# Patient Record
Sex: Male | Born: 1965
Health system: Southern US, Community
[De-identification: ages and names within clinical notes are randomized; demographics above are authoritative.]

## PROBLEM LIST (undated history)

## (undated) DIAGNOSIS — D696 Thrombocytopenia, unspecified: Secondary | ICD-10-CM

## (undated) DIAGNOSIS — M5416 Radiculopathy, lumbar region: Secondary | ICD-10-CM

## (undated) DIAGNOSIS — U071 COVID-19: Secondary | ICD-10-CM

## (undated) DIAGNOSIS — I77810 Thoracic aortic ectasia: Secondary | ICD-10-CM

## (undated) DIAGNOSIS — N138 Other obstructive and reflux uropathy: Secondary | ICD-10-CM

## (undated) DIAGNOSIS — M419 Scoliosis, unspecified: Secondary | ICD-10-CM

## (undated) DIAGNOSIS — C439 Malignant melanoma of skin, unspecified: Secondary | ICD-10-CM

## (undated) DIAGNOSIS — N401 Enlarged prostate with lower urinary tract symptoms: Secondary | ICD-10-CM

## (undated) DIAGNOSIS — E559 Vitamin D deficiency, unspecified: Secondary | ICD-10-CM

## (undated) DIAGNOSIS — R591 Generalized enlarged lymph nodes: Secondary | ICD-10-CM

## (undated) DIAGNOSIS — I493 Ventricular premature depolarization: Secondary | ICD-10-CM

## (undated) DIAGNOSIS — R0600 Dyspnea, unspecified: Secondary | ICD-10-CM

## (undated) DIAGNOSIS — E538 Deficiency of other specified B group vitamins: Secondary | ICD-10-CM

## (undated) DIAGNOSIS — R001 Bradycardia, unspecified: Secondary | ICD-10-CM

## (undated) DIAGNOSIS — R002 Palpitations: Secondary | ICD-10-CM

## (undated) HISTORY — PX: HERNIA REPAIR: SHX51

## (undated) HISTORY — PX: BACK SURGERY: SHX140

## (undated) HISTORY — PX: TONSILLECTOMY: SUR1361

## (undated) HISTORY — PX: MELANOMA EXCISION: SHX5266

## (undated) HISTORY — PX: LAMINECTOMY: SHX219

## (undated) HISTORY — DX: Malignant melanoma of skin, unspecified: C43.9

## (undated) HISTORY — PX: CATARACT EXTRACTION, BILATERAL: SHX1313

---

## 1993-06-16 DIAGNOSIS — C439 Malignant melanoma of skin, unspecified: Secondary | ICD-10-CM

## 1993-06-16 HISTORY — DX: Malignant melanoma of skin, unspecified: C43.9

## 2002-02-15 DIAGNOSIS — K219 Gastro-esophageal reflux disease without esophagitis: Secondary | ICD-10-CM | POA: Insufficient documentation

## 2003-01-26 ENCOUNTER — Ambulatory Visit (HOSPITAL_COMMUNITY): Admission: RE | Admit: 2003-01-26 | Discharge: 2003-01-27 | Payer: Self-pay | Admitting: Neurosurgery

## 2004-03-22 ENCOUNTER — Ambulatory Visit (HOSPITAL_COMMUNITY): Admission: RE | Admit: 2004-03-22 | Discharge: 2004-03-22 | Payer: Self-pay | Admitting: Neurosurgery

## 2004-03-28 ENCOUNTER — Ambulatory Visit (HOSPITAL_COMMUNITY): Admission: RE | Admit: 2004-03-28 | Discharge: 2004-03-29 | Payer: Self-pay | Admitting: Neurosurgery

## 2004-12-06 ENCOUNTER — Ambulatory Visit: Payer: Self-pay | Admitting: Unknown Physician Specialty

## 2006-07-09 DIAGNOSIS — D239 Other benign neoplasm of skin, unspecified: Secondary | ICD-10-CM

## 2006-07-09 HISTORY — DX: Other benign neoplasm of skin, unspecified: D23.9

## 2008-01-13 ENCOUNTER — Ambulatory Visit: Payer: Self-pay | Admitting: Unknown Physician Specialty

## 2013-04-27 LAB — HM COLONOSCOPY

## 2013-10-17 DIAGNOSIS — Z125 Encounter for screening for malignant neoplasm of prostate: Secondary | ICD-10-CM | POA: Insufficient documentation

## 2013-10-17 DIAGNOSIS — Z1211 Encounter for screening for malignant neoplasm of colon: Secondary | ICD-10-CM | POA: Insufficient documentation

## 2013-10-17 DIAGNOSIS — Z0000 Encounter for general adult medical examination without abnormal findings: Secondary | ICD-10-CM | POA: Insufficient documentation

## 2014-01-11 ENCOUNTER — Ambulatory Visit (INDEPENDENT_AMBULATORY_CARE_PROVIDER_SITE_OTHER): Payer: BC Managed Care – PPO

## 2014-01-11 ENCOUNTER — Encounter: Payer: Self-pay | Admitting: Podiatry

## 2014-01-11 ENCOUNTER — Ambulatory Visit (INDEPENDENT_AMBULATORY_CARE_PROVIDER_SITE_OTHER): Payer: BC Managed Care – PPO | Admitting: Podiatry

## 2014-01-11 VITALS — BP 135/65 | HR 60 | Resp 16 | Ht 76.0 in | Wt 194.0 lb

## 2014-01-11 DIAGNOSIS — M722 Plantar fascial fibromatosis: Secondary | ICD-10-CM

## 2014-01-11 MED ORDER — METHYLPREDNISOLONE (PAK) 4 MG PO TABS: ORAL_TABLET | ORAL | Status: DC

## 2014-01-11 MED ORDER — MELOXICAM 15 MG PO TABS
15.0000 mg | ORAL_TABLET | Freq: Every day | ORAL | Status: DC
Start: 2014-01-11 — End: 2014-05-01

## 2014-01-11 NOTE — Patient Instructions (Signed)
Plantar Fasciitis (Heel Spur Syndrome) with Rehab The plantar fascia is a fibrous, ligament-like, soft-tissue structure that spans the bottom of the foot. Plantar fasciitis is a condition that causes pain in the foot due to inflammation of the tissue. SYMPTOMS   Pain and tenderness on the underneath side of the foot.  Pain that worsens with standing or walking. CAUSES  Plantar fasciitis is caused by irritation and injury to the plantar fascia on the underneath side of the foot. Common mechanisms of injury include:  Direct trauma to bottom of the foot.  Damage to a small nerve that runs under the foot where the main fascia attaches to the heel bone.  Stress placed on the plantar fascia due to bone spurs. RISK INCREASES WITH:   Activities that place stress on the plantar fascia (running, jumping, pivoting, or cutting).  Poor strength and flexibility.  Improperly fitted shoes.  Tight calf muscles.  Flat feet.  Failure to warm-up properly before activity.  Obesity. PREVENTION  Warm up and stretch properly before activity.  Allow for adequate recovery between workouts.  Maintain physical fitness:  Strength, flexibility, and endurance.  Cardiovascular fitness.  Maintain a health body weight.  Avoid stress on the plantar fascia.  Wear properly fitted shoes, including arch supports for individuals who have flat feet. PROGNOSIS  If treated properly, then the symptoms of plantar fasciitis usually resolve without surgery. However, occasionally surgery is necessary. RELATED COMPLICATIONS   Recurrent symptoms that may result in a chronic condition.  Problems of the lower back that are caused by compensating for the injury, such as limping.  Pain or weakness of the foot during push-off following surgery.  Chronic inflammation, scarring, and partial or complete fascia tear, occurring more often from repeated injections. TREATMENT  Treatment initially involves the use of  ice and medication to help reduce pain and inflammation. The use of strengthening and stretching exercises may help reduce pain with activity, especially stretches of the Achilles tendon. These exercises may be performed at home or with a therapist. Your caregiver may recommend that you use heel cups of arch supports to help reduce stress on the plantar fascia. Occasionally, corticosteroid injections are given to reduce inflammation. If symptoms persist for greater than 6 months despite non-surgical (conservative), then surgery may be recommended.  MEDICATION   If pain medication is necessary, then nonsteroidal anti-inflammatory medications, such as aspirin and ibuprofen, or other minor pain relievers, such as acetaminophen, are often recommended.  Do not take pain medication within 7 days before surgery.  Prescription pain relievers may be given if deemed necessary by your caregiver. Use only as directed and only as much as you need.  Corticosteroid injections may be given by your caregiver. These injections should be reserved for the most serious cases, because they may only be given a certain number of times. HEAT AND COLD  Cold treatment (icing) relieves pain and reduces inflammation. Cold treatment should be applied for 10 to 15 minutes every 2 to 3 hours for inflammation and pain and immediately after any activity that aggravates your symptoms. Use ice packs or massage the area with a piece of ice (ice massage).  Heat treatment may be used prior to performing the stretching and strengthening activities prescribed by your caregiver, physical therapist, or athletic trainer. Use a heat pack or soak the injury in warm water. SEEK IMMEDIATE MEDICAL CARE IF:  Treatment seems to offer no benefit, or the condition worsens.  Any medications produce adverse side effects. EXERCISES RANGE   OF MOTION (ROM) AND STRETCHING EXERCISES - Plantar Fasciitis (Heel Spur Syndrome) These exercises may help you  when beginning to rehabilitate your injury. Your symptoms may resolve with or without further involvement from your physician, physical therapist or athletic trainer. While completing these exercises, remember:   Restoring tissue flexibility helps normal motion to return to the joints. This allows healthier, less painful movement and activity.  An effective stretch should be held for at least 30 seconds.  A stretch should never be painful. You should only feel a gentle lengthening or release in the stretched tissue. RANGE OF MOTION - Toe Extension, Flexion  Sit with your right / left leg crossed over your opposite knee.  Grasp your toes and gently pull them back toward the top of your foot. You should feel a stretch on the bottom of your toes and/or foot.  Hold this stretch for __________ seconds.  Now, gently pull your toes toward the bottom of your foot. You should feel a stretch on the top of your toes and or foot.  Hold this stretch for __________ seconds. Repeat __________ times. Complete this stretch __________ times per day.  RANGE OF MOTION - Ankle Dorsiflexion, Active Assisted  Remove shoes and sit on a chair that is preferably not on a carpeted surface.  Place right / left foot under knee. Extend your opposite leg for support.  Keeping your heel down, slide your right / left foot back toward the chair until you feel a stretch at your ankle or calf. If you do not feel a stretch, slide your bottom forward to the edge of the chair, while still keeping your heel down.  Hold this stretch for __________ seconds. Repeat __________ times. Complete this stretch __________ times per day.  STRETCH - Gastroc, Standing  Place hands on wall.  Extend right / left leg, keeping the front knee somewhat bent.  Slightly point your toes inward on your back foot.  Keeping your right / left heel on the floor and your knee straight, shift your weight toward the wall, not allowing your back to  arch.  You should feel a gentle stretch in the right / left calf. Hold this position for __________ seconds. Repeat __________ times. Complete this stretch __________ times per day. STRETCH - Soleus, Standing  Place hands on wall.  Extend right / left leg, keeping the other knee somewhat bent.  Slightly point your toes inward on your back foot.  Keep your right / left heel on the floor, bend your back knee, and slightly shift your weight over the back leg so that you feel a gentle stretch deep in your back calf.  Hold this position for __________ seconds. Repeat __________ times. Complete this stretch __________ times per day. STRETCH - Gastrocsoleus, Standing  Note: This exercise can place a lot of stress on your foot and ankle. Please complete this exercise only if specifically instructed by your caregiver.   Place the ball of your right / left foot on a step, keeping your other foot firmly on the same step.  Hold on to the wall or a rail for balance.  Slowly lift your other foot, allowing your body weight to press your heel down over the edge of the step.  You should feel a stretch in your right / left calf.  Hold this position for __________ seconds.  Repeat this exercise with a slight bend in your right / left knee. Repeat __________ times. Complete this stretch __________ times per day.    STRENGTHENING EXERCISES - Plantar Fasciitis (Heel Spur Syndrome)  These exercises may help you when beginning to rehabilitate your injury. They may resolve your symptoms with or without further involvement from your physician, physical therapist or athletic trainer. While completing these exercises, remember:   Muscles can gain both the endurance and the strength needed for everyday activities through controlled exercises.  Complete these exercises as instructed by your physician, physical therapist or athletic trainer. Progress the resistance and repetitions only as guided. STRENGTH -  Towel Curls  Sit in a chair positioned on a non-carpeted surface.  Place your foot on a towel, keeping your heel on the floor.  Pull the towel toward your heel by only curling your toes. Keep your heel on the floor.  If instructed by your physician, physical therapist or athletic trainer, add ____________________ at the end of the towel. Repeat __________ times. Complete this exercise __________ times per day. STRENGTH - Ankle Inversion  Secure one end of a rubber exercise band/tubing to a fixed object (table, pole). Loop the other end around your foot just before your toes.  Place your fists between your knees. This will focus your strengthening at your ankle.  Slowly, pull your big toe up and in, making sure the band/tubing is positioned to resist the entire motion.  Hold this position for __________ seconds.  Have your muscles resist the band/tubing as it slowly pulls your foot back to the starting position. Repeat __________ times. Complete this exercises __________ times per day.  Document Released: 06/02/2005 Document Revised: 08/25/2011 Document Reviewed: 09/14/2008 [employer] Patient Information 2015 [employer]. This information is not intended to replace advice given to you by your health care provider. Make sure you discuss any questions you have with your health care provider. Plantar Fasciitis Plantar fasciitis is a common condition that causes foot pain. It is soreness (inflammation) of the band of tough fibrous tissue on the bottom of the foot that runs from the heel bone (calcaneus) to the ball of the foot. The cause of this soreness may be from excessive standing, poor fitting shoes, running on hard surfaces, being overweight, having an abnormal walk, or overuse (this is common in runners) of the painful foot or feet. It is also common in aerobic exercise dancers and ballet dancers. SYMPTOMS  Most people with plantar fasciitis complain of:  Severe pain in the morning  on the bottom of their foot especially when taking the first steps out of bed. This pain recedes after a few minutes of walking.  Severe pain is experienced also during walking following a long period of inactivity.  Pain is worse when walking barefoot or up stairs DIAGNOSIS   Your caregiver will diagnose this condition by examining and feeling your foot.  Special tests such as X-rays of your foot, are usually not needed. PREVENTION   Consult a sports medicine professional before beginning a new exercise program.  Walking programs offer a good workout. With walking there is a lower chance of overuse injuries common to runners. There is less impact and less jarring of the joints.  Begin all new exercise programs slowly. If problems or pain develop, decrease the amount of time or distance until you are at a comfortable level.  Wear good shoes and replace them regularly.  Stretch your foot and the heel cords at the back of the ankle (Achilles tendon) both before and after exercise.  Run or exercise on even surfaces that are not hard. For example, asphalt is better than   pavement.  Do not run barefoot on hard surfaces.  If using a treadmill, vary the incline.  Do not continue to workout if you have foot or joint problems. Seek professional help if they do not improve. HOME CARE INSTRUCTIONS   Avoid activities that cause you pain until you recover.  Use ice or cold packs on the problem or painful areas after working out.  Only take over-the-counter or prescription medicines for pain, discomfort, or fever as directed by your caregiver.  Soft shoe inserts or athletic shoes with air or gel sole cushions may be helpful.  If problems continue or become more severe, consult a sports medicine caregiver or your own health care provider. Cortisone is a potent anti-inflammatory medication that may be injected into the painful area. You can discuss this treatment with your caregiver. MAKE SURE  YOU:   Understand these instructions.  Will watch your condition.  Will get help right away if you are not doing well or get worse. Document Released: 02/25/2001 Document Revised: 08/25/2011 Document Reviewed: 04/26/2008 [employer] Patient Information 2015 [employer]. This information is not intended to replace advice given to you by your health care provider. Make sure you discuss any questions you have with your health care provider.  

## 2014-01-11 NOTE — Progress Notes (Signed)
   Subjective:    Patient ID: Paul Pruitt, male    DOB: [birth year], 48 y.o.   MRN: [id number]  HPI Comments: i have right heel pain. i have had it for 1 year off and on. Its getting worse. i hurts when exercising. i take ibuprofen for the pain. i went to urgent care on 6.7.15 and they took x-rays, said i have plantar fasciitis. They told me to get new shoes, take it easy for a while and no running on the treadmill. i massage my foot with a tennis ball.   Foot Pain      Review of Systems  All other systems reviewed and are negative.      Objective:   Physical Exam: I have reviewed his past medical history medications allergies surgeries social history and review of systems. Pulses are strongly palpable bilateral. Neurologic sensorium is intact per since once the monofilament. Deep tendon reflexes are intact bilateral and muscle strength is 5 over 5 dorsiflexors plantar flexors inverters everters all intrinsic musculature is intact. Orthopedic evaluation demonstrates all joints distal to the ankle a full range of motion without crepitation. He has pain on palpation medial continued tubercle of the right heel. Radiographic evaluation demonstrates soft tissue increase in density at the plantar fascial calcaneal insertion site indicative of plantar fasciitis. Cutaneous evaluation demonstrates supple while hydrated cutis no erythema edema cellulitis drainage or odor.        Assessment & Plan:  Assessment: Plantar fasciitis right foot.  Plan: Discussed etiology pathology conservative versus surgical therapies. At this point started him a Medrol Dosepak to be followed by meloxicam. Injected his right heel with Kenalog and local anesthetic. Put him in a plantar fascial strapping and a night splint. Discussed appropriate shoe gear stretching exercises ice therapy and shoe gear modifications followup with him in one month.

## 2014-02-06 ENCOUNTER — Ambulatory Visit (INDEPENDENT_AMBULATORY_CARE_PROVIDER_SITE_OTHER): Payer: BC Managed Care – PPO | Admitting: Podiatry

## 2014-02-06 VITALS — BP 125/72 | HR 73 | Resp 16

## 2014-02-06 DIAGNOSIS — M722 Plantar fascial fibromatosis: Secondary | ICD-10-CM

## 2014-02-06 NOTE — Progress Notes (Signed)
He presents today for followup of his plantar fasciitis of his right heel. He states it is anywhere from 60-90% better depending a one-day you ask me. He is very eager to get back to his running and training for marathons is coming up in the spring. He states that he is continuing all of his conservative therapy a regular basis. He states that he is not cheating.  Objective: Pain on palpation medial continued tubercle of the right heel.  Assessment: Plantar fasciitis right.  Plan: Reinjected today with Kenalog and local anesthetic. Dispensed another fascial brace. He was scanned for a pair orthotics. And he will continue all other conservative therapies.

## 2014-02-23 ENCOUNTER — Encounter: Payer: Self-pay | Admitting: *Deleted

## 2014-02-23 NOTE — Progress Notes (Signed)
Called and left message and sent pt post card letting him know orthotics are here.

## 2014-02-27 ENCOUNTER — Encounter: Payer: Self-pay | Admitting: Podiatry

## 2014-02-27 ENCOUNTER — Ambulatory Visit (INDEPENDENT_AMBULATORY_CARE_PROVIDER_SITE_OTHER): Payer: BC Managed Care – PPO | Admitting: *Deleted

## 2014-02-27 VITALS — BP 125/72 | HR 73 | Resp 16

## 2014-02-27 DIAGNOSIS — M722 Plantar fascial fibromatosis: Secondary | ICD-10-CM

## 2014-02-27 NOTE — Progress Notes (Signed)
Pt presents to pick up orthotics. Went over wearing instructions.

## 2014-02-27 NOTE — Patient Instructions (Signed)

## 2014-04-03 ENCOUNTER — Ambulatory Visit: Payer: BC Managed Care – PPO | Admitting: Podiatry

## 2014-05-01 ENCOUNTER — Ambulatory Visit (INDEPENDENT_AMBULATORY_CARE_PROVIDER_SITE_OTHER): Payer: BC Managed Care – PPO | Admitting: Podiatry

## 2014-05-01 ENCOUNTER — Ambulatory Visit (INDEPENDENT_AMBULATORY_CARE_PROVIDER_SITE_OTHER): Payer: BC Managed Care – PPO

## 2014-05-01 VITALS — BP 125/72 | HR 66 | Resp 16

## 2014-05-01 DIAGNOSIS — M779 Enthesopathy, unspecified: Secondary | ICD-10-CM

## 2014-05-01 MED ORDER — MELOXICAM 15 MG PO TABS: 15.0000 mg | ORAL_TABLET | Freq: Every day | ORAL | Status: DC

## 2014-05-01 NOTE — Progress Notes (Signed)
Paul Pruitt presents today with chief complaint of pain to the dorsal aspect of his right foot. He states it is been bothering him for the past few weeks seems to be worse with shoes after his been running.  Objective: Vital signs are stable he is alert and oriented 3 pulses are strongly palpable bilateral lower extremity. Mild area of erythema without an increase in temperature overlying the extensor hallucis longus tendon over a prominent first metatarsal base. Radiographs confirmed this and he does have tenderness on dorsiflexion against resistance.  Assessment: EHL tendinitis dorsal aspect right foot.  Plan: Injected with dexamethasone. Discussed appropriate shoe gear stretching exercises and shoe gear modifications discussed ice therapy.

## 2014-11-21 DIAGNOSIS — E039 Hypothyroidism, unspecified: Secondary | ICD-10-CM

## 2014-11-21 DIAGNOSIS — Z860100 Personal history of colon polyps, unspecified: Secondary | ICD-10-CM | POA: Insufficient documentation

## 2014-11-21 DIAGNOSIS — R079 Chest pain, unspecified: Secondary | ICD-10-CM

## 2014-11-21 DIAGNOSIS — E559 Vitamin D deficiency, unspecified: Secondary | ICD-10-CM

## 2014-11-21 DIAGNOSIS — R351 Nocturia: Secondary | ICD-10-CM

## 2014-11-21 DIAGNOSIS — F419 Anxiety disorder, unspecified: Secondary | ICD-10-CM | POA: Insufficient documentation

## 2014-11-22 ENCOUNTER — Encounter: Payer: Self-pay | Admitting: Physician Assistant

## 2014-11-22 ENCOUNTER — Other Ambulatory Visit: Payer: Self-pay

## 2014-11-22 ENCOUNTER — Ambulatory Visit (INDEPENDENT_AMBULATORY_CARE_PROVIDER_SITE_OTHER): Payer: BLUE CROSS/BLUE SHIELD | Admitting: Physician Assistant

## 2014-11-22 VITALS — BP 122/78 | HR 54 | Temp 98.0°F | Resp 14 | Wt 195.8 lb

## 2014-11-22 DIAGNOSIS — R350 Frequency of micturition: Secondary | ICD-10-CM | POA: Diagnosis not present

## 2014-11-22 DIAGNOSIS — N410 Acute prostatitis: Secondary | ICD-10-CM | POA: Diagnosis not present

## 2014-11-22 LAB — POCT URINALYSIS DIPSTICK
Bilirubin, UA: NEGATIVE
Blood, UA: NEGATIVE
Glucose, UA: NEGATIVE
Ketones, UA: NEGATIVE
Leukocytes, UA: NEGATIVE
Nitrite, UA: NEGATIVE
Protein, UA: NEGATIVE
Spec Grav, UA: 1.015
Urobilinogen, UA: 0.2
pH, UA: 7

## 2014-11-22 MED ORDER — DOXYCYCLINE HYCLATE 100 MG PO TABS: 100.0000 mg | ORAL_TABLET | Freq: Two times a day (BID) | ORAL | Status: DC

## 2014-11-22 NOTE — Patient Instructions (Signed)

## 2014-11-22 NOTE — Progress Notes (Signed)
   Subjective:    Patient ID: Paul Pruitt, male    DOB: 09-03-1965, 49 y.o.   MRN: [id number]  Urinary Frequency  This is a recurrent problem. The current episode started in the past 7 days. The problem occurs intermittently. The problem has been gradually improving. The quality of the pain is described as burning. The pain is at a severity of 0/10. The patient is experiencing no pain. There has been no fever. He is sexually active. There is no history of pyelonephritis. Associated symptoms include frequency and hesitancy. Pertinent negatives include no chills, discharge, flank pain, hematuria, nausea, possible pregnancy, sweats, urgency or vomiting. He has tried nothing for the symptoms. There is no history of catheterization, kidney stones, recurrent UTIs, a single kidney, urinary stasis or a urological procedure. h/o prostatitis in 07/2014      Review of Systems  Constitutional: Negative for fever and chills.  Gastrointestinal: Negative for nausea and vomiting.  Genitourinary: Positive for hesitancy, frequency and difficulty urinating (hesitant stream and low flow). Negative for dysuria, urgency, hematuria, flank pain, discharge, penile swelling, scrotal swelling, genital sores, penile pain and testicular pain.       Objective:   Physical Exam  Constitutional: He appears well-developed and well-nourished. No distress.  Cardiovascular: Normal rate, regular rhythm and normal heart sounds.  Exam reveals no gallop and no friction rub.   No murmur heard. Pulmonary/Chest: Effort normal and breath sounds normal. No respiratory distress. He has no wheezes. He has no rales. He exhibits no tenderness.  Genitourinary:  Pt refused DRE stating he had physical in May and everything was ok and had his PSA checked as well.  Skin: Skin is warm and dry.          Assessment & Plan:  1. Frequency of urination  - POCT urinalysis dipstick  2. Acute prostatitis without hematuria Recurrent.   Previously noted in 07/2014 and treated with doxycycline with improvement.  He does report nocturia on a regular basis with having to get up once per night, but recently he has been getting up 3-4 times per night.  He states symptoms are exact same as 07/2014.  Discussed possible BPH and treatment for that.  We also discussed urology referral for peak flow testing and PVR testing.  He would like to hold off on those treatments and recommendations at this time unless symptoms recur or do not improve with treatment this time.  - doxycycline (VIBRA-TABS) 100 MG tablet; Take 1 tablet (100 mg total) by mouth 2 (two) times daily.  Dispense: 20 tablet; Refill: 0

## 2014-12-01 ENCOUNTER — Ambulatory Visit (INDEPENDENT_AMBULATORY_CARE_PROVIDER_SITE_OTHER): Payer: BLUE CROSS/BLUE SHIELD | Admitting: Physician Assistant

## 2014-12-01 ENCOUNTER — Encounter: Payer: Self-pay | Admitting: Physician Assistant

## 2014-12-01 VITALS — BP 104/62 | HR 58 | Temp 97.9°F | Resp 14 | Wt 196.4 lb

## 2014-12-01 DIAGNOSIS — R351 Nocturia: Secondary | ICD-10-CM | POA: Diagnosis not present

## 2014-12-01 DIAGNOSIS — N50819 Testicular pain, unspecified: Secondary | ICD-10-CM

## 2014-12-01 DIAGNOSIS — R3919 Other difficulties with micturition: Secondary | ICD-10-CM | POA: Diagnosis not present

## 2014-12-01 DIAGNOSIS — N508 Other specified disorders of male genital organs: Secondary | ICD-10-CM

## 2014-12-01 DIAGNOSIS — R3911 Hesitancy of micturition: Secondary | ICD-10-CM

## 2014-12-01 DIAGNOSIS — R39198 Other difficulties with micturition: Secondary | ICD-10-CM

## 2014-12-01 NOTE — Patient Instructions (Signed)
Benign Prostatic Hyperplasia An enlarged prostate (benign prostatic hyperplasia) is common in older men. You may experience the following:  Weak urine stream.  Dribbling.  Feeling like the bladder has not emptied completely.  Difficulty starting urination.  Getting up frequently at night to urinate.  Urinating more frequently during the day. HOME CARE INSTRUCTIONS  Monitor your prostatic hyperplasia for any changes. The following actions may help to alleviate any discomfort you are experiencing:  Give yourself time when you urinate.  Stay away from alcohol.  Avoid beverages containing caffeine, such as coffee, tea, and colas, because they can make the problem worse.  Avoid decongestants, antihistamines, and some prescription medicines that can make the problem worse.  Follow up with your health care provider for further treatment as recommended. SEEK MEDICAL CARE IF:  You are experiencing progressive difficulty voiding.  Your urine stream is progressively getting narrower.  You are awaking from sleep with the urge to void more frequently.  You are constantly feeling the need to void.  You experience loss of urine, especially in small amounts. SEEK IMMEDIATE MEDICAL CARE IF:   You develop increased pain with urination or are unable to urinate.  You develop severe abdominal pain, vomiting, a high fever, or fainting.  You develop back pain or blood in your urine. MAKE SURE YOU:   Understand these instructions.  Will watch your condition.  Will get help right away if you are not doing well or get worse. Document Released: 06/02/2005 Document Revised: 02/02/2013 Document Reviewed: 11/02/2012 [employer] Patient Information 2015 [employer]. This information is not intended to replace advice given to you by your health care provider. Make sure you discuss any questions you have with your health care provider.  

## 2014-12-01 NOTE — Progress Notes (Signed)
   Subjective:    Patient ID: Paul Pruitt, male    DOB: Jul 31, 1965, 49 y.o.   MRN: [id number]  HPI Comments: On Monday he attended an exercise class and states that they had to do a lot of jumping jacks.  His testicles became sore after that.  Wednesday he states was the worst day, but today it is not as bad and he was even able to go on his morning run today.  Testicle Pain The patient's primary symptoms include testicular pain. The patient's pertinent negatives include no genital injury, genital itching, genital lesions, penile discharge, penile pain, priapism or scrotal swelling. This is a new problem. The current episode started in the past 7 days. The problem occurs constantly. The problem has been gradually improving. The pain is mild. Associated symptoms include frequency and hesitancy. Pertinent negatives include no abdominal pain, anorexia, chest pain, chills, constipation, coughing, diarrhea, discolored urine, dysuria, fever, flank pain, headaches, hematuria, joint pain, joint swelling, nausea, painful intercourse, rash, shortness of breath, sore throat, urgency, urinary retention or vomiting. The testicular pain affects both testicles. The color of the testicles is normal. The symptoms are aggravated by activity. He has tried rest for the symptoms. The treatment provided mild relief. He is sexually active. He never uses condoms. No, his partner does not have an STD. His past medical history is significant for prostatitis. There is no history of BPH, chlamydia, cryptorchidism, erectile aid use, erectile dysfunction, a femoral hernia, gonorrhea, herpes simplex, HIV, an inguinal hernia, kidney stones, sickle cell disease, syphilis or varicocele.      Review of Systems  Constitutional: Negative for fever and chills.  HENT: Negative for sore throat.   Respiratory: Negative for cough and shortness of breath.   Cardiovascular: Negative for chest pain.  Gastrointestinal: Negative for nausea,  vomiting, abdominal pain, diarrhea, constipation and anorexia.  Genitourinary: Positive for hesitancy, frequency and testicular pain. Negative for dysuria, urgency, flank pain, discharge, scrotal swelling and penile pain.  Musculoskeletal: Negative for joint pain.  Skin: Negative for rash.  Neurological: Negative for headaches.       Objective:   Physical Exam  Constitutional: He appears well-developed and well-nourished. No distress.  Skin: He is not diaphoretic.  Vitals reviewed.         Assessment & Plan:  1. Testicular pain Most likely secondary from the jumping without support.  Advised to rest, continue compression shorts, moist heat, and ibuprofen prn.  If no improvement he is to call the office.  2. Nocturia Had thought it was subacute prostatitis as he has had this before and was successfully treated with doxycycline.  But symptoms are persistent.  BPH is highly suspected but patient states he had physical one month ago and was told everything was fine.  Would like referral for further evaluation. - Ambulatory referral to Urology  3. Urinary hesitancy See plan for nocturia. - Ambulatory referral to Urology  4. Decreased urine stream See plan for nocturia. - Ambulatory referral to Urology

## 2014-12-21 ENCOUNTER — Encounter: Payer: Self-pay | Admitting: Urology

## 2014-12-21 ENCOUNTER — Ambulatory Visit (INDEPENDENT_AMBULATORY_CARE_PROVIDER_SITE_OTHER): Payer: BLUE CROSS/BLUE SHIELD | Admitting: Urology

## 2014-12-21 VITALS — BP 108/65 | HR 55 | Ht 76.0 in | Wt 195.2 lb

## 2014-12-21 DIAGNOSIS — N50811 Right testicular pain: Secondary | ICD-10-CM

## 2014-12-21 DIAGNOSIS — N508 Other specified disorders of male genital organs: Secondary | ICD-10-CM

## 2014-12-21 DIAGNOSIS — N401 Enlarged prostate with lower urinary tract symptoms: Secondary | ICD-10-CM | POA: Diagnosis not present

## 2014-12-21 DIAGNOSIS — N138 Other obstructive and reflux uropathy: Secondary | ICD-10-CM

## 2014-12-21 LAB — URINALYSIS, COMPLETE
Bilirubin, UA: NEGATIVE
Glucose, UA: NEGATIVE
Ketones, UA: NEGATIVE
Leukocytes, UA: NEGATIVE
Nitrite, UA: NEGATIVE
Protein, UA: NEGATIVE
RBC, UA: NEGATIVE
Specific Gravity, UA: 1.025 (ref 1.005–1.030)
Urobilinogen, Ur: 0.2 mg/dL (ref 0.2–1.0)
pH, UA: 6 (ref 5.0–7.5)

## 2014-12-21 LAB — MICROSCOPIC EXAMINATION: Bacteria, UA: NONE SEEN

## 2014-12-21 LAB — BLADDER SCAN AMB NON-IMAGING: Scan Result: 0

## 2014-12-21 NOTE — Progress Notes (Signed)
12/21/2014 11:13 AM   Paul Pruitt [birth year] [id number]  Referring provider: Jerrol Pruitt., MD [address] [place], [place] [address]  Chief Complaint  Patient presents with  . Nocturia    referred from Dr. Dimple Pruitt [facility]  . Urinary hesitancy  . Testicle Pain    HPI: Mr. Paul Pruitt is a 49 year old white male who is heard by his PCP, Dr. Rosanna Pruitt, for urinary hesitancy and right testicular pain.  Patient states since the beginning of the year he's had 2 episodes of right testicular pain. He does not like to describe it as a pain, but more like an increased sensitivity or discomfort.  The pain occurs mostly after intense exercise. He does usually wear supportive  underwear and that does help with the discomfort. He has not noticed any scrotal swelling or masses. He does not have a history of scrotal trauma, scrotal surgery or infection in the epididymis or testicles.  He is not having pain with ejaculation.  He is in a monogamous relationship.  He has also had a long time symptoms of frequent urination, nocturia, hesitancy and a weak urinary stream. His 3 fluids are water, milk and coffee. He mostly drinks coffee, black. He has not had any gross hematuria, suprapubic pain, dysuria or penile discharge. He does admit to drinking a fair amount of fluids in the evening. He did stop drinking fluids after 6:00 for a time, but he saw no improvement in his nocturia. Sometimes,  he wakes up 3 times a night.  He is not always awoken by the urge to urinate, sometimes his dogs wake him up or other events.  His PVR is 0 mL.  His UA is unremarkable on today's exam.   He has had 2 episodes of prostatitis which  responded to doxycycline.  He does admit to having a very stressful life, but he exercises frequently and this helps relieve most of his stress.   PMH: No past medical history on file.  Surgical History: Past Surgical History  Procedure Laterality Date    . Hernia repair    . Laminectomy      x 2  . Tonsillectomy    . Melanoma excision      chest area    Home Medications:    Medication List       This list is accurate as of: 12/21/14 11:13 AM.  Always use your most recent med list.               doxycycline 100 MG tablet  Commonly known as:  VIBRA-TABS  Take 1 tablet (100 mg total) by mouth 2 (two) times daily.     ibuprofen 800 MG tablet  Commonly known as:  ADVIL,MOTRIN     levothyroxine 25 MCG tablet  Commonly known as:  SYNTHROID, LEVOTHROID     MULTIVITAMIN & MINERAL Liqd  Take by mouth.     OMEGA-3 FATTY ACIDS PO  Take 2 capsules by mouth daily.        Allergies: No Known Allergies  Family History: Family History  Problem Relation Age of Onset  . Arthritis Mother   . Colon cancer Mother   . Hypertension Father   . Asthma Father   . Hypertension Brother   . Obesity Brother   . Asthma Son   . Obesity Son   . Kidney disease Neg Hx   . Prostate cancer Neg Hx     Social History:  reports  that he has never smoked. He does not have any smokeless tobacco history on file. He reports that he drinks alcohol. He reports that he does not use illicit drugs.  ROS: Urological Symptom Review  Patient is experiencing the following symptoms: Frequent urination Get up at night to urinate Trouble starting stream Weak stream   Review of Systems  Gastrointestinal (upper)  : Negative for upper GI symptoms  Gastrointestinal (lower) : Negative for lower GI symptoms  Constitutional : Negative for symptoms  Skin: Negative for skin symptoms  Eyes: Negative for eye symptoms  Ear/Nose/Throat : Negative for Ear/Nose/Throat symptoms  Hematologic/Lymphatic: Negative for Hematologic/Lymphatic symptoms  Cardiovascular : Negative for cardiovascular symptoms  Respiratory : Shortness of breath  Endocrine: Negative for endocrine symptoms  Musculoskeletal: Back  pain  Neurological: Headaches  Psychologic: Negative for psychiatric symptoms   Physical Exam: BP 108/65 mmHg  Pulse 55  Ht 6\' 4"  (1.93 m)  Wt 195 lb 3.2 oz (88.542 kg)  BMI 23.77 kg/m2  Constitutional:  Alert and oriented, No acute distress. HEENT: [place] AT, moist mucus membranes.  Trachea midline, no masses. Cardiovascular: No clubbing, cyanosis, or edema. Respiratory: Normal respiratory effort, no increased work of breathing. GI: Abdomen is soft, nontender, nondistended, no abdominal masses GU: Patient with circumcised phallus.   Urethral meatus is patent.  No penile discharge. No penile lesions or rashes. Scrotum without lesions, cysts, rashes and/or edema.  Testicles are located scrotally bilaterally. No masses are appreciated in the testicles. Left and right epididymis are normal.  Left varicocele noted. Rectal: Patient with  normal sphincter tone. Perineum without scarring or rashes. No rectal masses are appreciated. Prostate is approximately 50 grams, no nodules are appreciated. Seminal vesicles are normal. Skin: No rashes, bruises or suspicious lesions. Lymph: No cervical or inguinal adenopathy. Neurologic: Grossly intact, no focal deficits, moving all 4 extremities. Psychiatric: Normal mood and affect.  Laboratory Data: Results for orders placed or performed in visit on 12/21/14  Microscopic Examination  Result Value Ref Range   WBC, UA 0-5 0 -  5 /hpf   RBC, UA 0-2 0 -  2 /hpf   Epithelial Cells (non renal) 0-10 0 - 10 /hpf   Bacteria, UA None seen None seen/Few  Urinalysis, Complete  Result Value Ref Range   Specific Gravity, UA 1.025 1.005 - 1.030   pH, UA 6.0 5.0 - 7.5   Color, UA Yellow Yellow   Appearance Ur Clear Clear   Leukocytes, UA Negative Negative   Protein, UA Negative Negative/Trace   Glucose, UA Negative Negative   Ketones, UA Negative Negative   RBC, UA Negative Negative   Bilirubin, UA Negative Negative   Urobilinogen, Ur 0.2 0.2 - 1.0 mg/dL    Nitrite, UA Negative Negative   Microscopic Examination See below:   BLADDER SCAN AMB NON-IMAGING  Result Value Ref Range   Scan Result 0    No results found for: WBC, HGB, HCT, MCV, PLT  No results found for: CREATININE  No results found for: PSA  No results found for: TESTOSTERONE  No results found for: HGBA1C  Urinalysis    Component Value Date/Time   BILIRUBINUR neg 11/22/2014 1018   PROTEINUR neg 11/22/2014 1018   UROBILINOGEN 0.2 11/22/2014 1018   NITRITE neg 11/22/2014 1018   LEUKOCYTESUR Negative 11/22/2014 1018    Pertinent Imaging:   Assessment & Plan:    1. BPH with LUTS:   We discussed starting the medication Flomax for his urinary symptoms. We also discussed  the side effects of that medication. He would like to postpone starting this medication at this time. He does not believe he does not believe his urinary symptoms are bad enough to risk the possible ejaculatory disorders associated with the Flomax. He will continue to address his urinary symptoms with behavioral and dietary changes.    - Urinalysis, Complete - BLADDER SCAN AMB NON-IMAGING  2. Right testicular pain:   Patient was found to have a left varicocele on exam. His pain is located in the right testicle. We will schedule scrotal ultrasound for further evaluation.  There may be a small right varicocele that could not be appreciated on the exam.  He will RTC for results.     No Follow-up on file.  Zara Council, [facility] [address] [place], [place] [address] [phone]

## 2014-12-26 ENCOUNTER — Ambulatory Visit
Admission: RE | Admit: 2014-12-26 | Discharge: 2014-12-26 | Disposition: A | Discharge status: Home / Self Care | Payer: BLUE CROSS/BLUE SHIELD | Source: Ambulatory Visit | Attending: Urology | Admitting: Urology

## 2014-12-26 DIAGNOSIS — N503 Cyst of epididymis: Secondary | ICD-10-CM | POA: Diagnosis not present

## 2014-12-26 DIAGNOSIS — N50811 Right testicular pain: Secondary | ICD-10-CM

## 2014-12-26 DIAGNOSIS — N508 Other specified disorders of male genital organs: Secondary | ICD-10-CM | POA: Diagnosis present

## 2014-12-26 DIAGNOSIS — N433 Hydrocele, unspecified: Secondary | ICD-10-CM | POA: Diagnosis not present

## 2015-01-04 ENCOUNTER — Encounter: Payer: Self-pay | Admitting: Urology

## 2015-01-04 ENCOUNTER — Ambulatory Visit (INDEPENDENT_AMBULATORY_CARE_PROVIDER_SITE_OTHER): Payer: BLUE CROSS/BLUE SHIELD | Admitting: Urology

## 2015-01-04 VITALS — BP 128/72 | HR 50 | Ht 76.0 in | Wt 197.3 lb

## 2015-01-04 DIAGNOSIS — R350 Frequency of micturition: Secondary | ICD-10-CM | POA: Diagnosis not present

## 2015-01-04 DIAGNOSIS — N410 Acute prostatitis: Secondary | ICD-10-CM | POA: Diagnosis not present

## 2015-01-04 LAB — BLADDER SCAN AMB NON-IMAGING: Scan Result: 57

## 2015-01-04 MED ORDER — DOXYCYCLINE HYCLATE 100 MG PO TABS: 100.0000 mg | ORAL_TABLET | Freq: Two times a day (BID) | ORAL | Status: DC

## 2015-01-04 MED ORDER — IBUPROFEN 800 MG PO TABS: 800.0000 mg | ORAL_TABLET | Freq: Three times a day (TID) | ORAL | Status: DC | PRN

## 2015-01-04 NOTE — Progress Notes (Signed)
01/04/2015 1:02 PM   Paul Pruitt 04/19/1966 [id number]  Referring provider: Jerrol Banana., MD [address] [place], [place] [address]  Chief Complaint  Patient presents with  . Follow-up    [place] results, sxs worse testicle pain    HPI: Paul Pruitt is a 49 year old white male who presented 2 weeks ago at the request of his primary care physician, Dr. Rosanna Randy, for urinary hesitancy and right testicular pain. A scrotal ultrasound was ordered and he presents today to discuss those results.  He states that the testicular pain has become, increasingly worse since I examined him 2 weeks ago. The pain is now in both testicles. He has not noticed any scrotal swelling. He has not had any scrotal trauma. Not had any dysuria or penile discharge. He still having urinary frequency and hesitancy.  Prostate was found to be enlarged on exam 2 weeks ago, but it was not boggy or tender at the time of that exam.  He has not had any flank pain or hematuria. He was found to be emptying his bladder at his last exam, but we will check that again today.  His scrotal ultrasound noted no abnormal testicular masses are observed,  no findings to suggest testicular torsion.  Multiple epididymal cysts greater on the right than on the left.  There is no evidence of acute epididymitis.  Small bilateral hydroceles.  After his scrotal exam today, the right testicular pain abated and the left testicle was achy.    PMH: Past Medical History  Diagnosis Date  . Melanoma     Surgical History: Past Surgical History  Procedure Laterality Date  . Hernia repair    . Laminectomy      x 2  . Tonsillectomy    . Melanoma excision      chest area    Home Medications:    Medication List       This list is accurate as of: 01/04/15  1:02 PM.  Always use your most recent med list.               doxycycline 100 MG tablet  Commonly known as:  VIBRA-TABS  Take 1 tablet (100 mg total) by mouth 2  (two) times daily.     ibuprofen 800 MG tablet  Commonly known as:  ADVIL,MOTRIN  Take 1 tablet (800 mg total) by mouth every 8 (eight) hours as needed for moderate pain.     levothyroxine 25 MCG tablet  Commonly known as:  SYNTHROID, LEVOTHROID     MULTIVITAMIN & MINERAL Liqd  Take by mouth.     OMEGA-3 FATTY ACIDS PO  Take 2 capsules by mouth daily.        Allergies: No Known Allergies  Family History: Family History  Problem Relation Age of Onset  . Arthritis Mother   . Colon cancer Mother   . Hypertension Father   . Asthma Father   . Hypertension Brother   . Obesity Brother   . Asthma Son   . Obesity Son   . Kidney disease Neg Hx   . Prostate cancer Neg Hx     Social History:  reports that he has never smoked. He does not have any smokeless tobacco history on file. He reports that he drinks alcohol. He reports that he does not use illicit drugs.  ROS: UROLOGY Frequent Urination?: Yes Hard to postpone urination?: No Burning/pain with urination?: No Get up at night to urinate?: Yes  Leakage of urine?: No Urine stream starts and stops?: Yes Trouble starting stream?: No Do you have to strain to urinate?: No Blood in urine?: No Urinary tract infection?: No Sexually transmitted disease?: No Injury to kidneys or bladder?: No Painful intercourse?: No Weak stream?: No Erection problems?: No Penile pain?: No  Gastrointestinal Nausea?: No Vomiting?: No Indigestion/heartburn?: No Diarrhea?: No Constipation?: No  Constitutional Fever: No Night sweats?: No Weight loss?: No Fatigue?: No  Skin Skin rash/lesions?: No Itching?: No  Eyes Blurred vision?: No Double vision?: No  Ears/Nose/Throat Sore throat?: No Sinus problems?: No  Hematologic/Lymphatic Swollen glands?: No Easy bruising?: No  Cardiovascular Leg swelling?: No Chest pain?: No  Respiratory Cough?: No Shortness of breath?: No  Endocrine Excessive thirst?:  No  Musculoskeletal Back pain?: Yes Joint pain?: No  Neurological Headaches?: No Dizziness?: No  Psychologic Depression?: No Anxiety?: No  Physical Exam: BP 128/72 mmHg  Pulse 50  Ht 6\' 4"  (1.93 m)  Wt 197 lb 4.8 oz (89.495 kg)  BMI 24.03 kg/m2  GU: Patient with circumcised phallus. Urethral meatus is patent. No penile discharge. No penile lesions or rashes. Scrotum without lesions, cysts, rashes and/or edema. Testicles are located scrotally bilaterally. Patient reports pain during the exam.  No masses are appreciated in the testicles. Left epididymis is normal. Right epididymal cysts are palpated.    Laboratory Data: Results for orders placed or performed in visit on 01/04/15  BLADDER SCAN AMB NON-IMAGING  Result Value Ref Range   Scan Result 57    No results found for: WBC, HGB, HCT, MCV, PLT  No results found for: CREATININE  No results found for: PSA  No results found for: TESTOSTERONE  No results found for: HGBA1C  Urinalysis    Component Value Date/Time   GLUCOSEU Negative 12/21/2014 1056   BILIRUBINUR Negative 12/21/2014 1056   BILIRUBINUR neg 11/22/2014 1018   PROTEINUR neg 11/22/2014 1018   UROBILINOGEN 0.2 11/22/2014 1018   NITRITE Negative 12/21/2014 1056   NITRITE neg 11/22/2014 1018   LEUKOCYTESUR Negative 12/21/2014 1056   LEUKOCYTESUR Negative 11/22/2014 1018    Pertinent Imaging: CLINICAL DATA: Right testicular pain for the past 3 months  EXAM: SCROTAL ULTRASOUND  DOPPLER ULTRASOUND OF THE TESTICLES  TECHNIQUE: Complete ultrasound examination of the testicles, epididymis, and other scrotal structures was performed. Color and spectral Doppler ultrasound were also utilized to evaluate blood flow to the testicles.  COMPARISON: None.  FINDINGS: Right testicle  Measurements: 5.5 x 3 x 3.6 cm. There is a 3 x 2 x 3 mm hyperechoic focus in the right testicle likely reflecting fat or non shadowing calcification. It exhibits  no increased vascularity.  Left testicle  Measurements: 5 x 2.5 x 3.2 cm. There is a punctate hyperechoic focus compatible with calcification.  Right epididymis: The right epididymis contains multiple cystic lesions with the largest measuring 2.6 x 1.4 x 2 cm. The epididymal vascularity is not abnormally increased.  Left epididymis: The left epididymis contains cystic lesions with the largest measuring 5 mm in diameter. Vascularity of the left epididymis is normal.  Hydrocele: There are small bilateral hydroceles.  Varicocele: None visualized.  Pulsed Doppler interrogation of both testes demonstrates normal low resistance arterial and venous waveforms bilaterally.  IMPRESSION: 1. No abnormal testicular masses are observed. There are no findings to suggest testicular torsion. 2. Multiple epididymal cysts greater on the right than on the left. There is no evidence of acute epididymitis. 3. Small bilateral hydroceles.   Electronically Signed  By: Shanon Brow  Martinique M.D.  On: 12/26/2014 16:30  Assessment & Plan:    1. Bilateral testicular pain:   No etiology was found on ultrasound for the bilateral testicular pain.   2. Prostatitis:   The testicular pain may be a result from a prostatitis. Patient did report a decrease in his urinary symptoms when he was on doxycycline a few months ago. We'll place the patient on a trial of doxycycline and ibuprofen and have him return in 2 weeks' time. If his pain has not improved, we will refer him for physical therapy.    There are no diagnoses linked to this encounter.  Return in about 2 weeks (around 01/18/2015) for exam.  Zara Council, [facility]  [facility] [address] [place], [place] [address] [phone]

## 2015-01-09 ENCOUNTER — Other Ambulatory Visit: Payer: Self-pay | Admitting: Family Medicine

## 2015-01-09 DIAGNOSIS — E039 Hypothyroidism, unspecified: Secondary | ICD-10-CM

## 2015-01-19 ENCOUNTER — Ambulatory Visit: Payer: BLUE CROSS/BLUE SHIELD | Admitting: Urology

## 2015-01-23 ENCOUNTER — Ambulatory Visit (INDEPENDENT_AMBULATORY_CARE_PROVIDER_SITE_OTHER): Payer: BLUE CROSS/BLUE SHIELD | Admitting: Urology

## 2015-01-23 ENCOUNTER — Encounter: Payer: Self-pay | Admitting: Urology

## 2015-01-23 VITALS — BP 136/73 | HR 64 | Ht 76.0 in | Wt 196.7 lb

## 2015-01-23 DIAGNOSIS — N138 Other obstructive and reflux uropathy: Secondary | ICD-10-CM

## 2015-01-23 DIAGNOSIS — N401 Enlarged prostate with lower urinary tract symptoms: Secondary | ICD-10-CM

## 2015-01-23 DIAGNOSIS — Z1159 Encounter for screening for other viral diseases: Secondary | ICD-10-CM | POA: Insufficient documentation

## 2015-01-23 DIAGNOSIS — R3911 Hesitancy of micturition: Secondary | ICD-10-CM | POA: Diagnosis not present

## 2015-01-23 LAB — MICROSCOPIC EXAMINATION
Bacteria, UA: NONE SEEN
Epithelial Cells (non renal): NONE SEEN /hpf (ref 0–10)

## 2015-01-23 LAB — URINALYSIS, COMPLETE
Bilirubin, UA: NEGATIVE
Glucose, UA: NEGATIVE
Ketones, UA: NEGATIVE
Leukocytes, UA: NEGATIVE
Nitrite, UA: NEGATIVE
Protein, UA: NEGATIVE
RBC, UA: NEGATIVE
Specific Gravity, UA: 1.025 (ref 1.005–1.030)
Urobilinogen, Ur: 0.2 mg/dL (ref 0.2–1.0)
pH, UA: 6 (ref 5.0–7.5)

## 2015-01-23 NOTE — Progress Notes (Signed)
11:32 AM   Paul Pruitt [birth year] [id number]  Referring provider: Jerrol Banana., MD [address] [place], [place] [address]  Chief Complaint  Patient presents with  . Benign Prostatic Hypertrophy    HPI: 49 yo M with urinary hesitancy and primarily right testicular pain (occasionally bilateral).  He has seen the PA in our clinic, Zara Council, and has been started on doxycycline for presumed prostatitis although his rectal exam was benign.  He has noted since starting the medication, his pain has improved and efficiently and nearly resolved until today. He just completed his course yesterday.   He notes today that he is very active and runs most days. He first started noticing the pain back in April. Since then he started to tear scrotal support and compression shorts when running. He is also been treated by "healing touch" which has also seemed to help.   He has no history of testicular pain other than some issues following his vasectomy many years ago.  Scrotal ultrasound shows no obvious scrotal pathology other than small epididymal cysts and small bilateral hydroceles.   He has not noticed any scrotal swelling. He has not had any scrotal trauma. No dysuria or penile discharge. He still having urinary frequency and hesitancy.  PVRs miminal.  PSA 0.6 in 10/2014.    PMH: Past Medical History  Diagnosis Date  . Melanoma     Surgical History: Past Surgical History  Procedure Laterality Date  . Hernia repair    . Laminectomy      x 2  . Tonsillectomy    . Melanoma excision      chest area    Home Medications:    Medication List       This list is accurate as of: 01/23/15 11:59 PM.  Always use your most recent med list.               doxycycline 100 MG tablet  Commonly known as:  VIBRA-TABS  Take 1 tablet (100 mg total) by mouth 2 (two) times daily.     ibuprofen 800 MG tablet  Commonly known as:  ADVIL,MOTRIN  Take 1 tablet (800 mg  total) by mouth every 8 (eight) hours as needed for moderate pain.     levothyroxine 25 MCG tablet  Commonly known as:  SYNTHROID, LEVOTHROID  TAKE 1 TABLET BY MOUTH EVERY DAY     MULTIVITAMIN & MINERAL Liqd  Take by mouth.     OMEGA-3 FATTY ACIDS PO  Take 2 capsules by mouth daily.        Allergies: No Known Allergies  Family History: Family History  Problem Relation Age of Onset  . Arthritis Mother   . Colon cancer Mother   . Hypertension Father   . Asthma Father   . Hypertension Brother   . Obesity Brother   . Asthma Son   . Obesity Son   . Kidney disease Neg Hx   . Prostate cancer Neg Hx     Social History:  reports that he has never smoked. He does not have any smokeless tobacco history on file. He reports that he drinks alcohol. He reports that he does not use illicit drugs.  ROS: UROLOGY Frequent Urination?: Yes Hard to postpone urination?: No Burning/pain with urination?: No Get up at night to urinate?: Yes Leakage of urine?: No Urine stream starts and stops?: Yes Trouble starting stream?: No Do you have to strain to urinate?: No Blood in  urine?: No Urinary tract infection?: No Sexually transmitted disease?: No Injury to kidneys or bladder?: No Painful intercourse?: No Weak stream?: Yes Erection problems?: No Penile pain?: No  Gastrointestinal Nausea?: No Vomiting?: No Indigestion/heartburn?: No Diarrhea?: No Constipation?: No  Constitutional Fever: No Night sweats?: Yes Weight loss?: No Fatigue?: Yes  Skin Skin rash/lesions?: No Itching?: No  Eyes Blurred vision?: No Double vision?: No  Ears/Nose/Throat Sore throat?: No Sinus problems?: No  Hematologic/Lymphatic Swollen glands?: No Easy bruising?: No  Cardiovascular Leg swelling?: No Chest pain?: No  Respiratory Cough?: No Shortness of breath?: No  Endocrine Excessive thirst?: No  Musculoskeletal Back pain?: Yes Joint pain?: No  Neurological Headaches?:  No Dizziness?: No  Psychologic Depression?: No Anxiety?: No  Physical Exam: BP 136/73 mmHg  Pulse 64  Ht 6\' 4"  (1.93 m)  Wt 196 lb 11.2 oz (89.223 kg)  BMI 23.95 kg/m2  General no acute distress. Alert and oriented Presents today with his wife. Logically grossly intact. Normal gait. Moving all 4 extremities. No increased work of breathing or respiratory distress. Abdomen soft, benign. No lower extremity edema. Urinalysis Results for orders placed or performed in visit on 01/23/15  Microscopic Examination  Result Value Ref Range   WBC, UA 0-5 0 -  5 /hpf   RBC, UA 0-2 0 -  2 /hpf   Epithelial Cells (non renal) None seen 0 - 10 /hpf   Mucus, UA Present (A) Not Estab.   Bacteria, UA None seen None seen/Few  Urinalysis, Complete  Result Value Ref Range   Specific Gravity, UA 1.025 1.005 - 1.030   pH, UA 6.0 5.0 - 7.5   Color, UA Yellow Yellow   Appearance Ur Clear Clear   Leukocytes, UA Negative Negative   Protein, UA Negative Negative/Trace   Glucose, UA Negative Negative   Ketones, UA Negative Negative   RBC, UA Negative Negative   Bilirubin, UA Negative Negative   Urobilinogen, Ur 0.2 0.2 - 1.0 mg/dL   Nitrite, UA Negative Negative   Microscopic Examination See below:     Pertinent Imaging: CLINICAL DATA: Right testicular pain for the past 3 months  EXAM: SCROTAL ULTRASOUND  DOPPLER ULTRASOUND OF THE TESTICLES  TECHNIQUE: Complete ultrasound examination of the testicles, epididymis, and other scrotal structures was performed. Color and spectral Doppler ultrasound were also utilized to evaluate blood flow to the testicles.  COMPARISON: None.  FINDINGS: Right testicle  Measurements: 5.5 x 3 x 3.6 cm. There is a 3 x 2 x 3 mm hyperechoic focus in the right testicle likely reflecting fat or non shadowing calcification. It exhibits no increased vascularity.  Left testicle  Measurements: 5 x 2.5 x 3.2 cm. There is a punctate hyperechoic focus  compatible with calcification.  Right epididymis: The right epididymis contains multiple cystic lesions with the largest measuring 2.6 x 1.4 x 2 cm. The epididymal vascularity is not abnormally increased.  Left epididymis: The left epididymis contains cystic lesions with the largest measuring 5 mm in diameter. Vascularity of the left epididymis is normal.  Hydrocele: There are small bilateral hydroceles.  Varicocele: None visualized.  Pulsed Doppler interrogation of both testes demonstrates normal low resistance arterial and venous waveforms bilaterally.  IMPRESSION: 1. No abnormal testicular masses are observed. There are no findings to suggest testicular torsion. 2. Multiple epididymal cysts greater on the right than on the left. There is no evidence of acute epididymitis. 3. Small bilateral hydroceles.   Electronically Signed  By: David Martinique M.D.  On: 12/26/2014 16:30  Assessment & Plan:  49 year old male with several months of chronic testicular pain right greater than left.  He is seen improvement with doxycycline is not tried any course of NSAIDs. Scrotal ultrasound shows no obvious pathology other then epididymal cysts, larger greater on the right. I do not suspect that these are the etiology of his pain. He also has a history of urinary hesitancy without incomplete bladder emptying which is somewhat stitches were some underlying pelvic floor dysfunction.  1. chronic testicular pain We discussed today several supportive techniques including scrotal support, course of NSAIDs during flares of pain, and possibly even the role of physical therapy to help with his chronic pain. No underlying anatomic pathology clearly identified contributing to his pain.  He is quite anxious today and does have some urinary hesitancy issues in addition to has scrotal pain.  I suspect that he may have some underlying pelvic floor dysfunction and would greatly benefit from physical  therapy which we discussed at length. He would like to defer this at this time but will continue to do his "healing touch" and other relaxation techniques with his wife which seemed to help.  2. Urinary hesitancy Patient offered Flomax today, side effects discussed. He is not interested in taking any additional medications. - Urinalysis, Complete - Microscopic Examination   Return if symptoms worsen or fail to improve.  Hollice Espy, MD  [facility] [address] [place], [place] [address] [phone]

## 2015-10-30 ENCOUNTER — Encounter: Payer: Self-pay | Admitting: Family Medicine

## 2015-10-30 ENCOUNTER — Ambulatory Visit (INDEPENDENT_AMBULATORY_CARE_PROVIDER_SITE_OTHER): Payer: BLUE CROSS/BLUE SHIELD | Admitting: Family Medicine

## 2015-10-30 VITALS — BP 110/68 | HR 62 | Temp 97.6°F | Resp 14 | Ht 76.0 in | Wt 195.0 lb

## 2015-10-30 DIAGNOSIS — Z1211 Encounter for screening for malignant neoplasm of colon: Secondary | ICD-10-CM

## 2015-10-30 DIAGNOSIS — Z125 Encounter for screening for malignant neoplasm of prostate: Secondary | ICD-10-CM | POA: Diagnosis not present

## 2015-10-30 DIAGNOSIS — Z0000 Encounter for general adult medical examination without abnormal findings: Secondary | ICD-10-CM | POA: Diagnosis not present

## 2015-10-30 NOTE — Progress Notes (Signed)
Patient ID: Paul Pruitt, male   DOB: Oct 03, 1965, 50 y.o.   MRN: [id number]       Patient: Paul Pruitt, Male    DOB: [birth year], 50 y.o.   MRN: [id number] Visit Date: 10/30/2015  Today's Provider: Wilhemena Durie, MD   Chief Complaint  Patient presents with  . Annual Exam   Subjective:    Annual physical exam Paul Pruitt is a 50 y.o. male who presents today for health maintenance and complete physical. He feels fairly well. He reports exercising daily, running. He reports he is sleeping fairly well. -----------------------------------------------------------------   Colonoscopy- 04/27/13 10 polyps, Tubular adenoma and hyperplastic polyps. Repeat 3 years  Immunization History  Administered Date(s) Administered  . Tdap 07/21/2007     Review of Systems  Constitutional: Negative.   HENT: Negative.   Eyes: Negative.   Respiratory: Negative.   Cardiovascular: Negative.   Gastrointestinal: Negative.   Endocrine: Negative.   Genitourinary: Negative.   Musculoskeletal: Positive for back pain.  Skin: Negative.   Allergic/Immunologic: Negative.   Neurological: Negative.   Hematological: Negative.   Psychiatric/Behavioral: Positive for sleep disturbance. The patient is nervous/anxious.     Social History      He  reports that he has never smoked. He does not have any smokeless tobacco history on file. He reports that he drinks alcohol. He reports that he does not use illicit drugs.       Social History   Social History  . Marital Status: Married    Spouse Name: N/A  . Number of Children: N/A  . Years of Education: N/A   Social History Main Topics  . Smoking status: Never Smoker   . Smokeless tobacco: None  . Alcohol Use: 0.0 oz/week    0 Standard drinks or equivalent per week     Comment: seldomly  . Drug Use: No  . Sexual Activity: Not Asked   Other Topics Concern  . None   Social History Narrative   Patient states heavy exercise. 7 days per week x  30-90 minutes.    Past Medical History  Diagnosis Date  . Melanoma [facility])      Patient Active Problem List   Diagnosis Date Noted  . Screening examination for poliomyelitis 01/23/2015  . BPH with obstruction/lower urinary tract symptoms 12/21/2014  . Testicular pain, right 12/21/2014  . Anxiety 11/21/2014  . Chest pain 11/21/2014  . Hypothyroidism 11/21/2014  . Vitamin D deficiency 11/21/2014  . Hx of colonic polyp 11/21/2014  . Nocturia 11/21/2014  . Encounter for general adult medical examination without abnormal findings 10/17/2013  . Encounter for screening for malignant neoplasm of colon 10/17/2013  . Encounter for screening for malignant neoplasm of prostate 10/17/2013  . Acid reflux 02/15/2002    Past Surgical History  Procedure Laterality Date  . Hernia repair    . Laminectomy      x 2  . Tonsillectomy    . Melanoma excision      chest area    Family History        Family Status  Relation Status Death Age  . Mother Deceased   . Father Deceased   . Brother Alive   . Son Alive   . Son Alive         His family history includes Arthritis in his mother; Asthma in his father and son; Colon cancer in his mother; Hypertension in his brother and father; Obesity in his brother and son. There is  no history of Kidney disease or Prostate cancer.    No Known Allergies  Previous Medications   IBUPROFEN (ADVIL,MOTRIN) 800 MG TABLET    Take 1 tablet (800 mg total) by mouth every 8 (eight) hours as needed for moderate pain.   LEVOTHYROXINE (SYNTHROID, LEVOTHROID) 25 MCG TABLET    TAKE 1 TABLET BY MOUTH EVERY DAY   MULTIPLE VITAMINS-MINERALS (MULTIVITAMIN & MINERAL) LIQD    Take by mouth.   OMEGA-3 FATTY ACIDS PO    Take 2 capsules by mouth daily.    Patient Care Team: Jerrol Banana., MD as PCP - General (Family Medicine)     Objective:   Vitals: BP 110/68 mmHg  Pulse 62  Temp(Src) 97.6 F (36.4 C) (Oral)  Resp 14  Ht 6\' 4"  (1.93 m)  Wt 195 lb (88.451  kg)  BMI 23.75 kg/m2   Physical Exam  Constitutional: He is oriented to person, place, and time. He appears well-developed and well-nourished.  HENT:  Head: Normocephalic and atraumatic.  Right Ear: External ear normal.  Left Ear: External ear normal.  Nose: Nose normal.  Mouth/Throat: Oropharynx is clear and moist.  Eyes: Conjunctivae and EOM are normal. Pupils are equal, round, and reactive to light.  Neck: Normal range of motion. Neck supple.  Cardiovascular: Normal rate, regular rhythm, normal heart sounds and intact distal pulses.   Pulmonary/Chest: Effort normal and breath sounds normal.  Abdominal: Soft.  Musculoskeletal: Normal range of motion.  Neurological: He is alert and oriented to person, place, and time. He has normal reflexes.  Skin: Skin is warm and dry.  Psychiatric: He has a normal mood and affect. His behavior is normal. Judgment and thought content normal.     Depression Screen PHQ 2/9 Scores 10/30/2015  PHQ - 2 Score 0      Assessment & Plan:     Routine Health Maintenance and Physical Exam  Exercise Activities and Dietary recommendations Goals    None      Immunization History  Administered Date(s) Administered  . Tdap 07/21/2007    Health Maintenance  Topic Date Due  . HIV Screening  03/02/1982  . INFLUENZA VACCINE  01/15/2016  . TETANUS/TDAP  07/20/2017      Discussed health benefits of physical activity, and encouraged him to engage in regular exercise appropriate for his age and condition.                 Chronic low back pain/status post LS back surgeries               History of dysplastic nevus--followed by Dr. Nehemiah Massed               Hypothyroidism I have done the exam and reviewed the above chart and it is accurate to the best of my knowledge.  --------------------------------------------------------------------

## 2015-10-31 ENCOUNTER — Telehealth: Payer: Self-pay

## 2015-10-31 LAB — CBC WITH DIFFERENTIAL/PLATELET
Basophils Absolute: 0 10*3/uL (ref 0.0–0.2)
Basos: 1 %
EOS (ABSOLUTE): 0 10*3/uL (ref 0.0–0.4)
Eos: 1 %
Hematocrit: 41.7 % (ref 37.5–51.0)
Hemoglobin: 14.2 g/dL (ref 12.6–17.7)
Immature Grans (Abs): 0 10*3/uL (ref 0.0–0.1)
Immature Granulocytes: 0 %
Lymphocytes Absolute: 1.4 10*3/uL (ref 0.7–3.1)
Lymphs: 32 %
MCH: 30.5 pg (ref 26.6–33.0)
MCHC: 34.1 g/dL (ref 31.5–35.7)
MCV: 90 fL (ref 79–97)
Monocytes Absolute: 0.3 10*3/uL (ref 0.1–0.9)
Monocytes: 8 %
Neutrophils Absolute: 2.6 10*3/uL (ref 1.4–7.0)
Neutrophils: 58 %
Platelets: 170 10*3/uL (ref 150–379)
RBC: 4.66 x10E6/uL (ref 4.14–5.80)
RDW: 12.6 % (ref 12.3–15.4)
WBC: 4.3 10*3/uL (ref 3.4–10.8)

## 2015-10-31 LAB — LIPID PANEL WITH LDL/HDL RATIO
Cholesterol, Total: 145 mg/dL (ref 100–199)
HDL: 69 mg/dL (ref >39)
LDL Calculated: 69 mg/dL (ref 0–99)
LDl/HDL Ratio: 1 ratio units (ref 0.0–3.6)
Triglycerides: 33 mg/dL (ref 0–149)
VLDL Cholesterol Cal: 7 mg/dL (ref 5–40)

## 2015-10-31 LAB — COMPREHENSIVE METABOLIC PANEL
ALT: 18 IU/L (ref 0–44)
AST: 22 IU/L (ref 0–40)
Albumin/Globulin Ratio: 2.3 — ABNORMAL HIGH (ref 1.2–2.2)
Albumin: 4.6 g/dL (ref 3.5–5.5)
Alkaline Phosphatase: 60 IU/L (ref 39–117)
BUN/Creatinine Ratio: 22 — ABNORMAL HIGH (ref 9–20)
BUN: 22 mg/dL (ref 6–24)
Bilirubin Total: 1 mg/dL (ref 0.0–1.2)
CO2: 25 mmol/L (ref 18–29)
Calcium: 9.4 mg/dL (ref 8.7–10.2)
Chloride: 102 mmol/L (ref 96–106)
Creatinine, Ser: 0.98 mg/dL (ref 0.76–1.27)
GFR calc Af Amer: 105 mL/min/{1.73_m2} (ref >59)
GFR calc non Af Amer: 91 mL/min/{1.73_m2} (ref >59)
Globulin, Total: 2 g/dL (ref 1.5–4.5)
Glucose: 89 mg/dL (ref 65–99)
Potassium: 4.2 mmol/L (ref 3.5–5.2)
Sodium: 141 mmol/L (ref 134–144)
Total Protein: 6.6 g/dL (ref 6.0–8.5)

## 2015-10-31 LAB — TSH: TSH: 3.68 u[IU]/mL (ref 0.450–4.500)

## 2015-10-31 LAB — PSA: Prostate Specific Ag, Serum: 0.5 ng/mL (ref 0.0–4.0)

## 2015-10-31 NOTE — Telephone Encounter (Signed)
LMTCB

## 2015-10-31 NOTE — Telephone Encounter (Signed)
Patient advised as directed below. 

## 2015-10-31 NOTE — Telephone Encounter (Signed)
-----   Message from Carmon Ginsberg, [place] sent at 10/31/2015 10:01 AM EDT ----- Labs normal

## 2015-11-23 DIAGNOSIS — Z800 Family history of malignant neoplasm of digestive organs: Secondary | ICD-10-CM | POA: Diagnosis not present

## 2015-11-23 DIAGNOSIS — Z86010 Personal history of colonic polyps: Secondary | ICD-10-CM | POA: Diagnosis not present

## 2015-11-23 DIAGNOSIS — Z8371 Family history of colonic polyps: Secondary | ICD-10-CM | POA: Diagnosis not present

## 2015-12-10 DIAGNOSIS — H25043 Posterior subcapsular polar age-related cataract, bilateral: Secondary | ICD-10-CM | POA: Diagnosis not present

## 2015-12-11 DIAGNOSIS — M545 Low back pain, unspecified: Secondary | ICD-10-CM | POA: Insufficient documentation

## 2015-12-26 DIAGNOSIS — M545 Low back pain: Secondary | ICD-10-CM | POA: Diagnosis not present

## 2016-01-01 DIAGNOSIS — M545 Low back pain: Secondary | ICD-10-CM | POA: Diagnosis not present

## 2016-01-03 DIAGNOSIS — M545 Low back pain: Secondary | ICD-10-CM | POA: Diagnosis not present

## 2016-01-08 DIAGNOSIS — M545 Low back pain: Secondary | ICD-10-CM | POA: Diagnosis not present

## 2016-02-05 DIAGNOSIS — M5137 Other intervertebral disc degeneration, lumbosacral region: Secondary | ICD-10-CM | POA: Insufficient documentation

## 2016-02-05 DIAGNOSIS — M545 Low back pain: Secondary | ICD-10-CM | POA: Diagnosis not present

## 2016-02-05 DIAGNOSIS — M5136 Other intervertebral disc degeneration, lumbar region: Secondary | ICD-10-CM | POA: Diagnosis not present

## 2016-02-05 DIAGNOSIS — M51379 Other intervertebral disc degeneration, lumbosacral region without mention of lumbar back pain or lower extremity pain: Secondary | ICD-10-CM | POA: Insufficient documentation

## 2016-02-06 DIAGNOSIS — H2511 Age-related nuclear cataract, right eye: Secondary | ICD-10-CM | POA: Diagnosis not present

## 2016-02-06 DIAGNOSIS — H25041 Posterior subcapsular polar age-related cataract, right eye: Secondary | ICD-10-CM | POA: Diagnosis not present

## 2016-02-06 DIAGNOSIS — H2512 Age-related nuclear cataract, left eye: Secondary | ICD-10-CM | POA: Diagnosis not present

## 2016-02-06 DIAGNOSIS — H25042 Posterior subcapsular polar age-related cataract, left eye: Secondary | ICD-10-CM | POA: Diagnosis not present

## 2016-02-07 DIAGNOSIS — K640 First degree hemorrhoids: Secondary | ICD-10-CM | POA: Diagnosis not present

## 2016-02-07 DIAGNOSIS — D125 Benign neoplasm of sigmoid colon: Secondary | ICD-10-CM | POA: Diagnosis not present

## 2016-02-07 DIAGNOSIS — Z800 Family history of malignant neoplasm of digestive organs: Secondary | ICD-10-CM | POA: Diagnosis not present

## 2016-02-07 DIAGNOSIS — Z86010 Personal history of colonic polyps: Secondary | ICD-10-CM | POA: Diagnosis not present

## 2016-02-07 DIAGNOSIS — D124 Benign neoplasm of descending colon: Secondary | ICD-10-CM | POA: Diagnosis not present

## 2016-02-07 HISTORY — PX: COLONOSCOPY: SHX174

## 2016-02-13 DIAGNOSIS — M545 Low back pain: Secondary | ICD-10-CM | POA: Diagnosis not present

## 2016-02-13 DIAGNOSIS — M5136 Other intervertebral disc degeneration, lumbar region: Secondary | ICD-10-CM | POA: Diagnosis not present

## 2016-02-21 ENCOUNTER — Encounter: Payer: Self-pay | Admitting: Family Medicine

## 2016-02-26 DIAGNOSIS — M545 Low back pain: Secondary | ICD-10-CM | POA: Diagnosis not present

## 2016-02-26 DIAGNOSIS — M5136 Other intervertebral disc degeneration, lumbar region: Secondary | ICD-10-CM | POA: Diagnosis not present

## 2016-02-26 DIAGNOSIS — M5137 Other intervertebral disc degeneration, lumbosacral region: Secondary | ICD-10-CM | POA: Diagnosis not present

## 2016-03-10 ENCOUNTER — Encounter: Payer: Self-pay | Admitting: Family Medicine

## 2016-03-24 ENCOUNTER — Encounter: Payer: Self-pay | Admitting: Anesthesiology

## 2016-03-24 ENCOUNTER — Ambulatory Visit: Payer: BLUE CROSS/BLUE SHIELD | Attending: Anesthesiology | Admitting: Anesthesiology

## 2016-03-24 VITALS — BP 125/80 | HR 70 | Temp 97.7°F | Resp 16 | Ht 76.0 in | Wt 195.0 lb

## 2016-03-24 DIAGNOSIS — M419 Scoliosis, unspecified: Secondary | ICD-10-CM | POA: Insufficient documentation

## 2016-03-24 DIAGNOSIS — M47896 Other spondylosis, lumbar region: Secondary | ICD-10-CM | POA: Diagnosis not present

## 2016-03-24 DIAGNOSIS — M5136 Other intervertebral disc degeneration, lumbar region: Secondary | ICD-10-CM | POA: Diagnosis not present

## 2016-03-24 DIAGNOSIS — M545 Low back pain: Secondary | ICD-10-CM

## 2016-03-24 DIAGNOSIS — M4696 Unspecified inflammatory spondylopathy, lumbar region: Secondary | ICD-10-CM | POA: Insufficient documentation

## 2016-03-24 DIAGNOSIS — G8929 Other chronic pain: Secondary | ICD-10-CM | POA: Diagnosis not present

## 2016-03-24 DIAGNOSIS — M5386 Other specified dorsopathies, lumbar region: Secondary | ICD-10-CM

## 2016-03-24 DIAGNOSIS — M47816 Spondylosis without myelopathy or radiculopathy, lumbar region: Secondary | ICD-10-CM

## 2016-03-24 NOTE — Patient Instructions (Signed)
GENERAL RISKS AND COMPLICATIONS  What are the risk, side effects and possible complications? Generally speaking, most procedures are safe.  However, with any procedure there are risks, side effects, and the possibility of complications.  The risks and complications are dependent upon the sites that are lesioned, or the type of nerve block to be performed.  The closer the procedure is to the spine, the more serious the risks are.  Great care is taken when placing the radio frequency needles, block needles or lesioning probes, but sometimes complications can occur. 1. Infection: Any time there is an injection through the skin, there is a risk of infection.  This is why sterile conditions are used for these blocks.  There are four possible types of infection. 1. Localized skin infection. 2. Central Nervous System Infection-This can be in the form of Meningitis, which can be deadly. 3. Epidural Infections-This can be in the form of an epidural abscess, which can cause pressure inside of the spine, causing compression of the spinal cord with subsequent paralysis. This would require an emergency surgery to decompress, and there are no guarantees that the patient would recover from the paralysis. 4. Discitis-This is an infection of the intervertebral discs.  It occurs in about 1% of discography procedures.  It is difficult to treat and it may lead to surgery.        2. Pain: the needles have to go through skin and soft tissues, will cause soreness.       3. Damage to internal structures:  The nerves to be lesioned may be near blood vessels or    other nerves which can be potentially damaged.       4. Bleeding: Bleeding is more common if the patient is taking blood thinners such as  aspirin, Coumadin, Ticiid, Plavix, etc., or if he/she have some genetic predisposition  such as hemophilia. Bleeding into the spinal canal can cause compression of the spinal  cord with subsequent paralysis.  This would require an  emergency surgery to  decompress and there are no guarantees that the patient would recover from the  paralysis.       5. Pneumothorax:  Puncturing of a lung is a possibility, every time a needle is introduced in  the area of the chest or upper back.  Pneumothorax refers to free air around the  collapsed lung(s), inside of the thoracic cavity (chest cavity).  Another two possible  complications related to a similar event would include: Hemothorax and Chylothorax.   These are variations of the Pneumothorax, where instead of air around the collapsed  lung(s), you may have blood or chyle, respectively.       6. Spinal headaches: They may occur with any procedures in the area of the spine.       7. Persistent CSF (Cerebro-Spinal Fluid) leakage: This is a rare problem, but may occur  with prolonged intrathecal or epidural catheters either due to the formation of a fistulous  track or a dural tear.       8. Nerve damage: By working so close to the spinal cord, there is always a possibility of  nerve damage, which could be as serious as a permanent spinal cord injury with  paralysis.       9. Death:  Although rare, severe deadly allergic reactions known as "Anaphylactic  reaction" can occur to any of the medications used.      10. Worsening of the symptoms:  We can always make thing worse.    What are the chances of something like this happening? Chances of any of this occuring are extremely low.  By statistics, you have more of a chance of getting killed in a motor vehicle accident: while driving to the hospital than any of the above occurring .  Nevertheless, you should be aware that they are possibilities.  In general, it is similar to taking a shower.  Everybody knows that you can slip, hit your head and get killed.  Does that mean that you should not shower again?  Nevertheless always keep in mind that statistics do not mean anything if you happen to be on the wrong side of them.  Even if a procedure has a 1  (one) in a 1,000,000 (million) chance of going wrong, it you happen to be that one..Also, keep in mind that by statistics, you have more of a chance of having something go wrong when taking medications.  Who should not have this procedure? If you are on a blood thinning medication (e.g. Coumadin, Plavix, see list of "Blood Thinners"), or if you have an active infection going on, you should not have the procedure.  If you are taking any blood thinners, please inform your physician.  How should I prepare for this procedure?  Do not eat or drink anything at least six hours prior to the procedure.  Bring a driver with you .  It cannot be a taxi.  Come accompanied by an adult that can drive you back, and that is strong enough to help you if your legs get weak or numb from the local anesthetic.  Take all of your medicines the morning of the procedure with just enough water to swallow them.  If you have diabetes, make sure that you are scheduled to have your procedure done first thing in the morning, whenever possible.  If you have diabetes, take only half of your insulin dose and notify our nurse that you have done so as soon as you arrive at the clinic.  If you are diabetic, but only take blood sugar pills (oral hypoglycemic), then do not take them on the morning of your procedure.  You may take them after you have had the procedure.  Do not take aspirin or any aspirin-containing medications, at least eleven (11) days prior to the procedure.  They may prolong bleeding.  Wear loose fitting clothing that may be easy to take off and that you would not mind if it got stained with Betadine or blood.  Do not wear any jewelry or perfume  Remove any nail coloring.  It will interfere with some of our monitoring equipment.  NOTE: Remember that this is not meant to be interpreted as a complete list of all possible complications.  Unforeseen problems may occur.  BLOOD THINNERS The following drugs  contain aspirin or other products, which can cause increased bleeding during surgery and should not be taken for 2 weeks prior to and 1 week after surgery.  If you should need take something for relief of minor pain, you may take acetaminophen which is found in Tylenol,m Datril, Anacin-3 and Panadol. It is not blood thinner. The products listed below are.  Do not take any of the products listed below in addition to any listed on your instruction sheet.  A.P.C or A.P.C with Codeine Codeine Phosphate Capsules #3 Ibuprofen Ridaura  ABC compound Congesprin Imuran rimadil  Advil Cope Indocin Robaxisal  Alka-Seltzer Effervescent Pain Reliever and Antacid Coricidin or Coricidin-D  Indomethacin Rufen    Alka-Seltzer plus Cold Medicine Cosprin Ketoprofen S-A-C Tablets  Anacin Analgesic Tablets or Capsules Coumadin Korlgesic Salflex  Anacin Extra Strength Analgesic tablets or capsules CP-2 Tablets Lanoril Salicylate  Anaprox Cuprimine Capsules Levenox Salocol  Anexsia-D Dalteparin Magan Salsalate  Anodynos Darvon compound Magnesium Salicylate Sine-off  Ansaid Dasin Capsules Magsal Sodium Salicylate  Anturane Depen Capsules Marnal Soma  APF Arthritis pain formula Dewitt's Pills Measurin Stanback  Argesic Dia-Gesic Meclofenamic Sulfinpyrazone  Arthritis Bayer Timed Release Aspirin Diclofenac Meclomen Sulindac  Arthritis pain formula Anacin Dicumarol Medipren Supac  Analgesic (Safety coated) Arthralgen Diffunasal Mefanamic Suprofen  Arthritis Strength Bufferin Dihydrocodeine Mepro Compound Suprol  Arthropan liquid Dopirydamole Methcarbomol with Aspirin Synalgos  ASA tablets/Enseals Disalcid Micrainin Tagament  Ascriptin Doan's Midol Talwin  Ascriptin A/D Dolene Mobidin Tanderil  Ascriptin Extra Strength Dolobid Moblgesic Ticlid  Ascriptin with Codeine Doloprin or Doloprin with Codeine Momentum Tolectin  Asperbuf Duoprin Mono-gesic Trendar  Aspergum Duradyne Motrin or Motrin IB Triminicin  Aspirin  plain, buffered or enteric coated Durasal Myochrisine Trigesic  Aspirin Suppositories Easprin Nalfon Trillsate  Aspirin with Codeine Ecotrin Regular or Extra Strength Naprosyn Uracel  Atromid-S Efficin Naproxen Ursinus  Auranofin Capsules Elmiron Neocylate Vanquish  Axotal Emagrin Norgesic Verin  Azathioprine Empirin or Empirin with Codeine Normiflo Vitamin E  Azolid Emprazil Nuprin Voltaren  Bayer Aspirin plain, buffered or children's or timed BC Tablets or powders Encaprin Orgaran Warfarin Sodium  Buff-a-Comp Enoxaparin Orudis Zorpin  Buff-a-Comp with Codeine Equegesic Os-Cal-Gesic   Buffaprin Excedrin plain, buffered or Extra Strength Oxalid   Bufferin Arthritis Strength Feldene Oxphenbutazone   Bufferin plain or Extra Strength Feldene Capsules Oxycodone with Aspirin   Bufferin with Codeine Fenoprofen Fenoprofen Pabalate or Pabalate-SF   Buffets II Flogesic Panagesic   Buffinol plain or Extra Strength Florinal or Florinal with Codeine Panwarfarin   Buf-Tabs Flurbiprofen Penicillamine   Butalbital Compound Four-way cold tablets Penicillin   Butazolidin Fragmin Pepto-Bismol   Carbenicillin Geminisyn Percodan   Carna Arthritis Reliever Geopen Persantine   Carprofen Gold's salt Persistin   Chloramphenicol Goody's Phenylbutazone   Chloromycetin Haltrain Piroxlcam   Clmetidine heparin Plaquenil   Cllnoril Hyco-pap Ponstel   Clofibrate Hydroxy chloroquine Propoxyphen         Before stopping any of these medications, be sure to consult the physician who ordered them.  Some, such as Coumadin (Warfarin) are ordered to prevent or treat serious conditions such as "deep thrombosis", "pumonary embolisms", and other heart problems.  The amount of time that you may need off of the medication may also vary with the medication and the reason for which you were taking it.  If you are taking any of these medications, please make sure you notify your pain physician before you undergo any  procedures.         Facet Blocks Patient Information  Description: The facets are joints in the spine between the vertebrae.  Like any joints in the body, facets can become irritated and painful.  Arthritis can also effect the facets.  By injecting steroids and local anesthetic in and around these joints, we can temporarily block the nerve supply to them.  Steroids act directly on irritated nerves and tissues to reduce selling and inflammation which often leads to decreased pain.  Facet blocks may be done anywhere along the spine from the neck to the low back depending upon the location of your pain.   After numbing the skin with local anesthetic (like Novocaine), a small needle is passed onto the facet joints under x-ray guidance.    You may experience a sensation of pressure while this is being done.  The entire block usually lasts about 15-25 minutes.   Conditions which may be treated by facet blocks:   Low back/buttock pain  Neck/shoulder pain  Certain types of headaches  Preparation for the injection:  1. Do not eat any solid food or dairy products within 8 hours of your appointment. 2. You may drink clear liquid up to 3 hours before appointment.  Clear liquids include water, black coffee, juice or soda.  No milk or cream please. 3. You may take your regular medication, including pain medications, with a sip of water before your appointment.  Diabetics should hold regular insulin (if taken separately) and take 1/2 normal NPH dose the morning of the procedure.  Carry some sugar containing items with you to your appointment. 4. A driver must accompany you and be prepared to drive you home after your procedure. 5. Bring all your current medications with you. 6. An IV may be inserted and sedation may be given at the discretion of the physician. 7. A blood pressure cuff, EKG and other monitors will often be applied during the procedure.  Some patients may need to have extra oxygen  administered for a short period. 8. You will be asked to provide medical information, including your allergies and medications, prior to the procedure.  We must know immediately if you are taking blood thinners (like Coumadin/Warfarin) or if you are allergic to IV iodine contrast (dye).  We must know if you could possible be pregnant.  Possible side-effects:   Bleeding from needle site  Infection (rare, may require surgery)  Nerve injury (rare)  Numbness & tingling (temporary)  Difficulty urinating (rare, temporary)  Spinal headache (a headache worse with upright posture)  Light-headedness (temporary)  Pain at injection site (serveral days)  Decreased blood pressure (rare, temporary)  Weakness in arm/leg (temporary)  Pressure sensation in back/neck (temporary)   Call if you experience:   Fever/chills associated with headache or increased back/neck pain  Headache worsened by an upright position  New onset, weakness or numbness of an extremity below the injection site  Hives or difficulty breathing (go to the emergency room)  Inflammation or drainage at the injection site(s)  Severe back/neck pain greater than usual  New symptoms which are concerning to you  Please note:  Although the local anesthetic injected can often make your back or neck feel good for several hours after the injection, the pain will likely return. It takes 3-7 days for steroids to work.  You may not notice any pain relief for at least one week.  If effective, we will often do a series of 2-3 injections spaced 3-6 weeks apart to maximally decrease your pain.  After the initial series, you may be a candidate for a more permanent nerve block of the facets.  If you have any questions, please call [phone] [facility] 

## 2016-03-24 NOTE — Progress Notes (Signed)
Patient here today as new patient referred by Dr Arnoldo Morale for injections to back.  Patient has scoliosis and has had 2 back surgeries.    Safety precautions to be maintained throughout the outpatient stay will include: orient to surroundings, keep bed in low position, maintain call bell within reach at all times, provide assistance with transfer out of bed and ambulation.

## 2016-03-25 NOTE — Progress Notes (Signed)
Subjective:  Patient ID: Paul Pruitt, male    DOB: [birth year]  Age: 50 y.o. MRN: [id number]  CC: Back Pain (lower)      PROCEDURE:None  HPI RAMELLO COWICK presents for a new patient evaluation. He is a pleasant 50 year old white male with long-standing history of low back pain. He has had previous surgery 2 with lumbar discectomy. He was recently seen by Dr. Earle Gell and referred to [place] for pain management. At present he is considered a non-surgical candidate however ultimately he may require a lower lumbar fusion. He is currently describing a centralized low back pain with occasional radiation into the posterior buttock and hip region. This is gradually been getting worse to the point that his maximum VAS score is a 9 and rarely achieves a best score of 3. His pain is worse in the morning and aggravated by bending sitting twisting or sneezing. Stretching seems to help and he does experience spasming of the lower legs with some intermittent weakness. The pain is described as sharp and annoying and intermittent in nature. He has had a recent CT myelogram. He has been unable to get any significant relief with conservative therapy and is referred accordingly. He denies problems of bowel or bladder dysfunction.  History Paul Pruitt has a past medical history of Melanoma ([place]).   He has a past surgical history that includes Hernia repair; Laminectomy; Tonsillectomy; Melanoma excision; Colonoscopy (02/07/2016); and Back surgery (N/A, 2004, 2005).   His family history includes Arthritis in his mother; Asthma in his father and son; Colon cancer in his mother; Hypertension in his brother and father; Obesity in his brother and son.He reports that he has never smoked. He has never used smokeless tobacco. He reports that he drinks alcohol. He reports that he does not use drugs.  No results found for this or any previous visit.  No results found for: TOXASSSELUR  Outpatient Medications Prior to Visit   Medication Sig Dispense Refill  . ibuprofen (ADVIL,MOTRIN) 800 MG tablet Take 1 tablet (800 mg total) by mouth every 8 (eight) hours as needed for moderate pain. 60 tablet 0  . Multiple Vitamins-Minerals (MULTIVITAMIN & MINERAL) LIQD Take by mouth.    . OMEGA-3 FATTY ACIDS PO Take 2 capsules by mouth daily.    Marland Kitchen levothyroxine (SYNTHROID, LEVOTHROID) 25 MCG tablet TAKE 1 TABLET BY MOUTH EVERY DAY (Patient not taking: Reported on 03/24/2016) 30 tablet 12   No facility-administered medications prior to visit.    Lab Results  Component Value Date   WBC 4.3 10/30/2015   HCT 41.7 10/30/2015   PLT 170 10/30/2015   GLUCOSE 89 10/30/2015   CHOL 145 10/30/2015   TRIG 33 10/30/2015   HDL 69 10/30/2015   LDLCALC 69 10/30/2015   ALT 18 10/30/2015   AST 22 10/30/2015   NA 141 10/30/2015   K 4.2 10/30/2015   CL 102 10/30/2015   CREATININE 0.98 10/30/2015   BUN 22 10/30/2015   CO2 25 10/30/2015   TSH 3.680 10/30/2015    --------------------------------------------------------------------------------------------------------------------- US Scrotum  Result Date: 12/26/2014 CLINICAL DATA:  Right testicular pain for the past 3 months EXAM: SCROTAL ULTRASOUND DOPPLER ULTRASOUND OF THE TESTICLES TECHNIQUE: Complete ultrasound examination of the testicles, epididymis, and other scrotal structures was performed. Color and spectral Doppler ultrasound were also utilized to evaluate blood flow to the testicles. COMPARISON:  None. FINDINGS: Right testicle Measurements: 5.5 x 3 x 3.6 cm. There is a 3 x 2 x 3 mm hyperechoic focus  in the right testicle likely reflecting fat or non shadowing calcification. It exhibits no increased vascularity. Left testicle Measurements: 5 x 2.5 x 3.2 cm. There is a punctate hyperechoic focus compatible with calcification. Right epididymis: The right epididymis contains multiple cystic lesions with the largest measuring 2.6 x 1.4 x 2 cm. The epididymal vascularity is not abnormally  increased. Left epididymis: The left epididymis contains cystic lesions with the largest measuring 5 mm in diameter. Vascularity of the left epididymis is normal. Hydrocele:  There are small bilateral hydroceles. Varicocele:  None visualized. Pulsed Doppler interrogation of both testes demonstrates normal low resistance arterial and venous waveforms bilaterally. IMPRESSION: 1. No abnormal testicular masses are observed. There are no findings to suggest testicular torsion. 2. Multiple epididymal cysts greater on the right than on the left. There is no evidence of acute epididymitis. 3. Small bilateral hydroceles. Electronically Signed   By: David  Martinique M.D.   On: 12/26/2014 16:30   [place] Art/ven Flow Abd Pelv Doppler  Result Date: 12/26/2014 CLINICAL DATA:  Right testicular pain for the past 3 months EXAM: SCROTAL ULTRASOUND DOPPLER ULTRASOUND OF THE TESTICLES TECHNIQUE: Complete ultrasound examination of the testicles, epididymis, and other scrotal structures was performed. Color and spectral Doppler ultrasound were also utilized to evaluate blood flow to the testicles. COMPARISON:  None. FINDINGS: Right testicle Measurements: 5.5 x 3 x 3.6 cm. There is a 3 x 2 x 3 mm hyperechoic focus in the right testicle likely reflecting fat or non shadowing calcification. It exhibits no increased vascularity. Left testicle Measurements: 5 x 2.5 x 3.2 cm. There is a punctate hyperechoic focus compatible with calcification. Right epididymis: The right epididymis contains multiple cystic lesions with the largest measuring 2.6 x 1.4 x 2 cm. The epididymal vascularity is not abnormally increased. Left epididymis: The left epididymis contains cystic lesions with the largest measuring 5 mm in diameter. Vascularity of the left epididymis is normal. Hydrocele:  There are small bilateral hydroceles. Varicocele:  None visualized. Pulsed Doppler interrogation of both testes demonstrates normal low resistance arterial and venous  waveforms bilaterally. IMPRESSION: 1. No abnormal testicular masses are observed. There are no findings to suggest testicular torsion. 2. Multiple epididymal cysts greater on the right than on the left. There is no evidence of acute epididymitis. 3. Small bilateral hydroceles. Electronically Signed   By: David  Martinique M.D.   On: 12/26/2014 16:30       ---------------------------------------------------------------------------------------------------------------------- Past Medical History:  Diagnosis Date  . Melanoma [facility])     Past Surgical History:  Procedure Laterality Date  . BACK SURGERY N/A 2004, 2005   L3, 4 and L4,5  . COLONOSCOPY  02/07/2016   By dr Carlisle Beers polyps, internal hemorrhoids, waiting path report  . HERNIA REPAIR    . LAMINECTOMY     x 2  . MELANOMA EXCISION     chest area  . TONSILLECTOMY      Family History  Problem Relation Age of Onset  . Arthritis Mother   . Colon cancer Mother   . Hypertension Father   . Asthma Father   . Hypertension Brother   . Obesity Brother   . Asthma Son   . Obesity Son   . Kidney disease Neg Hx   . Prostate cancer Neg Hx     Social History  Substance Use Topics  . Smoking status: Never Smoker  . Smokeless tobacco: Never Used  . Alcohol use 0.0 oz/week     Comment: seldomly    ----------------------------------------------------------------------------------------------------------------------  Social History   Social History  . Marital status: Married    Spouse name: N/A  . Number of children: N/A  . Years of education: N/A   Social History Main Topics  . Smoking status: Never Smoker  . Smokeless tobacco: Never Used  . Alcohol use 0.0 oz/week     Comment: seldomly  . Drug use: No  . Sexual activity: Not Asked   Other Topics Concern  . None   Social History Narrative   Patient states heavy exercise. 7 days per week x 30-90 minutes.    Scheduled Meds: Continuous Infusions: PRN Meds:.   BP  125/80 (BP Location: Left Arm, Patient Position: Sitting, Cuff Size: Normal)   Pulse 70   Temp 97.7 F (36.5 C) (Oral)   Resp 16   Ht 6\' 4"  (1.93 m)   Wt 195 lb (88.5 kg)   SpO2 97%   BMI 23.74 kg/m    BP Readings from Last 3 Encounters:  03/24/16 125/80  10/30/15 110/68  01/23/15 136/73     Wt Readings from Last 3 Encounters:  03/24/16 195 lb (88.5 kg)  10/30/15 195 lb (88.5 kg)  01/23/15 196 lb 11.2 oz (89.2 kg)     ----------------------------------------------------------------------------------------------------------------------  ROS Review of Systems  Cardiac: Negative Pulmonary: Negative Neurologic: Scoliosis and as above Psychologic: Negative GI: Negative  Objective:  BP 125/80 (BP Location: Left Arm, Patient Position: Sitting, Cuff Size: Normal)   Pulse 70   Temp 97.7 F (36.5 C) (Oral)   Resp 16   Ht 6\' 4"  (1.93 m)   Wt 195 lb (88.5 kg)   SpO2 97%   BMI 23.74 kg/m   Physical Exam Patient is alert oriented cooperative compliant. He goes from seated to standing with considerable discomfort. Upper extremity strength is at baseline rated at 5 over 5 with good muscle tone and bulk to the upper extremities. Inspection of the low back a well-healed midline scar with significant paraspinous muscle tenderness bilaterally. With extension at the low back and left lateral rotation and loading he does reproduce his primary pain complaint. This is less pronounced with right extension. With the patient in the supine position he has a negative straight leg raise bilaterally. His muscle tone and bulk is good no sensory deficiencies are noted.     Assessment & Plan:   Demarquez was seen today for back pain.  Diagnoses and all orders for this visit:  Facet arthritis, degenerative, lumbar spine -     LUMBAR FACET(MEDIAL BRANCH NERVE BLOCK) MBNB; Future -     ToxASSURE Select 13 (MW), Urine  DDD (degenerative disc disease), lumbar  Low back derangement syndrome -      LUMBAR FACET(MEDIAL BRANCH NERVE BLOCK) MBNB; Future     ----------------------------------------------------------------------------------------------------------------------  Problem List Items Addressed This Visit    None    Visit Diagnoses    Facet arthritis, degenerative, lumbar spine    -  Primary   Relevant Orders   LUMBAR FACET(MEDIAL BRANCH NERVE BLOCK) MBNB   ToxASSURE Select 71 (MW), Urine   DDD (degenerative disc disease), lumbar       Low back derangement syndrome       Relevant Orders   LUMBAR FACET(MEDIAL BRANCH NERVE BLOCK) MBNB      ----------------------------------------------------------------------------------------------------------------------  1. Facet arthritis, degenerative, lumbar spine Based on clinical findings we will plan on a diagnostic left side lumbar facet block at his next visit. We've gone over the risks and benefits of this procedure in detail  and all questions answered today. In the meantime I want him to continue with back stretching strengthening exercises as reviewed today. - LUMBAR FACET(MEDIAL BRANCH NERVE BLOCK) MBNB; Future - ToxASSURE Select 13 (MW), Urine  2. DDD (degenerative disc disease), lumbar He may be a candidate for caudal epidural steroid injection to assist with some of the intermittent radicular symptoms he's experiencing.  3. Low back derangement syndrome As above. Furthermore we have addressed the issues regarding exercise and core strengthening today. - LUMBAR FACET(MEDIAL BRANCH NERVE BLOCK) MBNB; Future    ----------------------------------------------------------------------------------------------------------------------  I am having Mr. Kaplowitz maintain his OMEGA-3 FATTY ACIDS PO, MULTIVITAMIN & MINERAL, ibuprofen, and levothyroxine.   No orders of the defined types were placed in this encounter.      Follow-up: Return in about 1 week (around 03/31/2016) for evaluation, procedure.    Molli Barrows,  MD  This dictation was performed utilizing Dragon voice recognition software.  Please excuse any unintentional or mistaken typographical errors as a result of its unedited utilization.

## 2016-03-26 ENCOUNTER — Other Ambulatory Visit: Payer: Self-pay

## 2016-04-03 LAB — TOXASSURE SELECT 13 (MW), URINE

## 2016-04-17 ENCOUNTER — Other Ambulatory Visit: Payer: Self-pay | Admitting: Anesthesiology

## 2016-04-17 ENCOUNTER — Ambulatory Visit: Payer: BLUE CROSS/BLUE SHIELD | Attending: Anesthesiology | Admitting: Anesthesiology

## 2016-04-17 ENCOUNTER — Encounter: Payer: Self-pay | Admitting: Anesthesiology

## 2016-04-17 ENCOUNTER — Ambulatory Visit
Admission: RE | Admit: 2016-04-17 | Discharge: 2016-04-17 | Disposition: A | Discharge status: Home / Self Care | Payer: BLUE CROSS/BLUE SHIELD | Source: Ambulatory Visit | Attending: Anesthesiology | Admitting: Anesthesiology

## 2016-04-17 VITALS — BP 121/73 | HR 63 | Temp 98.0°F | Resp 15 | Ht 76.0 in | Wt 193.0 lb

## 2016-04-17 DIAGNOSIS — M5386 Other specified dorsopathies, lumbar region: Secondary | ICD-10-CM

## 2016-04-17 DIAGNOSIS — M5136 Other intervertebral disc degeneration, lumbar region: Secondary | ICD-10-CM | POA: Diagnosis not present

## 2016-04-17 DIAGNOSIS — Z79899 Other long term (current) drug therapy: Secondary | ICD-10-CM | POA: Diagnosis not present

## 2016-04-17 DIAGNOSIS — M47896 Other spondylosis, lumbar region: Secondary | ICD-10-CM | POA: Diagnosis not present

## 2016-04-17 DIAGNOSIS — R52 Pain, unspecified: Secondary | ICD-10-CM | POA: Diagnosis not present

## 2016-04-17 DIAGNOSIS — M545 Low back pain: Secondary | ICD-10-CM

## 2016-04-17 DIAGNOSIS — M4696 Unspecified inflammatory spondylopathy, lumbar region: Secondary | ICD-10-CM | POA: Insufficient documentation

## 2016-04-17 DIAGNOSIS — M47816 Spondylosis without myelopathy or radiculopathy, lumbar region: Secondary | ICD-10-CM

## 2016-04-17 MED ORDER — LACTATED RINGERS IV SOLN: 1000.0000 mL | INTRAVENOUS | Status: DC

## 2016-04-17 MED ORDER — ROPIVACAINE HCL 2 MG/ML IJ SOLN
INTRAMUSCULAR | Status: AC
  Administered 2016-04-17: 16:00:00
  Filled 2016-04-17: qty 10

## 2016-04-17 MED ORDER — TRIAMCINOLONE ACETONIDE 40 MG/ML IJ SUSP
INTRAMUSCULAR | Status: AC
  Administered 2016-04-17: 16:00:00
  Filled 2016-04-17: qty 1

## 2016-04-17 MED ORDER — SODIUM CHLORIDE 0.9% FLUSH: 10.0000 mL | Freq: Once | INTRAVENOUS | Status: DC

## 2016-04-17 MED ORDER — TRIAMCINOLONE ACETONIDE 40 MG/ML IJ SUSP: 40.0000 mg | Freq: Once | INTRAMUSCULAR | Status: DC

## 2016-04-17 MED ORDER — MIDAZOLAM HCL 5 MG/5ML IJ SOLN
INTRAMUSCULAR | Status: AC
  Administered 2016-04-17: 2 mg via INTRAVENOUS
  Filled 2016-04-17: qty 5

## 2016-04-17 MED ORDER — ROPIVACAINE HCL 2 MG/ML IJ SOLN: 10.0000 mL | Freq: Once | INTRAMUSCULAR | Status: DC

## 2016-04-17 MED ORDER — MIDAZOLAM HCL 5 MG/5ML IJ SOLN: 5.0000 mg | Freq: Once | INTRAMUSCULAR | Status: DC

## 2016-04-17 MED ORDER — LIDOCAINE HCL (PF) 1 % IJ SOLN: 5.0000 mL | Freq: Once | INTRAMUSCULAR | Status: DC

## 2016-04-17 NOTE — Progress Notes (Signed)
Safety precautions to be maintained throughout the outpatient stay will include: orient to surroundings, keep bed in low position, maintain call bell within reach at all times, provide assistance with transfer out of bed and ambulation.  

## 2016-04-17 NOTE — Patient Instructions (Signed)
Pain Management Discharge Instructions  General Discharge Instructions :  If you need to reach your doctor call: Monday-Friday 8:00 am - 4:00 pm at [phone] or toll free [phone].  After clinic hours [phone] to have operator reach doctor.  Bring all of your medication bottles to all your appointments in the pain clinic.  To cancel or reschedule your appointment with Pain Management please remember to call 24 hours in advance to avoid a fee.  Refer to the educational materials which you have been given on: General Risks, I had my Procedure. Discharge Instructions, Post Sedation.  Post Procedure Instructions:  The drugs you were given will stay in your system until tomorrow, so for the next 24 hours you should not drive, make any legal decisions or drink any alcoholic beverages.  You may eat anything you prefer, but it is better to start with liquids then soups and crackers, and gradually work up to solid foods.  Please notify your doctor immediately if you have any unusual bleeding, trouble breathing or pain that is not related to your normal pain.  Depending on the type of procedure that was done, some parts of your body may feel week and/or numb.  This usually clears up by tonight or the next day.  Walk with the use of an assistive device or accompanied by an adult for the 24 hours.  You may use ice on the affected area for the first 24 hours.  Put ice in a Ziploc bag and cover with a towel and place against area 15 minutes on 15 minutes off.  You may switch to heat after 24 hours.Facet Blocks Patient Information  Description: The facets are joints in the spine between the vertebrae.  Like any joints in the body, facets can become irritated and painful.  Arthritis can also effect the facets.  By injecting steroids and local anesthetic in and around these joints, we can temporarily block the nerve supply to them.  Steroids act directly on irritated nerves and tissues to  reduce selling and inflammation which often leads to decreased pain.  Facet blocks may be done anywhere along the spine from the neck to the low back depending upon the location of your pain.   After numbing the skin with local anesthetic (like Novocaine), a small needle is passed onto the facet joints under x-ray guidance.  You may experience a sensation of pressure while this is being done.  The entire block usually lasts about 15-25 minutes.   Conditions which may be treated by facet blocks:   Low back/buttock pain  Neck/shoulder pain  Certain types of headaches  Preparation for the injection:  1. Do not eat any solid food or dairy products within 8 hours of your appointment. 2. You may drink clear liquid up to 3 hours before appointment.  Clear liquids include water, black coffee, juice or soda.  No milk or cream please. 3. You may take your regular medication, including pain medications, with a sip of water before your appointment.  Diabetics should hold regular insulin (if taken separately) and take 1/2 normal NPH dose the morning of the procedure.  Carry some sugar containing items with you to your appointment. 4. A driver must accompany you and be prepared to drive you home after your procedure. 5. Bring all your current medications with you. 6. An IV may be inserted and sedation may be given at the discretion of the physician. 7. A blood pressure cuff, EKG and other monitors will often be   applied during the procedure.  Some patients may need to have extra oxygen administered for a short period. 8. You will be asked to provide medical information, including your allergies and medications, prior to the procedure.  We must know immediately if you are taking blood thinners (like Coumadin/Warfarin) or if you are allergic to IV iodine contrast (dye).  We must know if you could possible be pregnant.  Possible side-effects:   Bleeding from needle site  Infection (rare, may require  surgery)  Nerve injury (rare)  Numbness & tingling (temporary)  Difficulty urinating (rare, temporary)  Spinal headache (a headache worse with upright posture)  Light-headedness (temporary)  Pain at injection site (serveral days)  Decreased blood pressure (rare, temporary)  Weakness in arm/leg (temporary)  Pressure sensation in back/neck (temporary)   Call if you experience:   Fever/chills associated with headache or increased back/neck pain  Headache worsened by an upright position  New onset, weakness or numbness of an extremity below the injection site  Hives or difficulty breathing (go to the emergency room)  Inflammation or drainage at the injection site(s)  Severe back/neck pain greater than usual  New symptoms which are concerning to you  Please note:  Although the local anesthetic injected can often make your back or neck feel good for several hours after the injection, the pain will likely return. It takes 3-7 days for steroids to work.  You may not notice any pain relief for at least one week.  If effective, we will often do a series of 2-3 injections spaced 3-6 weeks apart to maximally decrease your pain.  After the initial series, you may be a candidate for a more permanent nerve block of the facets.  If you have any questions, please call [phone] [facility] 

## 2016-04-17 NOTE — Progress Notes (Signed)
Subjective:  Patient ID: Paul Pruitt, male    DOB: 1966/01/21  Age: 50 y.o. MRN: [id number]  CC: Back Pain (low and bilaterally)   Service Provided on Last Visit: Evaluation (new pateint)  PROCEDURE:Left L3-4 L4-5 L5-S1 and S1 medial branch block to the facets under fluoroscopic guidance with moderate sedation  HPI Paul Pruitt for reevaluation today. The quality characteristic condition patient was pain are otherwise unchanged and he desires to proceed with his diagnostic facet block as previously reviewed.  On history he  is a pleasant 50 year old white male with long-standing history of low back pain. He has had previous surgery 2 with lumbar discectomy. He was recently seen by Dr. Earle Gell and referred to [place] for pain management. At present he is considered a non-surgical candidate however ultimately he may require a lower lumbar fusion. He is currently describing a centralized low back pain with occasional radiation into the posterior buttock and hip region. This is gradually been getting worse to the point that his maximum VAS score is a 9 and rarely achieves a best score of 3. His pain is worse in the morning and aggravated by bending sitting twisting or sneezing. Stretching seems to help and he does experience spasming of the lower legs with some intermittent weakness. The pain is described as sharp and annoying and intermittent in nature. He has had a recent CT myelogram. He has been unable to get any significant relief with conservative therapy and is referred accordingly. He denies problems of bowel or bladder dysfunction.  History Paul Pruitt has a past medical history of Melanoma ([place]).   He has a past surgical history that includes Hernia repair; Laminectomy; Tonsillectomy; Melanoma excision; Colonoscopy (02/07/2016); and Back surgery (N/A, 2004, 2005).   His family history includes Arthritis in his mother; Asthma in his father and son; Colon cancer in his mother; Hypertension in  his brother and father; Obesity in his brother and son.He reports that he has never smoked. He has never used smokeless tobacco. He reports that he drinks alcohol. He reports that he does not use drugs.  No results found for this or any previous visit.  ToxAssure Select 13  Date Value Ref Range Status  03/24/2016 FINAL  Final    Comment:    ==================================================================== TOXASSURE SELECT 13 (MW) ==================================================================== Test                             Result       Flag       Units   NO DRUGS DETECTED. ==================================================================== Test                      Result    Flag   Units      Ref Range   Creatinine              206              mg/dL      >=20 ==================================================================== Declared Medications:  The flagging and interpretation on this report are based on the  following declared medications.  Unexpected results may arise from  inaccuracies in the declared medications.  **Note: The testing scope of this panel does not include following  reported medications:  Ibuprofen (Advil)  Levothyroxine (Synthroid)  Multivitamin (MVI)  Supplement (Omega-3) ==================================================================== For clinical consultation, please call [phone]. ====================================================================     Outpatient Medications Prior to Visit  Medication  Sig Dispense Refill  . ibuprofen (ADVIL,MOTRIN) 800 MG tablet Take 1 tablet (800 mg total) by mouth every 8 (eight) hours as needed for moderate pain. 60 tablet 0  . Multiple Vitamins-Minerals (MULTIVITAMIN & MINERAL) LIQD Take by mouth.    . OMEGA-3 FATTY ACIDS PO Take 2 capsules by mouth daily.    Marland Kitchen levothyroxine (SYNTHROID, LEVOTHROID) 25 MCG tablet TAKE 1 TABLET BY MOUTH EVERY DAY (Patient not taking: Reported on 04/17/2016) 30  tablet 12   No facility-administered medications prior to visit.    Lab Results  Component Value Date   WBC 4.3 10/30/2015   HCT 41.7 10/30/2015   PLT 170 10/30/2015   GLUCOSE 89 10/30/2015   CHOL 145 10/30/2015   TRIG 33 10/30/2015   HDL 69 10/30/2015   LDLCALC 69 10/30/2015   ALT 18 10/30/2015   AST 22 10/30/2015   NA 141 10/30/2015   K 4.2 10/30/2015   CL 102 10/30/2015   CREATININE 0.98 10/30/2015   BUN 22 10/30/2015   CO2 25 10/30/2015   TSH 3.680 10/30/2015    --------------------------------------------------------------------------------------------------------------------- US Scrotum  Result Date: 12/26/2014 CLINICAL DATA:  Right testicular pain for the past 3 months EXAM: SCROTAL ULTRASOUND DOPPLER ULTRASOUND OF THE TESTICLES TECHNIQUE: Complete ultrasound examination of the testicles, epididymis, and other scrotal structures was performed. Color and spectral Doppler ultrasound were also utilized to evaluate blood flow to the testicles. COMPARISON:  None. FINDINGS: Right testicle Measurements: 5.5 x 3 x 3.6 cm. There is a 3 x 2 x 3 mm hyperechoic focus in the right testicle likely reflecting fat or non shadowing calcification. It exhibits no increased vascularity. Left testicle Measurements: 5 x 2.5 x 3.2 cm. There is a punctate hyperechoic focus compatible with calcification. Right epididymis: The right epididymis contains multiple cystic lesions with the largest measuring 2.6 x 1.4 x 2 cm. The epididymal vascularity is not abnormally increased. Left epididymis: The left epididymis contains cystic lesions with the largest measuring 5 mm in diameter. Vascularity of the left epididymis is normal. Hydrocele:  There are small bilateral hydroceles. Varicocele:  None visualized. Pulsed Doppler interrogation of both testes demonstrates normal low resistance arterial and venous waveforms bilaterally. IMPRESSION: 1. No abnormal testicular masses are observed. There are no findings to  suggest testicular torsion. 2. Multiple epididymal cysts greater on the right than on the left. There is no evidence of acute epididymitis. 3. Small bilateral hydroceles. Electronically Signed   By: David  Martinique M.D.   On: 12/26/2014 16:30   [place] Art/ven Flow Abd Pelv Doppler  Result Date: 12/26/2014 CLINICAL DATA:  Right testicular pain for the past 3 months EXAM: SCROTAL ULTRASOUND DOPPLER ULTRASOUND OF THE TESTICLES TECHNIQUE: Complete ultrasound examination of the testicles, epididymis, and other scrotal structures was performed. Color and spectral Doppler ultrasound were also utilized to evaluate blood flow to the testicles. COMPARISON:  None. FINDINGS: Right testicle Measurements: 5.5 x 3 x 3.6 cm. There is a 3 x 2 x 3 mm hyperechoic focus in the right testicle likely reflecting fat or non shadowing calcification. It exhibits no increased vascularity. Left testicle Measurements: 5 x 2.5 x 3.2 cm. There is a punctate hyperechoic focus compatible with calcification. Right epididymis: The right epididymis contains multiple cystic lesions with the largest measuring 2.6 x 1.4 x 2 cm. The epididymal vascularity is not abnormally increased. Left epididymis: The left epididymis contains cystic lesions with the largest measuring 5 mm in diameter. Vascularity of the left epididymis is normal. Hydrocele:  There are  small bilateral hydroceles. Varicocele:  None visualized. Pulsed Doppler interrogation of both testes demonstrates normal low resistance arterial and venous waveforms bilaterally. IMPRESSION: 1. No abnormal testicular masses are observed. There are no findings to suggest testicular torsion. 2. Multiple epididymal cysts greater on the right than on the left. There is no evidence of acute epididymitis. 3. Small bilateral hydroceles. Electronically Signed   By: David  Martinique M.D.   On: 12/26/2014 16:30        ---------------------------------------------------------------------------------------------------------------------- Past Medical History:  Diagnosis Date  . Melanoma [facility])     Past Surgical History:  Procedure Laterality Date  . BACK SURGERY N/A 2004, 2005   L3, 4 and L4,5  . COLONOSCOPY  02/07/2016   By dr Carlisle Beers polyps, internal hemorrhoids, waiting path report  . HERNIA REPAIR    . LAMINECTOMY     x 2  . MELANOMA EXCISION     chest area  . TONSILLECTOMY      Family History  Problem Relation Age of Onset  . Arthritis Mother   . Colon cancer Mother   . Hypertension Father   . Asthma Father   . Hypertension Brother   . Obesity Brother   . Asthma Son   . Obesity Son   . Kidney disease Neg Hx   . Prostate cancer Neg Hx     Social History  Substance Use Topics  . Smoking status: Never Smoker  . Smokeless tobacco: Never Used  . Alcohol use 0.0 oz/week     Comment: seldomly    ---------------------------------------------------------------------------------------------------------------------- Social History   Social History  . Marital status: Married    Spouse name: N/A  . Number of children: N/A  . Years of education: N/A   Social History Main Topics  . Smoking status: Never Smoker  . Smokeless tobacco: Never Used  . Alcohol use 0.0 oz/week     Comment: seldomly  . Drug use: No  . Sexual activity: Not Asked   Other Topics Concern  . None   Social History Narrative   Patient states heavy exercise. 7 days per week x 30-90 minutes.    Scheduled Meds: Continuous Infusions: PRN Meds:.   BP 123/74   Pulse (!) 58   Temp 98 F (36.7 C) (Oral)   Resp 16   Ht 6\' 4"  (1.93 m)   Wt 193 lb (87.5 kg)   SpO2 98%   BMI 23.49 kg/m    BP Readings from Last 3 Encounters:  04/17/16 123/74  03/24/16 125/80  10/30/15 110/68     Wt Readings from Last 3 Encounters:  04/17/16 193 lb (87.5 kg)  03/24/16 195 lb (88.5 kg)  10/30/15 195 lb  (88.5 kg)     ----------------------------------------------------------------------------------------------------------------------  ROS Review of Systems  Cardiac: Negative Pulmonary: Negative Neurologic: Scoliosis and as above Psychologic: Negative GI: Negative  Objective:  BP 123/74   Pulse (!) 58   Temp 98 F (36.7 C) (Oral)   Resp 16   Ht 6\' 4"  (1.93 m)   Wt 193 lb (87.5 kg)   SpO2 98%   BMI 23.49 kg/m   Physical Exam Patient is alert oriented cooperative compliant. He goes from seated to standing with considerable discomfort. Upper extremity strength is at baseline rated at 5 over 5 with good muscle tone and bulk to the upper extremities. Inspection of the low back a well-healed midline scar with significant paraspinous muscle tenderness bilaterally. With extension at the low back and left lateral rotation and loading he does  reproduce his primary pain complaint. This is less pronounced with right extension.    Assessment & Plan:   Paul Pruitt was seen today for back pain.  Diagnoses and all orders for this visit:  Facet arthritis, degenerative, lumbar spine -     triamcinolone acetonide (KENALOG-40) injection 40 mg; 1 mL (40 mg total) by Other route once. -     sodium chloride flush (NS) 0.9 % injection 10 mL; 10 mLs by Other route once. -     ropivacaine (PF) 2 mg/ml (0.2%) (NAROPIN) epidural 10 mL; 10 mLs by Epidural route once. -     midazolam (VERSED) 5 MG/5ML injection 5 mg; Inject 5 mLs (5 mg total) into the vein once. -     lidocaine (PF) (XYLOCAINE) 1 % injection 5 mL; Inject 5 mLs into the skin once. -     lactated ringers infusion 1,000 mL; Inject 1,000 mLs into the vein continuous. -     LUMBAR FACET(MEDIAL BRANCH NERVE BLOCK) MBNB; Future  Low back derangement syndrome  DDD (degenerative disc disease), lumbar  Other orders -     ropivacaine (PF) 2 mg/ml (0.2%) (NAROPIN) 2 MG/ML epidural;  -     triamcinolone acetonide (KENALOG-40) 40 MG/ML injection;   -     midazolam (VERSED) 5 MG/5ML injection;      ----------------------------------------------------------------------------------------------------------------------  Problem List Items Addressed This Visit    None    Visit Diagnoses    Facet arthritis, degenerative, lumbar spine    -  Primary   Relevant Medications   triamcinolone acetonide (KENALOG-40) injection 40 mg   sodium chloride flush (NS) 0.9 % injection 10 mL   ropivacaine (PF) 2 mg/ml (0.2%) (NAROPIN) epidural 10 mL   midazolam (VERSED) 5 MG/5ML injection 5 mg   lidocaine (PF) (XYLOCAINE) 1 % injection 5 mL   lactated ringers infusion 1,000 mL   triamcinolone acetonide (KENALOG-40) 40 MG/ML injection (Completed)   Other Relevant Orders   LUMBAR FACET(MEDIAL BRANCH NERVE BLOCK) MBNB   Low back derangement syndrome       Relevant Medications   triamcinolone acetonide (KENALOG-40) injection 40 mg   triamcinolone acetonide (KENALOG-40) 40 MG/ML injection (Completed)   DDD (degenerative disc disease), lumbar       Relevant Medications   triamcinolone acetonide (KENALOG-40) injection 40 mg   triamcinolone acetonide (KENALOG-40) 40 MG/ML injection (Completed)      ----------------------------------------------------------------------------------------------------------------------  1. Facet arthritis, degenerative, lumbar spine Based on clinical findings we will plan on a diagnostic left side lumbar facet block Today. The risks and benefits of an reviewed and full detail all questions answered. He is to return to clinic in 1 month for reevaluation and possible repeat injection at that time. 2. DDD (degenerative disc disease), lumbar He may be a candidate for caudal epidural steroid injection to assist with some of the intermittent radicular symptoms he's experiencing.  3. Low back derangement syndrome As above. Furthermore we have addressed the issues regarding exercise and core strengthening today. - LUMBAR  FACET(MEDIAL BRANCH NERVE BLOCK) MBNB; Future    ----------------------------------------------------------------------------------------------------------------------  I am having Mr. Paul Pruitt maintain his OMEGA-3 FATTY ACIDS PO, MULTIVITAMIN & MINERAL, ibuprofen, and levothyroxine. We administered ropivacaine (PF) 2 mg/ml (0.2%), triamcinolone acetonide, and midazolam. We will continue to administer triamcinolone acetonide, sodium chloride flush, ropivacaine (PF) 2 mg/ml (0.2%), midazolam, lidocaine (PF), and lactated ringers.   Meds ordered this encounter  Medications  . triamcinolone acetonide (KENALOG-40) injection 40 mg  . sodium chloride flush (NS) 0.9 % injection  10 mL  . ropivacaine (PF) 2 mg/ml (0.2%) (NAROPIN) epidural 10 mL  . midazolam (VERSED) 5 MG/5ML injection 5 mg  . lidocaine (PF) (XYLOCAINE) 1 % injection 5 mL  . lactated ringers infusion 1,000 mL  . ropivacaine (PF) 2 mg/ml (0.2%) (NAROPIN) 2 MG/ML epidural    Donneta Romberg, Dena: cabinet override  . triamcinolone acetonide (KENALOG-40) 40 MG/ML injection    Donneta Romberg, Dena: cabinet override  . midazolam (VERSED) 5 MG/5ML injection    Donneta Romberg, Dena: cabinet override    Procedure: Left side lumbar facet blocks 1 at L34 L4-5 L5-S1 and S1 under fluoroscopic guidance with moderate sedation  Patient was taken to the fluoroscopy suite and placed in prone position. A total dose of 2 mg of Versed with 0 cc of fentanyl were titrated for moderate sedation. Vital signs were stable throughout the procedure. The  overlying area of skin on the  left side was prepped with Betadine 3 and strict aseptic technique was utilized throughout the procedure. Flouroscopy was used to I identify the areas overlying the aforementioned facets at L3-4  L4-5 and  L5-S1. 1% lidocaine 1 cc was infiltrated subcutaneously and into the fascia with a 25-gauge needle at each of these sites. I then advanced a 22-gauge 3-1/2 inch Quinckie needle with the needle tip  to lie at the "[facility]" portion of the [employer] dog". There was negative aspiration for heme or CSF and  no paresthesia. I then injected 2 cc of ropivacaine 0.2% mixed with10 mg of triamcinolone at each of the aforementioned sites. These needles were withdrawn and the L5-S1 needle was redirected towards the S1 posterior foramen. This was done without  paresthesia, there was negative aspiration and 2 cc of this same mixture was injected at this site. The patient was convalesced discharged home stable condition for follow-up as mentioned.   Follow-up: Return for evaluation, procedure.    Molli Barrows, MD  This dictation was performed utilizing Dragon voice recognition software.  Please excuse any unintentional or mistaken typographical errors as a result of its unedited utilization.

## 2016-05-06 DIAGNOSIS — M5136 Other intervertebral disc degeneration, lumbar region: Secondary | ICD-10-CM | POA: Diagnosis not present

## 2016-05-06 DIAGNOSIS — M545 Low back pain: Secondary | ICD-10-CM | POA: Diagnosis not present

## 2016-05-20 ENCOUNTER — Ambulatory Visit: Payer: BLUE CROSS/BLUE SHIELD | Admitting: Anesthesiology

## 2016-08-12 DIAGNOSIS — H2512 Age-related nuclear cataract, left eye: Secondary | ICD-10-CM | POA: Diagnosis not present

## 2016-08-19 DIAGNOSIS — H2511 Age-related nuclear cataract, right eye: Secondary | ICD-10-CM | POA: Diagnosis not present

## 2016-10-30 ENCOUNTER — Ambulatory Visit (INDEPENDENT_AMBULATORY_CARE_PROVIDER_SITE_OTHER): Payer: BLUE CROSS/BLUE SHIELD | Admitting: Family Medicine

## 2016-10-30 ENCOUNTER — Encounter: Payer: Self-pay | Admitting: Family Medicine

## 2016-10-30 VITALS — BP 108/62 | HR 64 | Temp 97.9°F | Resp 12 | Ht 76.75 in | Wt 196.0 lb

## 2016-10-30 DIAGNOSIS — Z0000 Encounter for general adult medical examination without abnormal findings: Secondary | ICD-10-CM

## 2016-10-30 DIAGNOSIS — Z125 Encounter for screening for malignant neoplasm of prostate: Secondary | ICD-10-CM

## 2016-10-30 DIAGNOSIS — Z1211 Encounter for screening for malignant neoplasm of colon: Secondary | ICD-10-CM | POA: Diagnosis not present

## 2016-10-30 LAB — HEMOCCULT GUIAC POC 1CARD (OFFICE): Fecal Occult Blood, POC: NEGATIVE

## 2016-10-30 LAB — POCT URINALYSIS DIPSTICK
Bilirubin, UA: NEGATIVE
Blood, UA: NEGATIVE
Glucose, UA: NEGATIVE
KETONES UA: NEGATIVE
LEUKOCYTES UA: NEGATIVE
Nitrite, UA: NEGATIVE
PH UA: 7 (ref 5.0–8.0)
SPEC GRAV UA: 1.015 (ref 1.010–1.025)
UROBILINOGEN UA: 0.2 U/dL

## 2016-10-30 NOTE — Progress Notes (Addendum)
Patient: Paul Pruitt, Male    DOB: Oct 30, 1965, 51 y.o.   MRN: [id number] Visit Date: 10/30/2016  Today's Provider: Wilhemena Durie, MD   Chief Complaint  Patient presents with  . Annual Exam   Subjective:  Paul Pruitt is a 51 y.o. male who presents today for health maintenance and complete physical. He feels well. He reports exercising run 15 miles a week, lifting free weights. He reports he is sleeping well.  Last colonoscopy 02/07/16 hyperplastic polyps, repeat in 5 years. Tdap 07/21/07  Review of Systems  Constitutional: Positive for fatigue (some).  HENT: Negative.   Eyes: Negative.   Respiratory: Negative.   Cardiovascular: Negative.   Gastrointestinal: Negative.   Endocrine: Negative.   Genitourinary: Positive for frequency.  Musculoskeletal: Positive for back pain (sometimes).       Joint stiffness at times.  Skin: Negative.   Allergic/Immunologic: Negative.   Neurological: Negative.   Hematological: Negative.   Psychiatric/Behavioral: Negative.     Social History   Social History  . Marital status: Married    Spouse name: N/A  . Number of children: N/A  . Years of education: N/A   Occupational History  . Not on file.   Social History Main Topics  . Smoking status: Never Smoker  . Smokeless tobacco: Never Used  . Alcohol use 0.0 oz/week     Comment: seldomly-3 drinks a year maybe  . Drug use: No  . Sexual activity: Yes    Birth control/ protection: None   Other Topics Concern  . Not on file   Social History Narrative   Patient states heavy exercise. 7 days per week x 30-90 minutes.    Patient Active Problem List   Diagnosis Date Noted  . Screening examination for poliomyelitis 01/23/2015  . BPH with obstruction/lower urinary tract symptoms 12/21/2014  . Testicular pain, right 12/21/2014  . Anxiety 11/21/2014  . Chest pain 11/21/2014  . Hypothyroidism 11/21/2014  . Vitamin D deficiency 11/21/2014  . Hx of colonic polyp 11/21/2014  .  Nocturia 11/21/2014  . Encounter for general adult medical examination without abnormal findings 10/17/2013  . Encounter for screening for malignant neoplasm of colon 10/17/2013  . Encounter for screening for malignant neoplasm of prostate 10/17/2013  . Acid reflux 02/15/2002    Past Surgical History:  Procedure Laterality Date  . BACK SURGERY N/A 2004, 2005   L3, 4 and L4,5  . CATARACT EXTRACTION, BILATERAL     08/12/16 and 08/19/16.  Marland Kitchen COLONOSCOPY  02/07/2016   By dr Carlisle Beers polyps, internal hemorrhoids, waiting path report  . HERNIA REPAIR    . LAMINECTOMY     x 2  . MELANOMA EXCISION     chest area  . TONSILLECTOMY      His family history includes Arthritis in his mother; Asthma in his father and son; Colon cancer in his mother; Hypertension in his brother and father; Obesity in his brother and son.     Outpatient Encounter Prescriptions as of 10/30/2016  Medication Sig Note  . Multiple Vitamins-Minerals (MULTIVITAMIN & MINERAL) LIQD Take by mouth. 11/22/2014: Received from: [place]: Take by mouth.  . OMEGA-3 FATTY ACIDS PO Take 2 capsules by mouth daily. 300 mg 11/22/2014: Received from: [place]: Take by mouth.  Marland Kitchen ibuprofen (ADVIL,MOTRIN) 800 MG tablet Take 1 tablet (800 mg total) by mouth every 8 (eight) hours as needed for moderate pain. (Patient not taking: Reported on 10/30/2016)   . [  DISCONTINUED] levothyroxine (SYNTHROID, LEVOTHROID) 25 MCG tablet TAKE 1 TABLET BY MOUTH EVERY DAY (Patient not taking: Reported on 04/17/2016)    Facility-Administered Encounter Medications as of 10/30/2016  Medication  . lactated ringers infusion 1,000 mL  . lidocaine (PF) (XYLOCAINE) 1 % injection 5 mL  . midazolam (VERSED) 5 MG/5ML injection 5 mg  . ropivacaine (PF) 2 mg/ml (0.2%) (NAROPIN) epidural 10 mL  . sodium chloride flush (NS) 0.9 % injection 10 mL  . triamcinolone acetonide (KENALOG-40) injection 40 mg    Patient  Care Team: Jerrol Banana., MD as PCP - General (Family Medicine)      Objective:   Vitals:  Vitals:   10/30/16 0915  BP: 108/62  Pulse: 64  Resp: 12  Temp: 97.9 F (36.6 C)  Weight: 196 lb (88.9 kg)  Height: 6' 4.75" (1.949 m)    Physical Exam  Constitutional: He is oriented to person, place, and time. He appears well-developed and well-nourished.  HENT:  Head: Normocephalic and atraumatic.  Right Ear: External ear normal.  Left Ear: External ear normal.  Mouth/Throat: Oropharynx is clear and moist.  Eyes: Conjunctivae are normal. Pupils are equal, round, and reactive to light.  Neck: Normal range of motion. Neck supple.  Cardiovascular: Normal rate, regular rhythm, normal heart sounds and intact distal pulses.  Exam reveals no gallop.   No murmur heard. Pulmonary/Chest: Effort normal and breath sounds normal. No respiratory distress. He has no wheezes.  Abdominal: Soft. He exhibits no distension. There is no tenderness.  Genitourinary: Rectum normal, prostate normal and penis normal. Rectal exam shows guaiac negative stool. No penile tenderness.  Musculoskeletal: He exhibits no edema or tenderness.  Neurological: He is alert and oriented to person, place, and time. No cranial nerve deficit. Coordination normal.  Skin: No rash noted. No erythema.  Psychiatric: He has a normal mood and affect. His behavior is normal. Judgment and thought content normal.   Depression Screen PHQ 2/9 Scores 10/30/2016 04/17/2016 03/24/2016 10/30/2015  PHQ - 2 Score 0 0 0 0  PHQ- 9 Score 1 - - -   Assessment & Plan:   1. Annual physical exam Good health. - CBC with Differential/Platelet - Comprehensive metabolic panel - Lipid Panel With LDL/HDL Ratio - TSH - POCT Urinalysis Dipstick  2. Colon cancer screening - POCT Occult Blood Stool  3. Prostate cancer screening - PSA 4.s/p Bilateral cataract Surgery 2018  HPI, Exam and A&P transcribed by Theressa Millard, RMA under  direction and in the presence of Miguel Aschoff, MD. I have done the exam and reviewed the chart and it is accurate to the best of my knowledge. Development worker, community has been used and  any errors in dictation or transcription are unintentional. Miguel Aschoff M.D. [facility]

## 2016-10-31 LAB — CBC WITH DIFFERENTIAL/PLATELET
BASOS ABS: 0 10*3/uL (ref 0.0–0.2)
Basos: 0 %
EOS (ABSOLUTE): 0 10*3/uL (ref 0.0–0.4)
Eos: 1 %
Hematocrit: 45 % (ref 37.5–51.0)
Hemoglobin: 14.8 g/dL (ref 13.0–17.7)
Immature Grans (Abs): 0 10*3/uL (ref 0.0–0.1)
Immature Granulocytes: 0 %
LYMPHS ABS: 1.1 10*3/uL (ref 0.7–3.1)
Lymphs: 22 %
MCH: 29 pg (ref 26.6–33.0)
MCHC: 32.9 g/dL (ref 31.5–35.7)
MCV: 88 fL (ref 79–97)
MONOS ABS: 0.4 10*3/uL (ref 0.1–0.9)
Monocytes: 9 %
NEUTROS ABS: 3.4 10*3/uL (ref 1.4–7.0)
Neutrophils: 68 %
PLATELETS: 193 10*3/uL (ref 150–379)
RBC: 5.1 x10E6/uL (ref 4.14–5.80)
RDW: 12.7 % (ref 12.3–15.4)
WBC: 5 10*3/uL (ref 3.4–10.8)

## 2016-10-31 LAB — COMPREHENSIVE METABOLIC PANEL
A/G RATIO: 1.9 (ref 1.2–2.2)
ALBUMIN: 4.6 g/dL (ref 3.5–5.5)
ALK PHOS: 66 IU/L (ref 39–117)
ALT: 24 IU/L (ref 0–44)
AST: 25 IU/L (ref 0–40)
BILIRUBIN TOTAL: 1.1 mg/dL (ref 0.0–1.2)
BUN / CREAT RATIO: 20 (ref 9–20)
BUN: 23 mg/dL (ref 6–24)
CHLORIDE: 103 mmol/L (ref 96–106)
CO2: 27 mmol/L (ref 18–29)
Calcium: 9.7 mg/dL (ref 8.7–10.2)
Creatinine, Ser: 1.16 mg/dL (ref 0.76–1.27)
GFR calc Af Amer: 85 mL/min/{1.73_m2} (ref >59)
GFR calc non Af Amer: 74 mL/min/{1.73_m2} (ref >59)
GLUCOSE: 90 mg/dL (ref 65–99)
Globulin, Total: 2.4 g/dL (ref 1.5–4.5)
POTASSIUM: 4.9 mmol/L (ref 3.5–5.2)
Sodium: 143 mmol/L (ref 134–144)
Total Protein: 7 g/dL (ref 6.0–8.5)

## 2016-10-31 LAB — LIPID PANEL WITH LDL/HDL RATIO
CHOLESTEROL TOTAL: 152 mg/dL (ref 100–199)
HDL: 66 mg/dL (ref >39)
LDL Calculated: 75 mg/dL (ref 0–99)
LDl/HDL Ratio: 1.1 ratio (ref 0.0–3.6)
Triglycerides: 55 mg/dL (ref 0–149)
VLDL CHOLESTEROL CAL: 11 mg/dL (ref 5–40)

## 2016-10-31 LAB — TSH: TSH: 4.01 u[IU]/mL (ref 0.450–4.500)

## 2016-10-31 LAB — PSA: PROSTATE SPECIFIC AG, SERUM: 0.5 ng/mL (ref 0.0–4.0)

## 2016-11-05 ENCOUNTER — Telehealth: Payer: Self-pay

## 2016-11-05 NOTE — Telephone Encounter (Signed)
-----   Message from Jerrol Banana., MD sent at 11/04/2016 10:17 AM EDT ----- Labs all good.

## 2016-11-05 NOTE — Telephone Encounter (Signed)
LMTCB 11/05/2016  Thanks,   -Blessyn [place]  

## 2017-01-01 DIAGNOSIS — D2239 Melanocytic nevi of other parts of face: Secondary | ICD-10-CM | POA: Diagnosis not present

## 2017-01-01 DIAGNOSIS — D485 Neoplasm of uncertain behavior of skin: Secondary | ICD-10-CM | POA: Diagnosis not present

## 2017-01-01 DIAGNOSIS — L720 Epidermal cyst: Secondary | ICD-10-CM | POA: Diagnosis not present

## 2017-01-01 DIAGNOSIS — L821 Other seborrheic keratosis: Secondary | ICD-10-CM | POA: Diagnosis not present

## 2017-01-01 DIAGNOSIS — Z85820 Personal history of malignant melanoma of skin: Secondary | ICD-10-CM | POA: Diagnosis not present

## 2017-01-01 DIAGNOSIS — Z1283 Encounter for screening for malignant neoplasm of skin: Secondary | ICD-10-CM | POA: Diagnosis not present

## 2017-02-23 ENCOUNTER — Ambulatory Visit: Payer: Self-pay | Admitting: Medical

## 2017-02-23 ENCOUNTER — Encounter: Payer: Self-pay | Admitting: Medical

## 2017-02-23 VITALS — BP 128/78 | HR 64 | Temp 97.6°F | Wt 195.6 lb

## 2017-02-23 DIAGNOSIS — J029 Acute pharyngitis, unspecified: Secondary | ICD-10-CM

## 2017-02-23 DIAGNOSIS — H6982 Other specified disorders of Eustachian tube, left ear: Secondary | ICD-10-CM

## 2017-02-23 DIAGNOSIS — H6691 Otitis media, unspecified, right ear: Secondary | ICD-10-CM

## 2017-02-23 MED ORDER — AMOXICILLIN-POT CLAVULANATE 875-125 MG PO TABS: 1.0000 | ORAL_TABLET | Freq: Two times a day (BID) | ORAL | 0 refills | Status: DC

## 2017-02-23 NOTE — Progress Notes (Addendum)
   Subjective:    Patient ID: Paul Pruitt, male    DOB: 05-03-1966, 51 y.o.   MRN: [id number]  HPI 51 yo male comes into 2 week history of sore throat and left ear pain with swallowing. Also had right ear but that  Seemed to  Have resolved. Traveling [place] vacation in one week.. Currently taining for 1/2 marathon running 5- 8 miles a day. Every other day. Race is Saturday.   Review of Systems  Constitutional: Positive for chills and fatigue. Negative for fever.  HENT: Positive for ear pain and sore throat. Negative for congestion, ear discharge, hearing loss, postnasal drip, rhinorrhea, sinus pain, sinus pressure, tinnitus and voice change.   Eyes: Negative for discharge.  Respiratory: Positive for cough. Negative for shortness of breath and wheezing.   Cardiovascular: Positive for palpitations. Negative for chest pain and leg swelling.  Gastrointestinal: Negative for abdominal pain, diarrhea, nausea and vomiting.  Endocrine: Negative for polydipsia, polyphagia and polyuria.  Genitourinary: Negative for dysuria and hematuria.  Musculoskeletal: Positive for back pain and myalgias.  Skin: Negative for rash.  Allergic/Immunologic: Negative for environmental allergies and food allergies.  Neurological: Negative for dizziness, syncope, light-headedness and headaches.  Hematological: Negative for adenopathy.  Psychiatric/Behavioral: Negative for behavioral problems, sleep disturbance and suicidal ideas. The patient is not nervous/anxious.    Chills about a week ago.   some palpatations  History of back pain after 2 surgeries, L3-4 and L4-5 discectomy Objective:   Physical Exam  Constitutional: He is oriented to person, place, and time. He appears well-developed and well-nourished.  HENT:  Head: Normocephalic and atraumatic.  Right Ear: Hearing, external ear and ear canal normal. Tympanic membrane is erythematous. A middle ear effusion is present.  Left Ear: Hearing, external ear and ear  canal normal. A middle ear effusion is present.  Nose: Nose normal.  Mouth/Throat: Uvula is midline. Posterior oropharyngeal erythema present.  Eyes: Pupils are equal, round, and reactive to light. Conjunctivae and EOM are normal.  Neck: Normal range of motion. Neck supple.  Cardiovascular: Normal rate and regular rhythm.  Exam reveals friction rub. Exam reveals no gallop.   No murmur heard. Pulmonary/Chest: Effort normal and breath sounds normal.  Musculoskeletal: Normal range of motion.  Neurological: He is alert and oriented to person, place, and time.  Skin: Skin is warm and dry.  Psychiatric: He has a normal mood and affect. His behavior is normal. Judgment and thought content normal.  Nursing note and vitals reviewed.         Assessment & Plan:  Pharyngitis, otitis media right Recommended OTC Zyrtec or Claritin at night , take as directed. Dilute salt water gargles 4 x/day. Soft foods, OTC Ibuprofen or Tylenol take as directed.  Meds ordered this encounter  Medications  . amoxicillin-clavulanate (AUGMENTIN) 875-125 MG tablet    Sig: Take 1 tablet by mouth 2 (two) times daily.    Dispense:  20 tablet    Refill:  0  return to the clinic in  3-5 days if not improving.

## 2017-02-23 NOTE — Patient Instructions (Signed)
Pharyngitis  Pharyngitis is a sore throat (pharynx). There is redness, pain, and swelling of your throat.  Follow these instructions at home:  Drink enough fluids to keep your pee (urine) clear or pale yellow.  Only take medicine as told by your doctor.  You may get sick again if you do not take medicine as told. Finish your medicines, even if you start to feel better.  Do not take aspirin.  Rest.  Rinse your mouth (gargle) with salt water (½ tsp of salt per 1 qt of water) every 1-2 hours. This will help the pain.  If you are not at risk for choking, you can suck on hard candy or sore throat lozenges.  Contact a doctor if:  You have large, tender lumps on your neck.  You have a rash.  You cough up green, yellow-brown, or bloody spit.  Get help right away if:  You have a stiff neck.  You drool or cannot swallow liquids.  You throw up (vomit) or are not able to keep medicine or liquids down.  You have very bad pain that does not go away with medicine.  You have problems breathing (not from a stuffy nose).  This information is not intended to replace advice given to you by your health care provider. Make sure you discuss any questions you have with your health care provider.  Document Released: 11/19/2007 Document Revised: 11/08/2015 Document Reviewed: 02/07/2013  Elsevier Interactive Patient Education © 2017 [employer] Inc[employer]  Otitis Media, Adult  Otitis media is redness, soreness, and puffiness (swelling) in the space just behind your eardrum (middle ear). It may be caused by allergies or infection. It often happens along with a cold.  Follow these instructions at home:  · Take your medicine as told. Finish it even if you start to feel better.  · Only take over-the-counter or prescription medicines for pain, discomfort, or fever as told by your doctor.  · Follow up with your doctor as told.  Contact a doctor if:  · You have otitis media only in one ear, or bleeding from your nose, or both.  · You notice a lump on your  neck.  · You are not getting better in 3-5 days.  · You feel worse instead of better.  Get help right away if:  · You have pain that is not helped with medicine.  · You have puffiness, redness, or pain around your ear.  · You get a stiff neck.  · You cannot move part of your face (paralysis).  · You notice that the bone behind your ear hurts when you touch it.  This information is not intended to replace advice given to you by your health care provider. Make sure you discuss any questions you have with your health care provider.  Document Released: 11/19/2007 Document Revised: 11/08/2015 Document Reviewed: 12/28/2012  Elsevier Interactive Patient Education © 2017 Else[employer] Inc.

## 2017-03-25 DIAGNOSIS — K645 Perianal venous thrombosis: Secondary | ICD-10-CM | POA: Diagnosis not present

## 2017-05-26 ENCOUNTER — Ambulatory Visit: Payer: Self-pay | Admitting: Medical

## 2017-05-26 ENCOUNTER — Encounter: Payer: Self-pay | Admitting: Medical

## 2017-05-26 VITALS — BP 128/70 | HR 77 | Temp 98.5°F | Resp 18 | Wt 199.8 lb

## 2017-05-26 DIAGNOSIS — B349 Viral infection, unspecified: Secondary | ICD-10-CM

## 2017-05-26 NOTE — Progress Notes (Signed)
   Subjective:    Patient ID: Paul Pruitt, male    DOB: 1966/05/14, 51 y.o.   MRN: [id number]  HPI  51 yo male in non acute distress, starting with Friday with sore throat now resolved. Did not feel well. Did go to basketball game, ran on Saturday, felt worse in the afternoon on Saturday ( body aches).. Not sure if he had a  fever, coughing nonproductive. Nasal congestion, clear. Body aches and sore throat resolved.  Feeling better today. Would like to be well for a  5 K race on Saturday.   Review of Systems  Constitutional: Negative for chills and fever.  HENT: Positive for congestion, rhinorrhea and sneezing. Negative for ear pain, sinus pressure, sinus pain, sore throat and tinnitus.   Eyes: Positive for itching. Negative for discharge.  Respiratory: Positive for cough and shortness of breath (winded with walking his [place]).   Cardiovascular: Negative for chest pain.  Gastrointestinal: Negative for abdominal pain.  Endocrine: Negative for polydipsia, polyphagia and polyuria.  Genitourinary: Negative for dysuria.  Musculoskeletal: Negative for myalgias.  Skin: Negative for rash.  Allergic/Immunologic: Negative for environmental allergies and food allergies.  Neurological: Negative for dizziness, speech difficulty and light-headedness.  Hematological: Negative for adenopathy.  Psychiatric/Behavioral: Negative for behavioral problems, self-injury and suicidal ideas. The patient is not nervous/anxious.    anxieity nothing new.    Objective:   Physical Exam  Constitutional: He is oriented to person, place, and time. He appears well-developed and well-nourished.  HENT:  Head: Normocephalic and atraumatic.  Right Ear: Hearing and external ear normal.  Left Ear: Hearing, tympanic membrane, external ear and ear canal normal.  Nose: Nose normal.  Mouth/Throat: Uvula is midline, oropharynx is clear and moist and mucous membranes are normal.  Eyes: Conjunctivae and EOM are normal.  Pupils are equal, round, and reactive to light.  Neck: Trachea normal and normal range of motion. Neck supple.  Cardiovascular: Normal rate, regular rhythm and normal heart sounds.  Pulmonary/Chest: Effort normal and breath sounds normal.  Neurological: He is alert and oriented to person, place, and time.  Skin: Skin is warm and dry.  Psychiatric: He has a normal mood and affect. His behavior is normal. Judgment and thought content normal.  Nursing note and vitals reviewed.   Right ear cerumen obscuring TM      Assessment & Plan:  Viral illness, Rest, increase fluids, OTC  Motrin or Tylenol take as directed. OTC Flonase and Claritin or Zyrtec take as directed.  Call if nasal discharge or  cough becomes productive yellow or green for antibiotics. Return to the clinic as needed.  Patient verbalizes understanding and has no questions at discharge.

## 2017-05-26 NOTE — Patient Instructions (Signed)
Rest  , fluids,  OTC motrin or tylenol take as directed.  Viral Illness, Adult Viruses are tiny germs that can get into a person's body and cause illness. There are many different types of viruses, and they cause many types of illness. Viral illnesses can range from mild to severe. They can affect various parts of the body. Common illnesses that are caused by a virus include colds and the flu. Viral illnesses also include serious conditions such as HIV/AIDS (human immunodeficiency virus/acquired immunodeficiency syndrome). A few viruses have been linked to certain cancers. What are the causes? Many types of viruses can cause illness. Viruses invade cells in your body, multiply, and cause the infected cells to malfunction or die. When the cell dies, it releases more of the virus. When this happens, you develop symptoms of the illness, and the virus continues to spread to other cells. If the virus takes over the function of the cell, it can cause the cell to divide and grow out of control, as is the case when a virus causes cancer. Different viruses get into the body in different ways. You can get a virus by:  Swallowing food or water that is contaminated with the virus.  Breathing in droplets that have been coughed or sneezed into the air by an infected person.  Touching a surface that has been contaminated with the virus and then touching your eyes, nose, or mouth.  Being bitten by an insect or animal that carries the virus.  Having sexual contact with a person who is infected with the virus.  Being exposed to blood or fluids that contain the virus, either through an open cut or during a transfusion.  If a virus enters your body, your body's defense system (immune system) will try to fight the virus. You may be at higher risk for a viral illness if your immune system is weak. What are the signs or symptoms? Symptoms vary depending on the type of virus and the location of the cells that it  invades. Common symptoms of the main types of viral illnesses include: Cold and flu viruses  Fever.  Headache.  Sore throat.  Muscle aches.  Nasal congestion.  Cough. Digestive system (gastrointestinal) viruses  Fever.  Abdominal pain.  Nausea.  Diarrhea. Liver viruses (hepatitis)  Loss of appetite.  Tiredness.  Yellowing of the skin (jaundice). Brain and spinal cord viruses  Fever.  Headache.  Stiff neck.  Nausea and vomiting.  Confusion or sleepiness. Skin viruses  Warts.  Itching.  Rash. Sexually transmitted viruses  Discharge.  Swelling.  Redness.  Rash. How is this treated? Viruses can be difficult to treat because they live within cells. Antibiotic medicines do not treat viruses because these drugs do not get inside cells. Treatment for a viral illness may include:  Resting and drinking plenty of fluids.  Medicines to relieve symptoms. These can include over-the-counter medicine for pain and fever, medicines for cough or congestion, and medicines to relieve diarrhea.  Antiviral medicines. These drugs are available only for certain types of viruses. They may help reduce flu symptoms if taken early. There are also many antiviral medicines for hepatitis and HIV/AIDS.  Some viral illnesses can be prevented with vaccinations. A common example is the flu shot. Follow these instructions at home: Medicines   Take over-the-counter and prescription medicines only as told by your health care provider.  If you were prescribed an antiviral medicine, take it as told by your health care provider. Do not stop  taking the medicine even if you start to feel better.  Be aware of when antibiotics are needed and when they are not needed. Antibiotics do not treat viruses. If your health care provider thinks that you may have a bacterial infection as well as a viral infection, you may get an antibiotic. ? Do not ask for an antibiotic prescription if you have  been diagnosed with a viral illness. That will not make your illness go away faster. ? Frequently taking antibiotics when they are not needed can lead to antibiotic resistance. When this develops, the medicine no longer works against the bacteria that it normally fights. General instructions  Drink enough fluids to keep your urine clear or pale yellow.  Rest as much as possible.  Return to your normal activities as told by your health care provider. Ask your health care provider what activities are safe for you.  Keep all follow-up visits as told by your health care provider. This is important. How is this prevented? Take these actions to reduce your risk of viral infection:  Eat a healthy diet and get enough rest.  Wash your hands often with soap and water. This is especially important when you are in public places. If soap and water are not available, use hand sanitizer.  Avoid close contact with friends and family who have a viral illness.  If you travel to areas where viral gastrointestinal infection is common, avoid drinking water or eating raw food.  Keep your immunizations up to date. Get a flu shot every year as told by your health care provider.  Do not share toothbrushes, nail clippers, razors, or needles with other people.  Always practice safe sex.  Contact a health care provider if:  You have symptoms of a viral illness that do not go away.  Your symptoms come back after going away.  Your symptoms get worse. Get help right away if:  You have trouble breathing.  You have a severe headache or a stiff neck.  You have severe vomiting or abdominal pain. This information is not intended to replace advice given to you by your health care provider. Make sure you discuss any questions you have with your health care provider. Document Released: 10/12/2015 Document Revised: 11/14/2015 Document Reviewed: 10/12/2015 Elsevier Interactive Patient Education  [employer].

## 2017-05-26 NOTE — Progress Notes (Signed)
Pt presents to clinic for sinus congestion/ drainage since Saturday (4 days). Some coughing with drainage. Drainage clear in color. Endorsed sore throat and headache at onset on symptoms but has since resolved.   Subjective:    Patient ID: Paul Pruitt, male    DOB: 07/19/1965, 51 y.o.   MRN: [id number]  HPI    Review of Systems     Objective:   Physical Exam        Assessment & Plan:

## 2017-07-03 ENCOUNTER — Encounter: Payer: Self-pay | Admitting: Internal Medicine

## 2017-07-03 ENCOUNTER — Ambulatory Visit: Payer: BLUE CROSS/BLUE SHIELD | Admitting: Internal Medicine

## 2017-07-03 DIAGNOSIS — Z800 Family history of malignant neoplasm of digestive organs: Secondary | ICD-10-CM | POA: Diagnosis not present

## 2017-07-03 DIAGNOSIS — R5383 Other fatigue: Secondary | ICD-10-CM

## 2017-07-03 DIAGNOSIS — Z9849 Cataract extraction status, unspecified eye: Secondary | ICD-10-CM | POA: Diagnosis not present

## 2017-07-03 DIAGNOSIS — R519 Headache, unspecified: Secondary | ICD-10-CM

## 2017-07-03 DIAGNOSIS — E559 Vitamin D deficiency, unspecified: Secondary | ICD-10-CM

## 2017-07-03 DIAGNOSIS — R51 Headache: Secondary | ICD-10-CM | POA: Diagnosis not present

## 2017-07-03 DIAGNOSIS — Z86010 Personal history of colonic polyps: Secondary | ICD-10-CM

## 2017-07-03 NOTE — Progress Notes (Signed)
Patient ID: Paul Pruitt, male   DOB: 13-Apr-1966, 52 y.o.   MRN: [id number]   Subjective:    Patient ID: Paul Pruitt, male    DOB: 12/08/1965, 51 y.o.   MRN: [id number]  HPI  Patient here to establish care.  Former pt of Dr Rosanna Randy.  Has been healthy.  Has a family history of colon cancer and a personal h/o polyps.  Up to date with colonoscopy.  Stays active. Exercises regularly.  No chest pain.  No sob.  Occasional cough, but nothing persistent.  No acid reflux.  No abdominal pain or cramping.  Bowels stable.  Does report having issues with headaches recently.  Started with the snow storm and have been a persistent intermittent issue since.  Localized  - right side - over the right eye.  Occasionally there when he wakes up.  Has noticed when he runs and works out on the elliptical machine, some increased headaches.  Has been taking ibuprofen q 8 hours when occurs.  Has a stressful job.  When off for Christmas break, did not have headaches.  When returned to work, noticed headaches returned.  States over the last few days, the headaches have not been an issue.  No headache now.  No vision change.  Has had cataract surgery recently - bilateral.  No sinus congestion or drainage.  No chest congestion.     Past Medical History:  Diagnosis Date  . [facility])    Past Surgical History:  Procedure Laterality Date  . BACK SURGERY N/A 2004, 2005   L3, 4 and L4,5  . CATARACT EXTRACTION, BILATERAL     08/12/16 and 08/19/16.  [facility] COLONOSCOPY  02/07/2016   By dr Carlisle Beers polyps, internal hemorrhoids, waiting path report  . HERNIA REPAIR    . LAMINECTOMY     x 2  . MELANOMA EXCISION     chest area  . TONSILLECTOMY     Family History  Problem Relation Age of Onset  . Arthritis Mother   . Colon cancer Mother   . Hypertension Father   . Asthma Father   . Hypertension Brother   . Obesity Brother   . Asthma Son   . Obesity Son   . Kidney disease Neg Hx   . Prostate cancer Neg Hx    Social  History   Socioeconomic History  . Marital status: Married    Spouse name: None  . Number of children: None  . Years of education: None  . Highest education level: None  Social Needs  . Financial resource strain: None  . Food insecurity - worry: None  . Food insecurity - inability: None  . Transportation needs - medical: None  . Transportation needs - non-medical: None  Occupational History  . None  Tobacco Use  . Smoking status: Never Smoker  . Smokeless tobacco: Never Used  Substance and Sexual Activity  . Alcohol use: Yes    Alcohol/week: 0.0 oz    Comment: seldomly-3 drinks a year maybe  . Drug use: No  . Sexual activity: Yes    Birth control/protection: None  Other Topics Concern  . None  Social History Narrative   Patient states heavy exercise. 7 days per week x 30-90 minutes.    Outpatient Encounter Medications as of 07/03/2017  Medication Sig  . ibuprofen (ADVIL,MOTRIN) 800 MG tablet Take 1 tablet (800 mg total) by mouth every 8 (eight) hours as needed for moderate pain.   No facility-administered encounter medications on  file as of 07/03/2017.     Review of Systems  Constitutional: Negative for appetite change and unexpected weight change.  HENT: Negative for congestion and sinus pressure.   Respiratory: Negative for chest tightness and shortness of breath.        No significant cough.  May occasionally notice a cough.   Cardiovascular: Negative for chest pain, palpitations and leg swelling.  Gastrointestinal: Negative for abdominal pain, diarrhea, nausea and vomiting.  Genitourinary: Negative for difficulty urinating and dysuria.  Musculoskeletal: Negative for back pain and joint swelling.  Skin: Negative for color change and rash.  Neurological: Positive for headaches. Negative for dizziness and light-headedness.  Psychiatric/Behavioral: Negative for agitation and dysphoric mood.       Objective:     Blood pressure rechecked by me:  134/80  Physical  Exam  Constitutional: He appears well-developed and well-nourished. No distress.  HENT:  Nose: Nose normal.  Mouth/Throat: Oropharynx is clear and moist.  Eyes: Conjunctivae are normal. Right eye exhibits no discharge. Left eye exhibits no discharge.  Neck: Neck supple. No thyromegaly present.  Cardiovascular: Normal rate and regular rhythm.  Pulmonary/Chest: Effort normal and breath sounds normal. No respiratory distress.  Abdominal: Soft. Bowel sounds are normal. There is no tenderness.  Musculoskeletal: He exhibits no edema or tenderness.  Lymphadenopathy:    He has no cervical adenopathy.  Neurological:  No pain to palpation over the temporal region.    Skin: No rash noted. No erythema.  Psychiatric: He has a normal mood and affect. His behavior is normal.    BP 134/80   Pulse (!) 53   Temp 97.8 F (36.6 C) (Oral)   Resp 18   Wt 197 lb 12.8 oz (89.7 kg)   SpO2 99%   BMI 23.61 kg/m  Wt Readings from Last 3 Encounters:  07/03/17 197 lb 12.8 oz (89.7 kg)  05/26/17 199 lb 12.8 oz (90.6 kg)  02/23/17 195 lb 9.6 oz (88.7 kg)     Lab Results  Component Value Date   WBC 7.1 07/03/2017   HGB 14.2 07/03/2017   HCT 41.2 07/03/2017   PLT 233 07/03/2017   GLUCOSE 89 07/03/2017   CHOL 152 10/30/2016   TRIG 55 10/30/2016   HDL 66 10/30/2016   LDLCALC 75 10/30/2016   ALT 13 07/03/2017   AST 17 07/03/2017   NA 140 07/03/2017   K 4.0 07/03/2017   CL 103 07/03/2017   CREATININE 0.85 07/03/2017   BUN 30 (H) 07/03/2017   CO2 26 07/03/2017   TSH 2.39 07/03/2017       Assessment & Plan:   Problem List Items Addressed This Visit    Family history of colon cancer    Mother died of colon cancer.  He is up to date with colonoscopy. Due 01/2021.        Headache    Has noticed recently.  Discussed at length with him today. No sinus symptoms.  Appears to be sleeping ok.  No vision changes.  Has had recent eye surgery - bilateral cataract.  Pain localized over the right eye when  occurs.  No neck symptoms.  No known triggers.  Has a stressful job.  Better when out for Christmas break.  Will check esr.  Have ophthalmology evaluate.  No headache over the last few days.  No headache currently.  He will keep headache diary for possible triggers.  Discussed MRI.  Will hold at this point.  Check routine labs.  Follow closely.  Pt comfortable with this plan.       Relevant Orders   Sedimentation rate (Completed)   TSH (Completed)   Ambulatory referral to Ophthalmology   History of cataract surgery    Recent bilateral cataract surgery.  States overdue f/u.  With right side headache - above eye, will refer back to ophthalmology for further evaluation.  (saw Dr Lucita Ferrara).        Relevant Orders   Ambulatory referral to Ophthalmology   Hx of colonic polyp    Colonoscopy 02/07/16 - two diminutive polyps in the sigmoid colon and internal hemorrhoids.  (pathology hyperplastic polyps).  Recommended f/u colonoscopy in 01/2021        Vitamin D deficiency    Has a documented history of vitamin D deficiency.  Will follow.         Other Visit Diagnoses    Other fatigue       Relevant Orders   CBC with Differential/Platelet (Completed)   Comprehensive metabolic panel (Completed)   TSH (Completed)       Einar Pheasant, MD

## 2017-07-04 LAB — COMPREHENSIVE METABOLIC PANEL
AG Ratio: 1.6 (calc) (ref 1.0–2.5)
ALKALINE PHOSPHATASE (APISO): 89 U/L (ref 40–115)
ALT: 13 U/L (ref 9–46)
AST: 17 U/L (ref 10–35)
Albumin: 4.2 g/dL (ref 3.6–5.1)
BUN/Creatinine Ratio: 35 (calc) — ABNORMAL HIGH (ref 6–22)
BUN: 30 mg/dL — AB (ref 7–25)
CO2: 26 mmol/L (ref 20–32)
CREATININE: 0.85 mg/dL (ref 0.70–1.33)
Calcium: 9.3 mg/dL (ref 8.6–10.3)
Chloride: 103 mmol/L (ref 98–110)
Globulin: 2.6 g/dL (calc) (ref 1.9–3.7)
Glucose, Bld: 89 mg/dL (ref 65–99)
POTASSIUM: 4 mmol/L (ref 3.5–5.3)
Sodium: 140 mmol/L (ref 135–146)
Total Bilirubin: 0.8 mg/dL (ref 0.2–1.2)
Total Protein: 6.8 g/dL (ref 6.1–8.1)

## 2017-07-04 LAB — CBC WITH DIFFERENTIAL/PLATELET
BASOS PCT: 0.7 %
Basophils Absolute: 50 cells/uL (ref 0–200)
EOS PCT: 0.7 %
Eosinophils Absolute: 50 cells/uL (ref 15–500)
HCT: 41.2 % (ref 38.5–50.0)
Hemoglobin: 14.2 g/dL (ref 13.2–17.1)
Lymphs Abs: 1363 cells/uL (ref 850–3900)
MCH: 29.8 pg (ref 27.0–33.0)
MCHC: 34.5 g/dL (ref 32.0–36.0)
MCV: 86.4 fL (ref 80.0–100.0)
MONOS PCT: 6.7 %
MPV: 8.8 fL (ref 7.5–12.5)
Neutro Abs: 5162 cells/uL (ref 1500–7800)
Neutrophils Relative %: 72.7 %
PLATELETS: 233 10*3/uL (ref 140–400)
RBC: 4.77 10*6/uL (ref 4.20–5.80)
RDW: 11.3 % (ref 11.0–15.0)
TOTAL LYMPHOCYTE: 19.2 %
WBC mixed population: 476 cells/uL (ref 200–950)
WBC: 7.1 10*3/uL (ref 3.8–10.8)

## 2017-07-04 LAB — SEDIMENTATION RATE: SED RATE: 6 mm/h (ref 0–15)

## 2017-07-04 LAB — TSH: TSH: 2.39 m[IU]/L (ref 0.40–4.50)

## 2017-07-05 ENCOUNTER — Encounter: Payer: Self-pay | Admitting: Internal Medicine

## 2017-07-06 ENCOUNTER — Encounter: Payer: Self-pay | Admitting: Internal Medicine

## 2017-07-06 DIAGNOSIS — Z800 Family history of malignant neoplasm of digestive organs: Secondary | ICD-10-CM | POA: Insufficient documentation

## 2017-07-06 DIAGNOSIS — Z9849 Cataract extraction status, unspecified eye: Secondary | ICD-10-CM | POA: Insufficient documentation

## 2017-07-06 DIAGNOSIS — R519 Headache, unspecified: Secondary | ICD-10-CM | POA: Insufficient documentation

## 2017-07-06 DIAGNOSIS — R51 Headache: Secondary | ICD-10-CM

## 2017-07-06 NOTE — Assessment & Plan Note (Signed)
Has a documented history of vitamin D deficiency.  Will follow.

## 2017-07-06 NOTE — Assessment & Plan Note (Signed)
Colonoscopy 02/07/16 - two diminutive polyps in the sigmoid colon and internal hemorrhoids.  (pathology hyperplastic polyps).  Recommended f/u colonoscopy in 01/2021

## 2017-07-06 NOTE — Assessment & Plan Note (Signed)
Mother died of colon cancer.  He is up to date with colonoscopy. Due 01/2021.

## 2017-07-06 NOTE — Assessment & Plan Note (Signed)
Recent bilateral cataract surgery.  States overdue f/u.  With right side headache - above eye, will refer back to ophthalmology for further evaluation.  (saw Dr Lucita Ferrara).

## 2017-07-06 NOTE — Assessment & Plan Note (Signed)
Has noticed recently.  Discussed at length with him today. No sinus symptoms.  Appears to be sleeping ok.  No vision changes.  Has had recent eye surgery - bilateral cataract.  Pain localized over the right eye when occurs.  No neck symptoms.  No known triggers.  Has a stressful job.  Better when out for Christmas break.  Will check esr.  Have ophthalmology evaluate.  No headache over the last few days.  No headache currently.  He will keep headache diary for possible triggers.  Discussed MRI.  Will hold at this point.  Check routine labs.  Follow closely.  Pt comfortable with this plan.  

## 2017-08-12 DIAGNOSIS — H26492 Other secondary cataract, left eye: Secondary | ICD-10-CM | POA: Diagnosis not present

## 2017-08-21 ENCOUNTER — Ambulatory Visit: Payer: BLUE CROSS/BLUE SHIELD | Admitting: Internal Medicine

## 2017-09-18 ENCOUNTER — Ambulatory Visit: Payer: BLUE CROSS/BLUE SHIELD | Admitting: Internal Medicine

## 2017-10-22 ENCOUNTER — Other Ambulatory Visit: Payer: Self-pay | Admitting: Pediatrics

## 2017-10-22 DIAGNOSIS — N50819 Testicular pain, unspecified: Secondary | ICD-10-CM | POA: Diagnosis not present

## 2017-10-22 DIAGNOSIS — R399 Unspecified symptoms and signs involving the genitourinary system: Secondary | ICD-10-CM | POA: Diagnosis not present

## 2017-10-22 DIAGNOSIS — R1030 Lower abdominal pain, unspecified: Secondary | ICD-10-CM | POA: Diagnosis not present

## 2017-10-23 ENCOUNTER — Ambulatory Visit
Admission: RE | Admit: 2017-10-23 | Discharge: 2017-10-23 | Disposition: A | Discharge status: Home / Self Care | Payer: BLUE CROSS/BLUE SHIELD | Source: Ambulatory Visit | Attending: Pediatrics | Admitting: Pediatrics

## 2017-10-23 DIAGNOSIS — R399 Unspecified symptoms and signs involving the genitourinary system: Secondary | ICD-10-CM | POA: Diagnosis not present

## 2017-10-23 DIAGNOSIS — N433 Hydrocele, unspecified: Secondary | ICD-10-CM | POA: Insufficient documentation

## 2017-10-23 DIAGNOSIS — N50819 Testicular pain, unspecified: Secondary | ICD-10-CM | POA: Insufficient documentation

## 2017-10-23 DIAGNOSIS — N503 Cyst of epididymis: Secondary | ICD-10-CM | POA: Insufficient documentation

## 2017-10-31 ENCOUNTER — Encounter: Payer: Self-pay | Admitting: Internal Medicine

## 2017-11-04 ENCOUNTER — Telehealth: Payer: Self-pay

## 2017-11-04 ENCOUNTER — Encounter: Payer: BLUE CROSS/BLUE SHIELD | Admitting: Family Medicine

## 2017-11-04 NOTE — Telephone Encounter (Signed)
Called and left voicemail for patient to return the office call about his symptoms and sent patient a mychart message as well.

## 2017-11-06 ENCOUNTER — Ambulatory Visit (INDEPENDENT_AMBULATORY_CARE_PROVIDER_SITE_OTHER): Payer: BLUE CROSS/BLUE SHIELD | Admitting: Internal Medicine

## 2017-11-06 VITALS — BP 110/70 | HR 68 | Temp 97.7°F | Resp 18 | Ht 77.0 in | Wt 199.4 lb

## 2017-11-06 DIAGNOSIS — E559 Vitamin D deficiency, unspecified: Secondary | ICD-10-CM

## 2017-11-06 DIAGNOSIS — M79652 Pain in left thigh: Secondary | ICD-10-CM | POA: Diagnosis not present

## 2017-11-06 DIAGNOSIS — R519 Headache, unspecified: Secondary | ICD-10-CM

## 2017-11-06 DIAGNOSIS — Z0000 Encounter for general adult medical examination without abnormal findings: Secondary | ICD-10-CM | POA: Diagnosis not present

## 2017-11-06 DIAGNOSIS — Z800 Family history of malignant neoplasm of digestive organs: Secondary | ICD-10-CM | POA: Diagnosis not present

## 2017-11-06 DIAGNOSIS — R51 Headache: Secondary | ICD-10-CM | POA: Diagnosis not present

## 2017-11-06 NOTE — Progress Notes (Signed)
Patient ID: Paul Pruitt, male   DOB: 1966-03-09, 52 y.o.   MRN: [id number]   Subjective:    Patient ID: Paul Pruitt, male    DOB: [birth year], 52 y.o.   MRN: [id number]  HPI  Patient here for his physical exam.  He is a runner.  Ran a race end of March - no pain.  Continued to run and started noticing some pain in her left upper thigh.  He subsequently developed testicular pain and some reported increased urinary frequency.  Was evaluated in acute care.  Urine culture with enterococcus (10-25,000 colonies).  Was treated with levaquin.  Had ultrasound of testicles.  No acute abnormality.  Urinary symptoms and testicular pain resolved.  Still with pain in her upper thigh.  Wearing compression shorts helps.  Is aggravated by push mowing.  Has tried to cut back on some of his running, but is still running and on the ellipitical.  Also walking.  Concerned regarding persistent discomfort.  No known injury.     Past Medical History:  Diagnosis Date  . Melanoma [facility])    Past Surgical History:  Procedure Laterality Date  . BACK SURGERY N/A 2004, 2005   L3, 4 and L4,5  . CATARACT EXTRACTION, BILATERAL     08/12/16 and 08/19/16.  Marland Kitchen COLONOSCOPY  02/07/2016   By dr Carlisle Beers polyps, internal hemorrhoids, waiting path report  . HERNIA REPAIR    . LAMINECTOMY     x 2  . MELANOMA EXCISION     chest area  . TONSILLECTOMY     Family History  Problem Relation Age of Onset  . Arthritis Mother   . Colon cancer Mother   . Hypertension Father   . Asthma Father   . Hypertension Brother   . Obesity Brother   . Asthma Son   . Obesity Son   . Kidney disease Neg Hx   . Prostate cancer Neg Hx    Social History   Socioeconomic History  . Marital status: Married    Spouse name: Not on file  . Number of children: Not on file  . Years of education: Not on file  . Highest education level: Not on file  Occupational History  . Not on file  Social Needs  . Financial resource strain: Not on file  .  Food insecurity:    Worry: Not on file    Inability: Not on file  . Transportation needs:    Medical: Not on file    Non-medical: Not on file  Tobacco Use  . Smoking status: Never Smoker  . Smokeless tobacco: Never Used  Substance and Sexual Activity  . Alcohol use: Yes    Alcohol/week: 0.0 oz    Comment: seldomly-3 drinks a year maybe  . Drug use: No  . Sexual activity: Yes    Birth control/protection: None  Lifestyle  . Physical activity:    Days per week: Not on file    Minutes per session: Not on file  . Stress: Not on file  Relationships  . Social connections:    Talks on phone: Not on file    Gets together: Not on file    Attends religious service: Not on file    Active member of club or organization: Not on file    Attends meetings of clubs or organizations: Not on file    Relationship status: Not on file  Other Topics Concern  . Not on file  Social History Narrative   Patient  states heavy exercise. 7 days per week x 30-90 minutes.    Outpatient Encounter Medications as of 11/06/2017  Medication Sig  . [DISCONTINUED] ibuprofen (ADVIL,MOTRIN) 800 MG tablet Take 1 tablet (800 mg total) by mouth every 8 (eight) hours as needed for moderate pain.   No facility-administered encounter medications on file as of 11/06/2017.     Review of Systems  Constitutional: Negative for appetite change and unexpected weight change.  HENT: Negative for congestion and sinus pressure.   Eyes: Negative for pain and visual disturbance.  Respiratory: Negative for cough, chest tightness and shortness of breath.   Cardiovascular: Negative for chest pain, palpitations and leg swelling.  Gastrointestinal: Negative for abdominal pain, diarrhea, nausea and vomiting.  Genitourinary: Negative for difficulty urinating and dysuria.       No change in urinary symptoms now.  No testicular pain now.    Musculoskeletal: Negative for back pain, joint swelling and myalgias.  Skin: Negative for color  change and rash.  Neurological: Negative for dizziness, light-headedness and headaches.  Hematological: Negative for adenopathy. Does not bruise/bleed easily.  Psychiatric/Behavioral: Negative for agitation and dysphoric mood.       Objective:    Physical Exam  Constitutional: He is oriented to person, place, and time. He appears well-developed and well-nourished. No distress.  HENT:  Head: Normocephalic and atraumatic.  Nose: Nose normal.  Mouth/Throat: Oropharynx is clear and moist. No oropharyngeal exudate.  Eyes: Conjunctivae are normal. Right eye exhibits no discharge. Left eye exhibits no discharge.  Neck: Neck supple. No thyromegaly present.  Cardiovascular: Normal rate and regular rhythm.  Pulmonary/Chest: Breath sounds normal. No respiratory distress. He has no wheezes.  Abdominal: Soft. Bowel sounds are normal. There is no tenderness.  Genitourinary:  Genitourinary Comments: Rectal exam:  Enlarged prostate.  No palpable nodules.    Musculoskeletal: He exhibits no edema or tenderness.  Increased discomfort - left thigh with full extension and with abduction/adduction of lower leg.    Lymphadenopathy:    He has no cervical adenopathy.  Neurological: He is alert and oriented to person, place, and time.  Skin: No rash noted. No erythema.  Psychiatric: He has a normal mood and affect. His behavior is normal.    BP 110/70 (BP Location: Left Arm, Patient Position: Sitting, Cuff Size: Normal)   Pulse 68   Temp 97.7 F (36.5 C) (Oral)   Resp 18   Ht 6\' 5"  (1.956 m)   Wt 199 lb 6.4 oz (90.4 kg)   SpO2 97%   BMI 23.65 kg/m  Wt Readings from Last 3 Encounters:  11/06/17 199 lb 6.4 oz (90.4 kg)  07/03/17 197 lb 12.8 oz (89.7 kg)  05/26/17 199 lb 12.8 oz (90.6 kg)     Lab Results  Component Value Date   WBC 7.1 07/03/2017   HGB 14.2 07/03/2017   HCT 41.2 07/03/2017   PLT 233 07/03/2017   GLUCOSE 89 07/03/2017   CHOL 152 10/30/2016   TRIG 55 10/30/2016   HDL 66  10/30/2016   LDLCALC 75 10/30/2016   ALT 13 07/03/2017   AST 17 07/03/2017   NA 140 07/03/2017   K 4.0 07/03/2017   CL 103 07/03/2017   CREATININE 0.85 07/03/2017   BUN 30 (H) 07/03/2017   CO2 26 07/03/2017   TSH 2.39 07/03/2017    US Scrotum W/doppler  Result Date: 10/23/2017 CLINICAL DATA:  Testicular pain for several weeks on the left EXAM: SCROTAL ULTRASOUND DOPPLER ULTRASOUND OF THE TESTICLES TECHNIQUE: Complete  ultrasound examination of the testicles, epididymis, and other scrotal structures was performed. Color and spectral Doppler ultrasound were also utilized to evaluate blood flow to the testicles. COMPARISON:  None. FINDINGS: Right testicle Measurements: 5.1 x 2.9 x 4.1 cm. No mass or microlithiasis visualized. Left testicle Measurements: 5.1 x 3.1 x 3.2 cm. Few tiny calcifications are noted superiorly. Right epididymis: Epididymal cysts are noted. The largest of these measures 2.5 cm. Left epididymis:  Single cyst is noted measuring 5 mm. Hydrocele:  Small bilateral hydroceles are seen. Varicocele:  None visualized. Pulsed Doppler interrogation of both testes demonstrates normal low resistance arterial and venous waveforms bilaterally. No definitive inguinal hernia is seen. IMPRESSION: Bilateral epididymal cysts right greater than left. Bilateral hydroceles. No evidence of testicular torsion or inguinal hernia. Electronically Signed   By: Inez Catalina M.D.   On: 10/23/2017 12:19       Assessment & Plan:   Problem List Items Addressed This Visit    Family history of colon cancer    Up to date with colonoscopy.  Due 01/2021.        Headache    Not a significant issue for him now.  Doing well.  Follow.        Thigh pain    Persistent left upper thigh/leg pain as outlined.  Aggravated by running.  The previous urinary symptoms and testicular pain resolved with treatment with levaquin.  Stays active.  Has decreased some of his running.  Given persistence, will have Dr Tamala Julian  evaluate.  Avoid strained positions.        Relevant Orders   Ambulatory referral to Sports Medicine   Vitamin D deficiency    Follow vitamin  D level.            Einar Pheasant, MD

## 2017-11-09 ENCOUNTER — Encounter: Payer: Self-pay | Admitting: Internal Medicine

## 2017-11-09 DIAGNOSIS — M79659 Pain in unspecified thigh: Secondary | ICD-10-CM | POA: Insufficient documentation

## 2017-11-09 NOTE — Assessment & Plan Note (Signed)
Up to date with colonoscopy.  Due 01/2021.

## 2017-11-09 NOTE — Assessment & Plan Note (Signed)
Follow vitamin D level.  

## 2017-11-09 NOTE — Assessment & Plan Note (Signed)
Persistent left upper thigh/leg pain as outlined.  Aggravated by running.  The previous urinary symptoms and testicular pain resolved with treatment with levaquin.  Stays active.  Has decreased some of his running.  Given persistence, will have Dr Tamala Julian evaluate.  Avoid strained positions.

## 2017-11-09 NOTE — Assessment & Plan Note (Signed)
Not a significant issue for him now.  Doing well.  Follow.

## 2017-12-02 ENCOUNTER — Encounter: Payer: Self-pay | Admitting: Internal Medicine

## 2017-12-02 NOTE — Progress Notes (Signed)
[name] [address], [place] [address] Phone: [phone] Subjective:    I'm seeing this patient by the request  of:  Einar Pheasant, MD   CC: Leg pain  VOP:[id number]  Paul Pruitt is a 52 y.o. male coming in with complaint of leg pain. Patient runs and had leg pain 2 month ago in left thigh. Was diagnosed with UTI after experiencing scrotum pain on the left side. Pain has decreased with accupuncture. Did run after accupuncture and has had left hip and knee pain that developed after run. Patient wonders if he has lyme disease.   Has history of meniscus tear in right knee. Has had 2 microdiscectomies L3-4 and L4-5. Has a race next Saturday and then again 4 days later.   Patient would like to run on a more regular basis but finds it almost difficult.  States the pain only seems to start on the left side but sometimes can radiate to the pelvic bone go bilaterally.  Sometimes associated with some mild back pain.  Sometimes feels like it is radiating down the anterior thigh.  Initially hurt after working out but now hurts even at the beginning.       Past Medical History:  Diagnosis Date  . Melanoma [facility])    Past Surgical History:  Procedure Laterality Date  . BACK SURGERY N/A 2004, 2005   L3, 4 and L4,5  . CATARACT EXTRACTION, BILATERAL     08/12/16 and 08/19/16.  Marland Kitchen COLONOSCOPY  02/07/2016   By dr Carlisle Beers polyps, internal hemorrhoids, waiting path report  . HERNIA REPAIR    . LAMINECTOMY     x 2  . MELANOMA EXCISION     chest area  . TONSILLECTOMY     Social History   Socioeconomic History  . Marital status: Married    Spouse name: Not on file  . Number of children: Not on file  . Years of education: Not on file  . Highest education level: Not on file  Occupational History  . Not on file  Social Needs  . Financial resource strain: Not on file  . Food insecurity:    Worry: Not on file    Inability: Not on file  .  Transportation needs:    Medical: Not on file    Non-medical: Not on file  Tobacco Use  . Smoking status: Never Smoker  . Smokeless tobacco: Never Used  Substance and Sexual Activity  . Alcohol use: Yes    Alcohol/week: 0.0 oz    Comment: seldomly-3 drinks a year maybe  . Drug use: No  . Sexual activity: Yes    Birth control/protection: None  Lifestyle  . Physical activity:    Days per week: Not on file    Minutes per session: Not on file  . Stress: Not on file  Relationships  . Social connections:    Talks on phone: Not on file    Gets together: Not on file    Attends religious service: Not on file    Active member of club or organization: Not on file    Attends meetings of clubs or organizations: Not on file    Relationship status: Not on file  Other Topics Concern  . Not on file  Social History Narrative   Patient states heavy exercise. 7 days per week x 30-90 minutes.   No Known Allergies Family History  Problem Relation Age of Onset  . Arthritis Mother   .  Colon cancer Mother   . Hypertension Father   . Asthma Father   . Hypertension Brother   . Obesity Brother   . Asthma Son   . Obesity Son   . Kidney disease Neg Hx   . Prostate cancer Neg Hx      Past medical history, social, surgical and family history all reviewed in electronic medical record.  No pertanent information unless stated regarding to the chief complaint.   Review of Systems:Review of systems updated and as accurate as of 12/03/17  No headache, visual changes, nausea, vomiting, diarrhea, constipation, dizziness, abdominal pain, skin rash, fevers, chills, night sweats, weight loss, swollen lymph nodes,  joint swelling,  chest pain, shortness of breath, mood changes.  Mild positive muscle aches, body aches  Objective  Blood pressure 122/84, pulse (!) 59, height 6\' 5"  (1.956 m), weight 206 lb (93.4 kg), SpO2 96 %. Systems examined below as of 12/03/17   General: No apparent distress alert and  oriented x3 mood and affect normal, dressed appropriately.  HEENT: Pupils equal, extraocular movements intact  Respiratory: Patient's speak in full sentences and does not appear short of breath  Cardiovascular: No lower extremity edema, non tender, no erythema  Skin: Patient does have a bite on the left thigh that seems not infected but does have a central clearing noted. Abdomen: Soft nontender  Neuro: Cranial nerves II through XII are intact, neurovascularly intact in all extremities with 2+ DTRs and 2+ pulses.  Lymph: No lymphadenopathy of posterior or anterior cervical chain or axillae bilaterally.  Gait normal with good balance and coordination.  MSK:  Non tender with full range of motion and good stability and symmetric strength and tone of shoulders, elbows, wrist, hip, knee and ankles bilaterally.  Back Exam:  Inspection: Loss of lordosis Motion: Flexion 40 deg, Extension 20 deg, Side Bending to 35 deg bilaterally,  Rotation to 45 deg bilaterally  SLR laying: Negative  XSLR laying: Negative  Palpable tenderness: Tender to palpation the paraspinal musculature on the left side. FABER: positive left . Sensory change: Gross sensation intact to all lumbar and sacral dermatomes.  Reflexes: 2+ at both patellar tendons, 2+ at achilles tendons, Babinski's downgoing.  Strength at foot  Plantar-flexion: 5/5 Dorsi-flexion: 5/5 Eversion: 5/5 Inversion: 5/5  Leg strength  Quad: 5/5 Hamstring: 5/5 Hip flexor: 5/5 Hip abductors: 5/5  Gait unremarkable.  Patient's hip exam shows full range of motion.  Some mild discomfort over the pubic bone itself  [id number]; 15 additional minutes spent for Therapeutic exercises as stated in above notes.  This included exercises focusing on stretching, strengthening, with significant focus on eccentric aspects.   Long term goals include an improvement in range of motion, strength, endurance as well as avoiding reinjury. Patient's frequency would include in 1-2 times  a day, 3-5 times a week for a duration of 6-12 weeks. Hip strengthening exercises which included:  Pelvic tilt/bracing to help with proper recruitment of the lower abs and pelvic floor muscles  Glute strengthening to properly contract glutes without over-engaging low back and hamstrings - prone hip extension and glute bridge exercises Proper stretching techniques to increase effectiveness for the hip flexors, groin, quads, piriformic and low back when appropriate   Proper technique shown and discussed handout in great detail with ATC.  All questions were discussed and answered.      Impression and Recommendations:     This case required medical decision making of moderate complexity.      Note: This  dictation was prepared with Dragon dictation along with smaller phrase technology. Any transcriptional errors that result from this process are unintentional.

## 2017-12-03 ENCOUNTER — Other Ambulatory Visit (INDEPENDENT_AMBULATORY_CARE_PROVIDER_SITE_OTHER): Payer: BLUE CROSS/BLUE SHIELD

## 2017-12-03 ENCOUNTER — Encounter: Payer: Self-pay | Admitting: Family Medicine

## 2017-12-03 ENCOUNTER — Ambulatory Visit: Payer: BLUE CROSS/BLUE SHIELD | Admitting: Family Medicine

## 2017-12-03 VITALS — BP 122/84 | HR 59 | Ht 77.0 in | Wt 206.0 lb

## 2017-12-03 DIAGNOSIS — M869 Osteomyelitis, unspecified: Secondary | ICD-10-CM | POA: Insufficient documentation

## 2017-12-03 DIAGNOSIS — M2550 Pain in unspecified joint: Secondary | ICD-10-CM

## 2017-12-03 LAB — TESTOSTERONE: Testosterone: 308.4 ng/dL (ref 300.00–890.00)

## 2017-12-03 LAB — CBC WITH DIFFERENTIAL/PLATELET
BASOS PCT: 0.8 % (ref 0.0–3.0)
Basophils Absolute: 0 10*3/uL (ref 0.0–0.1)
EOS ABS: 0.1 10*3/uL (ref 0.0–0.7)
Eosinophils Relative: 1.4 % (ref 0.0–5.0)
HEMATOCRIT: 42.1 % (ref 39.0–52.0)
HEMOGLOBIN: 14.5 g/dL (ref 13.0–17.0)
LYMPHS PCT: 28.3 % (ref 12.0–46.0)
Lymphs Abs: 1.3 10*3/uL (ref 0.7–4.0)
MCHC: 34.6 g/dL (ref 30.0–36.0)
MCV: 88.3 fl (ref 78.0–100.0)
Monocytes Absolute: 0.4 10*3/uL (ref 0.1–1.0)
Monocytes Relative: 8.5 % (ref 3.0–12.0)
Neutro Abs: 2.7 10*3/uL (ref 1.4–7.7)
Neutrophils Relative %: 61 % (ref 43.0–77.0)
Platelets: 202 10*3/uL (ref 150.0–400.0)
RBC: 4.76 Mil/uL (ref 4.22–5.81)
RDW: 12.6 % (ref 11.5–15.5)
WBC: 4.4 10*3/uL (ref 4.0–10.5)

## 2017-12-03 LAB — SEDIMENTATION RATE: Sed Rate: 1 mm/hr (ref 0–20)

## 2017-12-03 LAB — URIC ACID: URIC ACID, SERUM: 3.5 mg/dL — AB (ref 4.0–7.8)

## 2017-12-03 LAB — IBC PANEL
Iron: 155 ug/dL (ref 42–165)
Saturation Ratios: 44.1 % (ref 20.0–50.0)
TRANSFERRIN: 251 mg/dL (ref 212.0–360.0)

## 2017-12-03 LAB — VITAMIN D 25 HYDROXY (VIT D DEFICIENCY, FRACTURES): VITD: 25.78 ng/mL — ABNORMAL LOW (ref 30.00–100.00)

## 2017-12-03 LAB — C-REACTIVE PROTEIN: CRP: 0.1 mg/dL — ABNORMAL LOW (ref 0.5–20.0)

## 2017-12-03 LAB — TSH: TSH: 3.36 u[IU]/mL (ref 0.35–4.50)

## 2017-12-03 MED ORDER — PREDNISONE 50 MG PO TABS: 50.0000 mg | ORAL_TABLET | Freq: Every day | ORAL | 0 refills | Status: DC

## 2017-12-03 MED ORDER — DOXYCYCLINE HYCLATE 100 MG PO TABS: 100.0000 mg | ORAL_TABLET | Freq: Two times a day (BID) | ORAL | 0 refills | Status: AC

## 2017-12-03 NOTE — Telephone Encounter (Signed)
Bend for patient to do labs?

## 2017-12-03 NOTE — Assessment & Plan Note (Signed)
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  Patient will do a compression sleeve, with patient having a migratory polyarthralgia I do think that possible laboratory work-up could be beneficial as well.  We discussed with patient history of vitamin D deficiency we will recheck as well and that could possibly slow down any healing at this time.  Prednisone given.  Patient concerned for the possibility of Lyme disease but I think it is highly unlikely.  Patient does have a area where it does appear to be a bug bite with a central clearing that is somewhat concerning so we will start on doxycycline.  Patient is going to try conservative therapy at this moment and over-the-counter medications and follow-up again in 3 to 5 weeks

## 2017-12-03 NOTE — Patient Instructions (Signed)
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  Labs downstairs Prednisone daily for 5 days Doxycycline 2 times a day for 3 weeks.  Over the counter get  Vitamin D 2000 IU dialy  Turmeric 500mg  daily  DHEA 50 mg daily for 4 weeks.  Thigh compression with running Try elliptical, biking and swimming for cardio only  See me again in 3-5 weeks to make sure we are improving.

## 2017-12-04 NOTE — Telephone Encounter (Signed)
Mychart message sent.

## 2017-12-07 LAB — DHEA: DHEA: 77 ng/dL — ABNORMAL LOW (ref 147–1760)

## 2017-12-07 LAB — PTH, INTACT AND CALCIUM
Calcium: 9.5 mg/dL (ref 8.6–10.3)
PTH: 43 pg/mL (ref 14–64)

## 2017-12-07 LAB — ANGIOTENSIN CONVERTING ENZYME: Angiotensin-Converting Enzyme: 30 U/L (ref 9–67)

## 2017-12-07 LAB — CALCIUM, IONIZED: CALCIUM ION: 5.3 mg/dL (ref 4.8–5.6)

## 2017-12-07 LAB — ANTI-NUCLEAR AB-TITER (ANA TITER)

## 2017-12-07 LAB — CYCLIC CITRUL PEPTIDE ANTIBODY, IGG

## 2017-12-07 LAB — ANA: Anti Nuclear Antibody(ANA): POSITIVE — AB

## 2017-12-07 LAB — RHEUMATOID FACTOR

## 2017-12-09 ENCOUNTER — Encounter: Payer: Self-pay | Admitting: Internal Medicine

## 2017-12-09 ENCOUNTER — Encounter: Payer: Self-pay | Admitting: Family Medicine

## 2017-12-09 DIAGNOSIS — R768 Other specified abnormal immunological findings in serum: Secondary | ICD-10-CM

## 2017-12-21 ENCOUNTER — Other Ambulatory Visit: Payer: Self-pay | Admitting: Family Medicine

## 2018-01-05 NOTE — Progress Notes (Signed)
[name] [address], [place] [address] Phone: [phone] Subjective:     CC: Leg pain  ION:[id number]  Paul Pruitt is a 52 y.o. male coming in with complaint of leg pain. Pain is worse than last visit. Feels fine when he takes IBU but doesn't like to take it. Left leg and testicle pain continue. Patient ran 4 miles last night and complains of roaming pain which can go into the right knee. Pain can go away throughout the day. Has been using supplements. Only 24 hours of relief with doxy and prednisone.  Patient continues to have discomfort and pain.  Patient has had a history of 2 microdiscectomy S1 and L3-L4 and 1 at L4-L5 patient at that time did have a broad-based disc herniation at L2-L3 without significant stenosis     Patient also had laboratory work-up showing a positive ANA with a 1-160 titer.  Scheduled with rheumatology in September.  Low vitamin D level also noted low DHEA.  Past Medical History:  Diagnosis Date  . Melanoma [facility])    Past Surgical History:  Procedure Laterality Date  . BACK SURGERY N/A 2004, 2005   L3, 4 and L4,5  . CATARACT EXTRACTION, BILATERAL     08/12/16 and 08/19/16.  Marland Kitchen COLONOSCOPY  02/07/2016   By dr Carlisle Beers polyps, internal hemorrhoids, waiting path report  . HERNIA REPAIR    . LAMINECTOMY     x 2  . MELANOMA EXCISION     chest area  . TONSILLECTOMY     Social History   Socioeconomic History  . Marital status: Married    Spouse name: Not on file  . Number of children: Not on file  . Years of education: Not on file  . Highest education level: Not on file  Occupational History  . Not on file  Social Needs  . Financial resource strain: Not on file  . Food insecurity:    Worry: Not on file    Inability: Not on file  . Transportation needs:    Medical: Not on file    Non-medical: Not on file  Tobacco Use  . Smoking status: Never Smoker  . Smokeless tobacco: Never Used  Substance and  Sexual Activity  . Alcohol use: Yes    Alcohol/week: 0.0 oz    Comment: seldomly-3 drinks a year maybe  . Drug use: No  . Sexual activity: Yes    Birth control/protection: None  Lifestyle  . Physical activity:    Days per week: Not on file    Minutes per session: Not on file  . Stress: Not on file  Relationships  . Social connections:    Talks on phone: Not on file    Gets together: Not on file    Attends religious service: Not on file    Active member of club or organization: Not on file    Attends meetings of clubs or organizations: Not on file    Relationship status: Not on file  Other Topics Concern  . Not on file  Social History Narrative   Patient states heavy exercise. 7 days per week x 30-90 minutes.   No Known Allergies Family History  Problem Relation Age of Onset  . Arthritis Mother   . Colon cancer Mother   . Hypertension Father   . Asthma Father   . Hypertension Brother   . Obesity Brother   . Asthma Son   . Obesity Son   .  Kidney disease Neg Hx   . Prostate cancer Neg Hx      Past medical history, social, surgical and family history all reviewed in electronic medical record.  No pertanent information unless stated regarding to the chief complaint.   Review of Systems:Review of systems updated and as accurate as of 01/06/18  No headache, visual changes, nausea, vomiting, diarrhea, constipation, dizziness, abdominal pain, skin rash, fevers, chills, night sweats, weight loss, swollen lymph nodes, , joint swelling, muscle aches, chest pain, shortness of breath, mood changes.  Positive muscle aches, body aches  Objective  Blood pressure 120/74, pulse 71, height 6\' 5"  (1.956 m), weight 205 lb (93 kg), SpO2 98 %. Systems examined below as of 01/06/18   General: No apparent distress alert and oriented x3 mood and affect normal, dressed appropriately.  HEENT: Pupils equal, extraocular movements intact  Respiratory: Patient's speak in full sentences and does  not appear short of breath  Cardiovascular: No lower extremity edema, non tender, no erythema  Skin: Warm dry intact with no signs of infection or rash on extremities or on axial skeleton.  Abdomen: Soft nontender  Neuro: Cranial nerves II through XII are intact, neurovascularly intact in all extremities with 2+ DTRs and 2+ pulses.  Lymph: No lymphadenopathy of posterior or anterior cervical chain or axillae bilaterally.  Gait normal with good balance and coordination.  MSK:  Non tender with full range of motion and good stability and symmetric strength and tone of shoulders, elbows, wrist, hip, knee and ankles bilaterally.  Back Exam:  Inspection: Mild loss of lordosis Motion: Flexion 35 deg, Extension 35 deg, Side Bending to 35 deg bilaterally,  Rotation to 35 deg bilaterally  SLR laying: Negative tightness bilaterally XSLR laying: Negative  Palpable tenderness: Tender to palpation the paraspinal musculature lumbar spine right greater than left. FABER: Significant tightness in the left. Sensory change: Gross sensation intact to all lumbar and sacral dermatomes.  Reflexes: 2+ at both patellar tendons, 2+ at achilles tendons, Babinski's downgoing.  Strength at foot  Plantar-flexion: 5/5 Dorsi-flexion: 5/5 Eversion: 5/5 Inversion: 5/5  Leg strength  Quad: 5/5 Hamstring: 5/5 Hip flexor: 5/5 Hip abductors: 4/5 but symmetric Gait unremarkable.    Impression and Recommendations:     This case required medical decision making of moderate complexity.      Note: This dictation was prepared with Dragon dictation along with smaller phrase technology. Any transcriptional errors that result from this process are unintentional.

## 2018-01-06 ENCOUNTER — Encounter: Payer: Self-pay | Admitting: Family Medicine

## 2018-01-06 ENCOUNTER — Ambulatory Visit: Payer: BLUE CROSS/BLUE SHIELD | Admitting: Family Medicine

## 2018-01-06 ENCOUNTER — Ambulatory Visit (INDEPENDENT_AMBULATORY_CARE_PROVIDER_SITE_OTHER)
Admission: RE | Admit: 2018-01-06 | Discharge: 2018-01-06 | Disposition: A | Discharge status: Home / Self Care | Payer: BLUE CROSS/BLUE SHIELD | Source: Ambulatory Visit | Attending: Family Medicine | Admitting: Family Medicine

## 2018-01-06 VITALS — BP 120/74 | HR 71 | Ht 77.0 in | Wt 205.0 lb

## 2018-01-06 DIAGNOSIS — M48061 Spinal stenosis, lumbar region without neurogenic claudication: Secondary | ICD-10-CM | POA: Diagnosis not present

## 2018-01-06 DIAGNOSIS — M545 Low back pain, unspecified: Secondary | ICD-10-CM

## 2018-01-06 DIAGNOSIS — M5416 Radiculopathy, lumbar region: Secondary | ICD-10-CM | POA: Insufficient documentation

## 2018-01-06 MED ORDER — GABAPENTIN 100 MG PO CAPS
200.0000 mg | ORAL_CAPSULE | Freq: Every day | ORAL | 3 refills | Status: DC
Start: 2018-01-06 — End: 2018-02-04

## 2018-01-06 NOTE — Patient Instructions (Addendum)
Good to see you  Sorry not better yet.  Lets get xray today  Ice 20 minutes 2 times daily. Usually after activity and before bed. Keep being active.  COntinue the vitamin D and the DHEA  Gabapentin 200mg  at night Ibuprofen 800mg  3 times a day for 3 days then as needed See me again in 3 weeks

## 2018-01-06 NOTE — Assessment & Plan Note (Signed)
Patient is having signs and symptoms now more consistent with a lumbar radiculopathy than the osteitis pubis.  Did not get a significant improvement.  Did have some improvement with the prednisone that does make me feel that some of it could be inflammatory.  History of 2 microdiscectomies and a laminotomy noted of the lower back and we could be having more than adjacent segment disease.  CT scan did not show a protruding disc at that level many years ago.  X-rays ordered today.  Started gabapentin, discussed icing regimen, continues to be active.  Follow-up again in 4 weeks

## 2018-01-07 ENCOUNTER — Other Ambulatory Visit: Payer: Self-pay

## 2018-01-07 MED ORDER — IBUPROFEN-FAMOTIDINE 800-26.6 MG PO TABS: 800.0000 mg | ORAL_TABLET | Freq: Three times a day (TID) | ORAL | 3 refills | Status: DC

## 2018-01-21 DIAGNOSIS — L239 Allergic contact dermatitis, unspecified cause: Secondary | ICD-10-CM | POA: Diagnosis not present

## 2018-02-01 NOTE — Progress Notes (Signed)
[name] [address], [place] [address] Phone: [phone] Subjective:      CC: Back pain follow-up  SEG:[id number]  Paul Pruitt is a 52 y.o. male coming in with complaint of back pain. He has felt some improvement since last visit. He did not take gabapentin as prescribed. Used duexis which decreased his pain. Has been running. Ran today for 5 miles.  Patient overall states that he is feeling 90% better.      Past Medical History:  Diagnosis Date  . Melanoma [facility])    Past Surgical History:  Procedure Laterality Date  . BACK SURGERY N/A 2004, 2005   L3, 4 and L4,5  . CATARACT EXTRACTION, BILATERAL     08/12/16 and 08/19/16.  Marland Kitchen COLONOSCOPY  02/07/2016   By dr Carlisle Beers polyps, internal hemorrhoids, waiting path report  . HERNIA REPAIR    . LAMINECTOMY     x 2  . MELANOMA EXCISION     chest area  . TONSILLECTOMY     Social History   Socioeconomic History  . Marital status: Married    Spouse name: Not on file  . Number of children: Not on file  . Years of education: Not on file  . Highest education level: Not on file  Occupational History  . Not on file  Social Needs  . Financial resource strain: Not on file  . Food insecurity:    Worry: Not on file    Inability: Not on file  . Transportation needs:    Medical: Not on file    Non-medical: Not on file  Tobacco Use  . Smoking status: Never Smoker  . Smokeless tobacco: Never Used  Substance and Sexual Activity  . Alcohol use: Yes    Alcohol/week: 0.0 standard drinks    Comment: seldomly-3 drinks a year maybe  . Drug use: No  . Sexual activity: Yes    Birth control/protection: None  Lifestyle  . Physical activity:    Days per week: Not on file    Minutes per session: Not on file  . Stress: Not on file  Relationships  . Social connections:    Talks on phone: Not on file    Gets together: Not on file    Attends religious service: Not on file    Active member of  club or organization: Not on file    Attends meetings of clubs or organizations: Not on file    Relationship status: Not on file  Other Topics Concern  . Not on file  Social History Narrative   Patient states heavy exercise. 7 days per week x 30-90 minutes.   No Known Allergies Family History  Problem Relation Age of Onset  . Arthritis Mother   . Colon cancer Mother   . Hypertension Father   . Asthma Father   . Hypertension Brother   . Obesity Brother   . Asthma Son   . Obesity Son   . Kidney disease Neg Hx   . Prostate cancer Neg Hx      Past medical history, social, surgical and family history all reviewed in electronic medical record.  No pertanent information unless stated regarding to the chief complaint.   Review of Systems:Review of systems updated and as accurate as of 02/04/18  No headache, visual changes, nausea, vomiting, diarrhea, constipation, dizziness, abdominal pain, skin rash, fevers, chills, night sweats, weight loss, swollen lymph nodes, body aches, joint swelling,, chest pain, shortness of breath,  mood changes.  Mild positive muscle aches  Objective  Blood pressure 110/80, pulse 72, height 6\' 5"  (1.956 m), weight 205 lb (93 kg), SpO2 98 %. Systems examined below as of 02/04/18   General: No apparent distress alert and oriented x3 mood and affect normal, dressed appropriately.  HEENT: Pupils equal, extraocular movements intact  Respiratory: Patient's speak in full sentences and does not appear short of breath  Cardiovascular: No lower extremity edema, non tender, no erythema  Skin: Warm dry intact with no signs of infection or rash on extremities or on axial skeleton.  Abdomen: Soft nontender  Neuro: Cranial nerves II through XII are intact, neurovascularly intact in all extremities with 2+ DTRs and 2+ pulses.  Lymph: No lymphadenopathy of posterior or anterior cervical chain or axillae bilaterally.  Gait normal with good balance and coordination.  MSK:   Non tender with full range of motion and good stability and symmetric strength and tone of shoulders, elbows, wrist, hip, knee and ankles bilaterally.  Back Exam:  Inspection: Unremarkable  Motion: Flexion 45 deg, Extension 25 deg, Side Bending to 45 deg bilaterally,  Rotation to 45 deg bilaterally  SLR laying: Negative  XSLR laying: Negative  Palpable tenderness: Mild discomfort in the paraspinal musculature lumbar spine on the left side. FABER: negative. Sensory change: Gross sensation intact to all lumbar and sacral dermatomes.  Reflexes: 2+ at both patellar tendons, 2+ at achilles tendons, Babinski's downgoing.  Strength at foot  Plantar-flexion: 5/5 Dorsi-flexion: 5/5 Eversion: 5/5 Inversion: 5/5  Leg strength  Quad: 5/5 Hamstring: 5/5 Hip flexor: 5/5 Hip abductors: 5/5  Gait unremarkable.   Impression and Recommendations:     This case required medical decision making of moderate complexity.      Note: This dictation was prepared with Dragon dictation along with smaller phrase technology. Any transcriptional errors that result from this process are unintentional.

## 2018-02-04 ENCOUNTER — Ambulatory Visit: Payer: BLUE CROSS/BLUE SHIELD | Admitting: Family Medicine

## 2018-02-04 ENCOUNTER — Encounter: Payer: Self-pay | Admitting: Family Medicine

## 2018-02-04 DIAGNOSIS — M5416 Radiculopathy, lumbar region: Secondary | ICD-10-CM | POA: Diagnosis not present

## 2018-02-04 NOTE — Patient Instructions (Signed)
Good to see you  Try Duexis 3 times a day for 3 days instead of daily and see how you do  If doing well then try to cut back on the turmeric  Ice after running and make sure eating You can read about effexor or cymbalta as well to see if these are options for the nerve pain  See me again in 6 weeks but send a message in 3 weeks so I know how you are doing

## 2018-02-04 NOTE — Assessment & Plan Note (Signed)
Improving overall.  Patient is concerned due to the amount of anti-inflammatories he is taking on a regular basis.  We discussed with patient in great length about potentially doing more of a burst.  Discussed about discontinuing other medications that he is been taking over-the-counter.  Patient has been increasing activity and is feeling relatively well though.  Follow-up with me again in 3 to 4 weeks

## 2018-03-18 ENCOUNTER — Ambulatory Visit: Payer: BLUE CROSS/BLUE SHIELD | Admitting: Family Medicine

## 2018-05-24 ENCOUNTER — Encounter: Payer: Self-pay | Admitting: Internal Medicine

## 2018-05-24 ENCOUNTER — Ambulatory Visit (INDEPENDENT_AMBULATORY_CARE_PROVIDER_SITE_OTHER): Payer: BLUE CROSS/BLUE SHIELD | Admitting: Internal Medicine

## 2018-05-24 DIAGNOSIS — Z800 Family history of malignant neoplasm of digestive organs: Secondary | ICD-10-CM

## 2018-05-24 DIAGNOSIS — M5416 Radiculopathy, lumbar region: Secondary | ICD-10-CM

## 2018-05-24 DIAGNOSIS — E559 Vitamin D deficiency, unspecified: Secondary | ICD-10-CM | POA: Diagnosis not present

## 2018-05-24 DIAGNOSIS — Z23 Encounter for immunization: Secondary | ICD-10-CM | POA: Diagnosis not present

## 2018-05-24 NOTE — Progress Notes (Signed)
Patient ID: Paul Pruitt, male   DOB: [birth year], 51 y.o.   MRN: [id number]   Subjective:    Patient ID: Paul Pruitt, male    DOB: 09-Nov-1965, 52 y.o.   MRN: [id number]  HPI  Patient here for a scheduled follow up.  Was being followed by Dr Tamala Julian for back pain.  Notes reviewed.  Was on duexis, which worked well for him.  Off now.  Does not want to take an increased amount of antiinflammatories.  Still with discomfort.  Is running.  Not affecting his activity.  Desires no further intervention.  Is very active.  No chest pain or sob with increased activity or exertion.  No acid reflux.  No abdominal pain.  Bowels moving.  Has noticed over the last two weeks (around 4:00 pm) his ears will get red and feel hot.  No fever. No headache, light headedness.  No sinus congestion.  No ear ache.  He relates it to being tired.  Will last about 20-30 minutes and resolves on its on.  Does not feel bad.  No other symptoms. Overall he feels he is doing well.     Past Medical History:  Diagnosis Date  . Melanoma [facility])    Past Surgical History:  Procedure Laterality Date  . BACK SURGERY N/A 2004, 2005   L3, 4 and L4,5  . CATARACT EXTRACTION, BILATERAL     08/12/16 and 08/19/16.  Marland Kitchen COLONOSCOPY  02/07/2016   By dr Carlisle Beers polyps, internal hemorrhoids, waiting path report  . HERNIA REPAIR    . LAMINECTOMY     x 2  . MELANOMA EXCISION     chest area  . TONSILLECTOMY     Family History  Problem Relation Age of Onset  . Arthritis Mother   . Colon cancer Mother   . Hypertension Father   . Asthma Father   . Hypertension Brother   . Obesity Brother   . Asthma Son   . Obesity Son   . Kidney disease Neg Hx   . Prostate cancer Neg Hx    Social History   Socioeconomic History  . Marital status: Married    Spouse name: Not on file  . Number of children: Not on file  . Years of education: Not on file  . Highest education level: Not on file  Occupational History  . Not on file  Social Needs  .  Financial resource strain: Not on file  . Food insecurity:    Worry: Not on file    Inability: Not on file  . Transportation needs:    Medical: Not on file    Non-medical: Not on file  Tobacco Use  . Smoking status: Never Smoker  . Smokeless tobacco: Never Used  Substance and Sexual Activity  . Alcohol use: Yes    Alcohol/week: 0.0 standard drinks    Comment: seldomly-3 drinks a year maybe  . Drug use: No  . Sexual activity: Yes    Birth control/protection: None  Lifestyle  . Physical activity:    Days per week: Not on file    Minutes per session: Not on file  . Stress: Not on file  Relationships  . Social connections:    Talks on phone: Not on file    Gets together: Not on file    Attends religious service: Not on file    Active member of club or organization: Not on file    Attends meetings of clubs or organizations: Not on file  Relationship status: Not on file  Other Topics Concern  . Not on file  Social History Narrative   Patient states heavy exercise. 7 days per week x 30-90 minutes.    Outpatient Encounter Medications as of 05/24/2018  Medication Sig  . [DISCONTINUED] Ibuprofen-Famotidine (DUEXIS) 800-26.6 MG TABS Take 800 mg by mouth 3 (three) times daily.  . [DISCONTINUED] predniSONE (DELTASONE) 50 MG tablet Take 1 tablet (50 mg total) by mouth daily.   No facility-administered encounter medications on file as of 05/24/2018.     Review of Systems  Constitutional: Negative for appetite change and unexpected weight change.  HENT: Negative for congestion and sinus pressure.   Respiratory: Negative for cough, chest tightness and shortness of breath.   Cardiovascular: Negative for chest pain, palpitations and leg swelling.  Gastrointestinal: Negative for abdominal pain, diarrhea, nausea and vomiting.  Genitourinary: Negative for difficulty urinating and dysuria.  Musculoskeletal: Negative for joint swelling and myalgias.       Persistent back pain.   Skin:  Negative for color change and rash.  Neurological: Negative for dizziness, light-headedness and headaches.  Psychiatric/Behavioral: Negative for agitation and dysphoric mood.       Objective:     Blood pressure rechecked by me:  116/72  Physical Exam  Constitutional: He appears well-developed and well-nourished. No distress.  HENT:  Nose: Nose normal.  Mouth/Throat: Oropharynx is clear and moist.  Ears - no redness.  TMs without erythema.  Canals clear.   Neck: Neck supple. No thyromegaly present.  Cardiovascular: Normal rate and regular rhythm.  Pulmonary/Chest: Effort normal and breath sounds normal. No respiratory distress.  Abdominal: Soft. Bowel sounds are normal. There is no tenderness.  Musculoskeletal: He exhibits no edema or tenderness.  Lymphadenopathy:    He has no cervical adenopathy.  Skin: No rash noted. No erythema.  Psychiatric: He has a normal mood and affect. His behavior is normal.    BP 120/72 (BP Location: Left Arm, Patient Position: Sitting, Cuff Size: Normal)   Pulse (!) 54   Temp 97.6 F (36.4 C) (Oral)   Resp 16   Wt 205 lb (93 kg)   SpO2 99%   BMI 24.31 kg/m  Wt Readings from Last 3 Encounters:  05/24/18 205 lb (93 kg)  02/04/18 205 lb (93 kg)  01/06/18 205 lb (93 kg)     Lab Results  Component Value Date   WBC 4.4 12/03/2017   HGB 14.5 12/03/2017   HCT 42.1 12/03/2017   PLT 202.0 12/03/2017   GLUCOSE 89 07/03/2017   CHOL 152 10/30/2016   TRIG 55 10/30/2016   HDL 66 10/30/2016   LDLCALC 75 10/30/2016   ALT 13 07/03/2017   AST 17 07/03/2017   NA 140 07/03/2017   K 4.0 07/03/2017   CL 103 07/03/2017   CREATININE 0.85 07/03/2017   BUN 30 (H) 07/03/2017   CO2 26 07/03/2017   TSH 3.36 12/03/2017    Dg Lumbar Spine 2-3 Views  Result Date: 01/06/2018 CLINICAL DATA:  Left leg pain, initial encounter EXAM: LUMBAR SPINE - 2-3 VIEW COMPARISON:  12/11/2015 plain film, MRI from 02/13/2016 FINDINGS: Five lumbar type vertebral bodies are  well visualized. Postsurgical changes are noted on the right from L3-S1. vertebral body height is well maintained. No anterolisthesis is noted. Disc space narrowing is noted at L4-5 and L5-S1. No soft tissue abnormality is seen. Numbering nomenclature is similar to that utilized on prior MRI from 02/13/2016. IMPRESSION: Postsurgical and mild degenerative changes. Electronically Signed  By: Inez Catalina M.D.   On: 01/06/2018 09:04       Assessment & Plan:   Problem List Items Addressed This Visit    Chronic left lumbar radiculopathy    Still with some persistent pain.  Not taking anything now.  Is running.  Desires no further intervention.        Family history of colon cancer    Up to date.  Due colonoscopy 01/2021.       Vitamin D deficiency    Follow vitamin D level.         Other Visit Diagnoses    Need for immunization against influenza       Relevant Orders   Flu Vaccine QUAD 36+ mos IM (Completed)     Ear redness:  Unclear etiology.  No abnormality noted on exam.  Feels good.  No pain.  Wants to monitor.     Einar Pheasant, MD

## 2018-05-25 ENCOUNTER — Encounter: Payer: Self-pay | Admitting: Internal Medicine

## 2018-05-25 NOTE — Assessment & Plan Note (Signed)
Follow vitamin D level.  

## 2018-05-25 NOTE — Assessment & Plan Note (Signed)
Still with some persistent pain.  Not taking anything now.  Is running.  Desires no further intervention.

## 2018-05-25 NOTE — Assessment & Plan Note (Signed)
Up to date.  Due colonoscopy 01/2021.

## 2018-06-02 ENCOUNTER — Other Ambulatory Visit: Payer: BLUE CROSS/BLUE SHIELD | Admitting: Orthotics

## 2018-06-02 ENCOUNTER — Ambulatory Visit: Payer: Self-pay | Admitting: Podiatry

## 2018-06-03 ENCOUNTER — Encounter: Payer: Self-pay | Admitting: Podiatry

## 2018-06-03 ENCOUNTER — Ambulatory Visit: Payer: BLUE CROSS/BLUE SHIELD

## 2018-06-03 ENCOUNTER — Ambulatory Visit: Payer: BLUE CROSS/BLUE SHIELD | Admitting: Podiatry

## 2018-06-03 DIAGNOSIS — M7750 Other enthesopathy of unspecified foot: Secondary | ICD-10-CM

## 2018-06-03 DIAGNOSIS — M722 Plantar fascial fibromatosis: Secondary | ICD-10-CM

## 2018-06-05 NOTE — Progress Notes (Signed)
  Subjective:  Patient ID: Paul Pruitt, male    DOB: [birth year],  MRN: [id number] HPI Chief Complaint  Patient presents with  . Foot Orthotics    Requesting new orthotics-initially treated for plantar fasciitis, currently having zero pain, active runner  . New Patient (Initial Visit)    Est pt 50    52 y.o. male presents with the above complaint.   ROS: Denies fever chills nausea vomiting muscle aches pains headache calf pain back pain shortness of breath.  Past Medical History:  Diagnosis Date  . Melanoma [facility])    Past Surgical History:  Procedure Laterality Date  . BACK SURGERY N/A 2004, 2005   L3, 4 and L4,5  . CATARACT EXTRACTION, BILATERAL     08/12/16 and 08/19/16.  Marland Kitchen COLONOSCOPY  02/07/2016   By dr Carlisle Beers polyps, internal hemorrhoids, waiting path report  . HERNIA REPAIR    . LAMINECTOMY     x 2  . MELANOMA EXCISION     chest area  . TONSILLECTOMY     No current outpatient medications on file.  No Known Allergies Review of Systems Objective:  There were no vitals filed for this visit.  General: Well developed, nourished, in no acute distress, alert and oriented x3   Dermatological: Skin is warm, dry and supple bilateral. Nails x 10 are well maintained; remaining integument appears unremarkable at this time. There are no open sores, no preulcerative lesions, no rash or signs of infection present.  Vascular: Dorsalis Pedis artery and Posterior Tibial artery pedal pulses are 2/4 bilateral with immedate capillary fill time. Pedal hair growth present. No varicosities and no lower extremity edema present bilateral.   Neruologic: Grossly intact via light touch bilateral. Vibratory intact via tuning fork bilateral. Protective threshold with Semmes Wienstein monofilament intact to all pedal sites bilateral. Patellar and Achilles deep tendon reflexes 2+ bilateral. No Babinski or clonus noted bilateral.   Musculoskeletal: No gross boney pedal deformities bilateral. No  pain, crepitus, or limitation noted with foot and ankle range of motion bilateral. Muscular strength 5/5 in all groups tested bilateral.  History of plantar fasciitis and plantar fibromatosis  Gait: Unassisted, Nonantalgic.    Radiographs:  No acute findings  Assessment & Plan:   Assessment: History of plantar fasciitis plantar fibromatosis  Plan: New orthotics     [place], [place]

## 2018-06-25 ENCOUNTER — Encounter: Payer: Self-pay | Admitting: Family Medicine

## 2018-06-25 ENCOUNTER — Ambulatory Visit: Payer: BLUE CROSS/BLUE SHIELD | Admitting: Family Medicine

## 2018-06-25 VITALS — BP 148/80 | HR 69 | Temp 98.3°F | Resp 16 | Ht 76.0 in | Wt 209.0 lb

## 2018-06-25 DIAGNOSIS — M5442 Lumbago with sciatica, left side: Secondary | ICD-10-CM

## 2018-06-25 DIAGNOSIS — M62838 Other muscle spasm: Secondary | ICD-10-CM

## 2018-06-25 MED ORDER — PREDNISONE 10 MG (21) PO TBPK: ORAL_TABLET | ORAL | 0 refills | Status: DC

## 2018-06-25 MED ORDER — KETOROLAC TROMETHAMINE 60 MG/2ML IM SOLN
60.0000 mg | Freq: Once | INTRAMUSCULAR | Status: AC
  Administered 2018-06-25: 60 mg via INTRAMUSCULAR

## 2018-06-25 MED ORDER — DIAZEPAM 2 MG PO TABS: ORAL_TABLET | ORAL | 0 refills | Status: DC

## 2018-06-25 MED ORDER — METHYLPREDNISOLONE ACETATE 40 MG/ML IJ SUSP
40.0000 mg | Freq: Once | INTRAMUSCULAR | Status: AC
  Administered 2018-06-25: 40 mg via INTRAMUSCULAR

## 2018-06-25 NOTE — Progress Notes (Signed)
Subjective:    Patient ID: Paul Pruitt, male    DOB: [birth year], 53 y.o.   MRN: [id number]  HPI   Patient presents to clinic complaining of left-sided low back pain with pain radiating into her left leg that began about a week ago after around.  Patient is a athletic person, runs a few miles almost every day.  He is training for a half marathon, and has been trying to push himself by running at least 4 to 8 miles a day depending on his round schedule.  Patient does try to stretch his back, but wonders if he tweaked something on this front that has set off the pain.  Patient reports a history of back problems since his 79s.  He has had laminectomy x2.  Patient was seeing sports medicine, Dr. Tamala Julian back in summer 2019.  Patient states his visits will have him more successful.  Patient did use Duexis as prescribed by Dr. Tamala Julian, this medication did help his pain to improve.  Patient currently is not taking anything for pain other than an occasional ibuprofen if needed.  Patient has been prescribed gabapentin by Dr. Tamala Julian, but chose not to take this.  States since hurting his back 1 week ago, he has tried a couple doses of 800 mg of ibuprofen without much relief.  He is also been trying to do stretching, using a heating pad and ice -however pain continues to persist.  Denies any numbness in saddle area, denies any loss of bowel or bladder control.  Patient Active Problem List   Diagnosis Date Noted  . Chronic left lumbar radiculopathy 01/06/2018  . 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 Thigh pain 11/09/2017  . Headache 07/06/2017  . History of cataract surgery 07/06/2017  . Family history of colon cancer 07/06/2017  . BPH with obstruction/lower urinary tract symptoms 12/21/2014  . Vitamin D deficiency 11/21/2014  . Hx of colonic polyp 11/21/2014  . Encounter for screening for malignant neoplasm of prostate 10/17/2013  . Acid reflux 02/15/2002   Social History   Tobacco Use  . Smoking  status: Never Smoker  . Smokeless tobacco: Never Used  Substance Use Topics  . Alcohol use: Yes    Alcohol/week: 0.0 standard drinks    Comment: seldomly-3 drinks a year maybe    Review of Systems  Constitutional: Negative for chills, fatigue and fever.  HENT: Negative for congestion, ear pain, sinus pain and sore throat.   Eyes: Negative.   Respiratory: Negative for cough, shortness of breath and wheezing.   Cardiovascular: Negative for chest pain, palpitations and leg swelling.  Gastrointestinal: Negative for abdominal pain, diarrhea, nausea and vomiting.  Genitourinary: Negative for dysuria, frequency and urgency.  Musculoskeletal: +low back and left leg pain  Skin: Negative for color change, pallor and rash.  Neurological: Negative for syncope, light-headedness and headaches.  Psychiatric/Behavioral: The patient is not nervous/anxious.       Objective:   Physical Exam Vitals signs and nursing note reviewed.  Constitutional:      General: He is not in acute distress.    Appearance: Normal appearance. He is not toxic-appearing.  HENT:     Head: Normocephalic and atraumatic.  Eyes:     General: No scleral icterus.    Extraocular Movements: Extraocular movements intact.     Pupils: Pupils are equal, round, and reactive to light.  Cardiovascular:     Rate and Rhythm: Normal rate and regular rhythm.  Pulmonary:     Effort: Pulmonary  effort is normal. No respiratory distress.     Breath sounds: Normal breath sounds.  Abdominal:     General: Abdomen is flat.     Palpations: Abdomen is soft.  Musculoskeletal:       Back:     Right lower leg: No edema.     Left lower leg: No edema.     Comments: +pain on left low back down into left buttocks. Straight leg raise of left leg causing pulling pain in left side of back.   Skin:    General: Skin is warm and dry.     Coloration: Skin is not pale.     Findings: No erythema.  Neurological:     Mental Status: He is alert and  oriented to person, place, and time.     Cranial Nerves: No cranial nerve deficit.     Motor: No weakness.     Coordination: Coordination normal.     Comments: Takes a few steps to get stride going, but then walks normally. When turning direction, does cautiously to avoid catch in back.  Quadricep strength equal and strong. Grips equal and strong. Can raise eyebrows, puff out cheeks and clench teeth with no issue.   Psychiatric:        Mood and Affect: Mood normal.        Behavior: Behavior normal.    Vitals:   06/25/18 1623  BP: (!) 148/80  Pulse: 69  Resp: 16  Temp: 98.3 F (36.8 C)  SpO2: 97%      Assessment & Plan:    Acute left-sided low back pain with sciatica, muscle spasm - patient has flight tomorrow to [place], would like something in office today to help relieve pain more quickly if possible.  We will give IM Toradol and IM methylprednisolone x1.  Patient will then take steroid taper and use Valium very low dose if needed for muscle spasm.  Patient is aware that Valium can cause drowsiness, so he should not take this prior to driving.  Patient also advised he can take ibuprofen if needed, but he must eat food prior to taking this.  Encouraged to avoid taking too much ibuprofen while on oral steroid course as the combination of the 2 oral medicines together can cause stomach upset.  Patient given handout outlining back stretching exercises to help improve back pain.  Patient also encouraged to take a break from running long distances for at least a few days, advised he can do things like walking on the elliptical or lifting small weights to keep himself physically active.  Running is very tough on the joints, does pound on knees hips and low back, so taking a break from this should be beneficial.  Patient also advised to not only stretches low back but also stretch his legs and arms, neck all in preparation before doing any sort of physical activity to help stretch muscles and get  muscles ready for exercise. I will put an MRI order due to patient having continued issues with his low back.  Last x-ray of lumbar spine was done in the summer 2019.  Administrations This Visit    ketorolac (TORADOL) injection 60 mg    Admin Date 06/25/2018 Action Given Dose 60 mg Route Intramuscular Administered By Neta Ehlers, RMA       methylPREDNISolone acetate (DEPO-MEDROL) injection 40 mg    Admin Date 06/25/2018 Action Given Dose 40 mg Route Intramuscular Administered By Neta Ehlers, RMA  Patient is aware someone will contact him in regards to his MRI appointment.  He will keep regularly scheduled follow-up with PCP as planned.  He will let us know if he is not continuing to improve.

## 2018-06-25 NOTE — Patient Instructions (Addendum)
Valium (diazepam) can cause drowsiness  When taking ibuprofen, be sure to eat something with this to coat stomach   Back Exercises If you have pain in your back, do these exercises 2-3 times each day or as told by your doctor. When the pain goes away, do the exercises once each day, but repeat the steps more times for each exercise (do more repetitions). If you do not have pain in your back, do these exercises once each day or as told by your doctor. Exercises Single Knee to Chest Do these steps 3-5 times in a row for each leg: 1. Lie on your back on a firm bed or the floor with your legs stretched out. 2. Bring one knee to your chest. 3. Hold your knee to your chest by grabbing your knee or thigh. 4. Pull on your knee until you feel a gentle stretch in your lower back. 5. Keep doing the stretch for 10-30 seconds. 6. Slowly let go of your leg and straighten it. Pelvic Tilt Do these steps 5-10 times in a row: 1. Lie on your back on a firm bed or the floor with your legs stretched out. 2. Bend your knees so they point up to the ceiling. Your feet should be flat on the floor. 3. Tighten your lower belly (abdomen) muscles to press your lower back against the floor. This will make your tailbone point up to the ceiling instead of pointing down to your feet or the floor. 4. Stay in this position for 5-10 seconds while you gently tighten your muscles and breathe evenly. Cat-Cow Do these steps until your lower back bends more easily: 1. Get on your hands and knees on a firm surface. Keep your hands under your shoulders, and keep your knees under your hips. You may put padding under your knees. 2. Let your head hang down, and make your tailbone point down to the floor so your lower back is round like the back of a cat. 3. Stay in this position for 5 seconds. 4. Slowly lift your head and make your tailbone point up to the ceiling so your back hangs low (sags) like the back of a cow. 5. Stay in this  position for 5 seconds.  Press-Ups Do these steps 5-10 times in a row: 1. Lie on your belly (face-down) on the floor. 2. Place your hands near your head, about shoulder-width apart. 3. While you keep your back relaxed and keep your hips on the floor, slowly straighten your arms to raise the top half of your body and lift your shoulders. Do not use your back muscles. To make yourself more comfortable, you may change where you place your hands. 4. Stay in this position for 5 seconds. 5. Slowly return to lying flat on the floor.  Bridges Do these steps 10 times in a row: 1. Lie on your back on a firm surface. 2. Bend your knees so they point up to the ceiling. Your feet should be flat on the floor. 3. Tighten your butt muscles and lift your butt off of the floor until your waist is almost as high as your knees. If you do not feel the muscles working in your butt and the back of your thighs, slide your feet 1-2 inches farther away from your butt. 4. Stay in this position for 3-5 seconds. 5. Slowly lower your butt to the floor, and let your butt muscles relax. If this exercise is too easy, try doing it with your arms  crossed over your chest. Belly Crunches Do these steps 5-10 times in a row: 1. Lie on your back on a firm bed or the floor with your legs stretched out. 2. Bend your knees so they point up to the ceiling. Your feet should be flat on the floor. 3. Cross your arms over your chest. 4. Tip your chin a little bit toward your chest but do not bend your neck. 5. Tighten your belly muscles and slowly raise your chest just enough to lift your shoulder blades a tiny bit off of the floor. 6. Slowly lower your chest and your head to the floor. Back Lifts Do these steps 5-10 times in a row: 1. Lie on your belly (face-down) with your arms at your sides, and rest your forehead on the floor. 2. Tighten the muscles in your legs and your butt. 3. Slowly lift your chest off of the floor while you  keep your hips on the floor. Keep the back of your head in line with the curve in your back. Look at the floor while you do this. 4. Stay in this position for 3-5 seconds. 5. Slowly lower your chest and your face to the floor. Contact a doctor if:  Your back pain gets a lot worse when you do an exercise.  Your back pain does not lessen 2 hours after you exercise. If you have any of these problems, stop doing the exercises. Do not do them again unless your doctor says it is okay. Get help right away if:  You have sudden, very bad back pain. If this happens, stop doing the exercises. Do not do them again unless your doctor says it is okay. This information is not intended to replace advice given to you by your health care provider. Make sure you discuss any questions you have with your health care provider. Document Released: 07/05/2010 Document Revised: 02/24/2018 Document Reviewed: 07/27/2014 Elsevier Interactive Patient Education  [employer].

## 2018-06-29 ENCOUNTER — Ambulatory Visit: Payer: BLUE CROSS/BLUE SHIELD | Admitting: Internal Medicine

## 2018-07-02 ENCOUNTER — Emergency Department: Payer: BLUE CROSS/BLUE SHIELD

## 2018-07-02 ENCOUNTER — Emergency Department
Admission: EM | Admit: 2018-07-02 | Discharge: 2018-07-03 | Disposition: A | Discharge status: Home / Self Care | Payer: BLUE CROSS/BLUE SHIELD | Attending: Emergency Medicine | Admitting: Emergency Medicine

## 2018-07-02 ENCOUNTER — Other Ambulatory Visit: Payer: Self-pay

## 2018-07-02 DIAGNOSIS — R3911 Hesitancy of micturition: Secondary | ICD-10-CM | POA: Insufficient documentation

## 2018-07-02 DIAGNOSIS — M545 Low back pain: Secondary | ICD-10-CM | POA: Diagnosis present

## 2018-07-02 DIAGNOSIS — N401 Enlarged prostate with lower urinary tract symptoms: Secondary | ICD-10-CM | POA: Insufficient documentation

## 2018-07-02 DIAGNOSIS — M25559 Pain in unspecified hip: Secondary | ICD-10-CM | POA: Diagnosis not present

## 2018-07-02 DIAGNOSIS — Z79899 Other long term (current) drug therapy: Secondary | ICD-10-CM | POA: Insufficient documentation

## 2018-07-02 DIAGNOSIS — M5416 Radiculopathy, lumbar region: Secondary | ICD-10-CM | POA: Insufficient documentation

## 2018-07-02 LAB — CBC WITH DIFFERENTIAL/PLATELET
Abs Immature Granulocytes: 0.05 10*3/uL (ref 0.00–0.07)
BASOS ABS: 0 10*3/uL (ref 0.0–0.1)
Basophils Relative: 0 %
Eosinophils Absolute: 0.1 10*3/uL (ref 0.0–0.5)
Eosinophils Relative: 1 %
HCT: 43.5 % (ref 39.0–52.0)
Hemoglobin: 14.8 g/dL (ref 13.0–17.0)
IMMATURE GRANULOCYTES: 1 %
Lymphocytes Relative: 22 %
Lymphs Abs: 1.9 10*3/uL (ref 0.7–4.0)
MCH: 29.5 pg (ref 26.0–34.0)
MCHC: 34 g/dL (ref 30.0–36.0)
MCV: 86.7 fL (ref 80.0–100.0)
Monocytes Absolute: 0.8 10*3/uL (ref 0.1–1.0)
Monocytes Relative: 10 %
NEUTROS ABS: 5.7 10*3/uL (ref 1.7–7.7)
Neutrophils Relative %: 66 %
Platelets: 209 10*3/uL (ref 150–400)
RBC: 5.02 MIL/uL (ref 4.22–5.81)
RDW: 11.3 % — ABNORMAL LOW (ref 11.5–15.5)
WBC: 8.4 10*3/uL (ref 4.0–10.5)
nRBC: 0 % (ref 0.0–0.2)

## 2018-07-02 LAB — COMPREHENSIVE METABOLIC PANEL
ALT: 18 U/L (ref 0–44)
AST: 13 U/L — ABNORMAL LOW (ref 15–41)
Albumin: 4.1 g/dL (ref 3.5–5.0)
Alkaline Phosphatase: 70 U/L (ref 38–126)
Anion gap: 5 (ref 5–15)
BUN: 26 mg/dL — ABNORMAL HIGH (ref 6–20)
CO2: 30 mmol/L (ref 22–32)
Calcium: 8.8 mg/dL — ABNORMAL LOW (ref 8.9–10.3)
Chloride: 104 mmol/L (ref 98–111)
Creatinine, Ser: 0.85 mg/dL (ref 0.61–1.24)
GFR calc Af Amer: >60 mL/min (ref >60)
GFR calc non Af Amer: >60 mL/min (ref >60)
Glucose, Bld: 94 mg/dL (ref 70–99)
Potassium: 3.6 mmol/L (ref 3.5–5.1)
SODIUM: 139 mmol/L (ref 135–145)
Total Bilirubin: 0.8 mg/dL (ref 0.3–1.2)
Total Protein: 6.8 g/dL (ref 6.5–8.1)

## 2018-07-02 LAB — URINALYSIS, COMPLETE (UACMP) WITH MICROSCOPIC
Bacteria, UA: NONE SEEN
Bilirubin Urine: NEGATIVE
Glucose, UA: NEGATIVE mg/dL
Hgb urine dipstick: NEGATIVE
Ketones, ur: NEGATIVE mg/dL
Leukocytes, UA: NEGATIVE
Nitrite: NEGATIVE
PH: 6 (ref 5.0–8.0)
Protein, ur: NEGATIVE mg/dL
Specific Gravity, Urine: 1.017 (ref 1.005–1.030)
Squamous Epithelial / HPF: NONE SEEN (ref 0–5)

## 2018-07-02 NOTE — ED Notes (Signed)
Pt also c/o frequent and inc urination and pain running down both legs; worse on L leg; sometimes gets numbness in L foot. Pulses bilat 2+.

## 2018-07-02 NOTE — ED Triage Notes (Signed)
Pt in from home for severe lower back/buttocks pain. C/o "sharp pressure" in back when standing but states it de when he sits down. States pain started after running two weeks ago and has gotten worse every since.

## 2018-07-02 NOTE — ED Provider Notes (Signed)
[facility] Emergency Department Provider Note  ____________________________________________  Time seen: Approximately 6:28 PM  I have reviewed the triage vital signs and the nursing notes.   HISTORY  Chief Complaint Back Pain    HPI Paul Pruitt is a 53 y.o. male who presents emergency department complaining of lower back pain with radicular symptoms in bilateral lower extremity.  Patient presents with an acute worsening of lower back pain approximately 2 weeks ago.  Patient is an avid runner, has had intermittent back pain from previous lumbar surgeries in the past.  Patient reports that 2 weeks ago he was running, had an acute worsening of his pain while running.  Patient took over-the-counter medications, followed up with his primary care and given steroid taper as well as Valium for muscle relaxation purposes.   Patient has completed the prednisone course with no relief of symptoms.  Patient reports that symptoms are worsening to the point where he is unable to walk his dog.  Patient is concerned as he has intense pain with any axial weightbearing while standing.  Patient reports that he leans forward or sits down symptoms improve.  Incidentally, patient has had perineal pain which is been evaluated by urology and diagnosed with BPH.  Patient reports that his pain has been ongoing for several years though it is very mild at baseline.  Patient reports that this has resolved at this time.  Patient does report some hesitancy and frequency in urination.  He denies any dysuria, penile pain, penile discharge.  No hematuria.  No history of nephrolithiasis.  Patient denies any abdominal pain, fevers or chills, nausea or vomiting.   Past Medical History:  Diagnosis Date  . Melanoma [facility])     Patient Active Problem List   Diagnosis Date Noted  . Chronic left lumbar radiculopathy 01/06/2018  . Osteitis pubis ([place]) 12/03/2017  . Thigh pain 11/09/2017  . Headache  07/06/2017  . History of cataract surgery 07/06/2017  . Family history of colon cancer 07/06/2017  . BPH with obstruction/lower urinary tract symptoms 12/21/2014  . Vitamin D deficiency 11/21/2014  . Hx of colonic polyp 11/21/2014  . Encounter for screening for malignant neoplasm of prostate 10/17/2013  . Acid reflux 02/15/2002    Past Surgical History:  Procedure Laterality Date  . BACK SURGERY N/A 2004, 2005   L3, 4 and L4,5  . CATARACT EXTRACTION, BILATERAL     08/12/16 and 08/19/16.  [facility] COLONOSCOPY  02/07/2016   By dr Carlisle Beers polyps, internal hemorrhoids, waiting path report  . HERNIA REPAIR    . LAMINECTOMY     x 2  . MELANOMA EXCISION     chest area  . TONSILLECTOMY      Prior to Admission medications   Medication Sig Start Date End Date Taking? Authorizing Provider  diazepam (VALIUM) 2 MG tablet Take 1/2 or 1 tablet as needed for muscle spasm 06/25/18   Guse, Jacquelynn Cree, FNP  meloxicam (MOBIC) 15 MG tablet Take 1 tablet (15 mg total) by mouth daily. 07/03/18   Shakira Los, Charline Bills, PA-C  methocarbamol (ROBAXIN) 500 MG tablet Take 1 tablet (500 mg total) by mouth 4 (four) times daily. 07/03/18   Lashunta Frieden, Charline Bills, PA-C  predniSONE (DELTASONE) 10 MG tablet Take 1 tablet (10 mg total) by mouth daily. 07/03/18   Quenisha Lovins, Charline Bills, PA-C  tamsulosin (FLOMAX) 0.4 MG CAPS capsule Take 1 capsule (0.4 mg total) by mouth daily. 07/03/18   Jennelle Pinkstaff, Charline Bills, PA-C  traMADol Veatrice Bourbon)  50 MG tablet Take 1 tablet (50 mg total) by mouth every 6 (six) hours as needed. 07/03/18   Xaria Judon, Charline Bills, PA-C    Allergies Patient has no known allergies.  Family History  Problem Relation Age of Onset  . Arthritis Mother   . Colon cancer Mother   . Hypertension Father   . Asthma Father   . Hypertension Brother   . Obesity Brother   . Asthma Son   . Obesity Son   . Kidney disease Neg Hx   . Prostate cancer Neg Hx     Social History Social History   Tobacco Use  . Smoking  status: Never Smoker  . Smokeless tobacco: Never Used  Substance Use Topics  . Alcohol use: Yes    Alcohol/week: 0.0 standard drinks    Comment: seldomly-3 drinks a year maybe  . Drug use: No     Review of Systems  Constitutional: No fever/chills Eyes: No visual changes. No discharge ENT: No upper respiratory complaints. Cardiovascular: no chest pain. Respiratory: no cough. No SOB. Gastrointestinal: No abdominal pain.  No nausea, no vomiting.  No diarrhea.  No constipation. Genitourinary: Negative for dysuria. No hematuria.  Positive for urinary frequency and hesitancy. Musculoskeletal: Low back pain with radicular symptoms down the left and right leg, worse on the left than right. Skin: Negative for rash, abrasions, lacerations, ecchymosis. Neurological: Negative for headaches, focal weakness or numbness. 10-point ROS otherwise negative.  ____________________________________________   PHYSICAL EXAM:  VITAL SIGNS: ED Triage Vitals  Enc Vitals Group     BP 07/02/18 1753 (!) 146/82     Pulse Rate 07/02/18 1753 64     Resp --      Temp 07/02/18 1753 97.6 F (36.4 C)     Temp Source 07/02/18 1753 Oral     SpO2 07/02/18 1753 97 %     Weight 07/02/18 1756 200 lb (90.7 kg)     Height 07/02/18 1756 6\' 4"  (1.93 m)     Head Circumference --      Peak Flow --      Pain Score 07/02/18 1756 1     Pain Loc --      Pain Edu? --      Excl. in [place]? --      Constitutional: Alert and oriented. Well appearing and in no acute distress. Eyes: Conjunctivae are normal. PERRL. EOMI. Head: Atraumatic. ENT:      Ears:       Nose: No congestion/rhinnorhea.      Mouth/Throat: Mucous membranes are moist.  Neck: No stridor.    Cardiovascular: Normal rate, regular rhythm. Normal S1 and S2.  Good peripheral circulation. Respiratory: Normal respiratory effort without tachypnea or retractions. Lungs CTAB. Good air entry to the bases with no decreased or absent breath sounds. Gastrointestinal:  Bowel sounds 4 quadrants. Soft and nontender to palpation. No guarding or rigidity. No palpable masses. No distention. No CVA tenderness. Musculoskeletal: Full range of motion to all extremities. No gross deformities appreciated.  Visualization of the lumbar spine reveals no visible abnormality.  Patient is able to extend, flex, rotate lumbar spine.  Patient denies any specific point tenderness to palpation of the lumbar spine but does have mild diffuse tenderness over L4-S1.  No step-off.  No tenderness to palpation of bilateral paraspinal muscle groups.  Patient is tender to palpation of the left sciatic notch.  Positive straight leg raise of the right.  Dorsalis pedis pulse intact bilateral lower extremities.  Sensation  intact and equal in all dermatomal distributions bilateral lower extremities. Neurologic:  Normal speech and language. No gross focal neurologic deficits are appreciated.  Skin:  Skin is warm, dry and intact. No rash noted. Psychiatric: Mood and affect are normal. Speech and behavior are normal. Patient exhibits appropriate insight and judgement.   ____________________________________________   LABS (all labs ordered are listed, but only abnormal results are displayed)  Labs Reviewed  URINALYSIS, COMPLETE (UACMP) WITH MICROSCOPIC - Abnormal; Notable for the following components:      Result Value   Color, Urine YELLOW (*)    APPearance CLEAR (*)    All other components within normal limits  COMPREHENSIVE METABOLIC PANEL - Abnormal; Notable for the following components:   BUN 26 (*)    Calcium 8.8 (*)    AST 13 (*)    All other components within normal limits  CBC WITH DIFFERENTIAL/PLATELET - Abnormal; Notable for the following components:   RDW 11.3 (*)    All other components within normal limits   ____________________________________________  EKG   ____________________________________________  RADIOLOGY I personally viewed and evaluated these images as part of  my medical decision making, as well as reviewing the written report by the radiologist.  Dg Lumbar Spine Complete  Result Date: 07/02/2018 CLINICAL DATA:  53 year old male with acute severe low back and buttock pain. Pain radiating down both legs. EXAM: LUMBAR SPINE - COMPLETE 4+ VIEW COMPARISON:  Lumbar radiographs 01/06/2018 and earlier. FINDINGS: Hypoplastic ribs designated at L1 for the purposes of this report. Full size ribs at T12. This is the same numbering system as on a 2017 MRI. Previous right lower lumbar laminectomies. Stable lumbar lordosis. Stable disc spaces. No acute osseous abnormality identified. Sacral ala and SI joints appear stable and within normal limits. Negative abdominal visceral contour. IMPRESSION: Stable radiographic appearance of the lumbar spine. Chronic postoperative changes at L4 and L5. Electronically Signed   By: Genevie Ann M.D.   On: 07/02/2018 19:32   Ct Pelvis Wo Contrast  Result Date: 07/03/2018 CLINICAL DATA:  Severe buttock pain EXAM: CT PELVIS WITHOUT CONTRAST TECHNIQUE: Multidetector CT imaging of the pelvis was performed following the standard protocol without intravenous contrast. COMPARISON:  None. FINDINGS: Urinary Tract:  No abnormality visualized. Bowel:  Unremarkable visualized pelvic bowel loops. Vascular/Lymphatic: No pathologically enlarged lymph nodes. No significant vascular abnormality seen. Reproductive:  Prostate is enlarged measuring 6 cm transverse. Other:  None Musculoskeletal: No suspicious bone lesions identified. Remote right L4 and L5 laminectomies. No acute fracture. IMPRESSION: 1. No acute pelvic abnormality. 2. Enlarged prostate. Electronically Signed   By: Ulyses Jarred M.D.   On: 07/03/2018 00:17   Mr Lumbar Spine Wo Contrast  Result Date: 07/02/2018 CLINICAL DATA:  53 year old male with acute severe low back and buttock pain. Pain radiating down both legs. EXAM: MRI LUMBAR SPINE WITHOUT CONTRAST TECHNIQUE: Multiplanar, multisequence MR  imaging of the lumbar spine was performed. No intravenous contrast was administered. COMPARISON:  Lumbar radiographs earlier today. Lumbar MRI 02/13/2016. FINDINGS: Segmentation: Same numbering system as on the prior MRI designating hypoplastic ribs at L1, otherwise normal lumbar segmentation. Alignment: Stable lumbar lordosis since 2017. Subtle retrolisthesis of L5 on S1. Vertebrae: Intermittent prominent benign vertebral body hemangiomas, most pronounced in L3 (series 7, image 11). Background bone marrow signal remains normal. There is new confluent marrow edema in the left sacral ala on series 7, image 17, but this level is not included on axial images. The visible SI joints appear within normal limits.  No other marrow edema or acute osseous abnormality. Conus medullaris and cauda equina: Conus extends to the T12-L1 level. No lower spinal cord or conus signal abnormality. Paraspinal and other soft tissues: Negative. Disc levels: The visible lower thoracic spine through L1-L2 remain normal. L2-L3: Stable circumferential disc bulge with posterior annular fissure. Stable borderline to mild spinal and lateral recess stenosis. L3-L4: Chronic postoperative changes to the right lamina with stable mild to moderate facet hypertrophy. Stable mild disc bulge. No convincing stenosis. L4-L5: Chronic postoperative changes to the right lamina with stable mild to moderate facet hypertrophy greater on the left. Circumferential disc bulge and endplate spurring. No spinal or lateral recess stenosis. Stable mild bilateral L4 foraminal stenosis. L5-S1: Stable postoperative changes to the right lamina with mild facet hypertrophy. Stable circumferential disc osteophyte complex with mild left and mild to moderate right L5 foraminal stenosis. IMPRESSION: 1. The symptomatic abnormality is favored to be confluent marrow edema in the left sacral ala, although incompletely visible on this exam. See if there was recent fall or trauma this  could reflect an acute or subacute sacral fracture. Insufficiency fracture is also a consideration if the patient is at risk for osteoporosis. In the absence of recent trauma recommend Pelvis CT or MRI to evaluate further. 2. Stable MRI appearance of the lumbar spine since 2017. Electronically Signed   By: Genevie Ann M.D.   On: 07/02/2018 22:52    ____________________________________________    PROCEDURES  Procedure(s) performed:    Procedures    Medications  ketorolac (TORADOL) 30 MG/ML injection 30 mg (has no administration in time range)  orphenadrine (NORFLEX) injection 60 mg (has no administration in time range)  dexamethasone (DECADRON) injection 10 mg (has no administration in time range)  traMADol (ULTRAM) tablet 50 mg (has no administration in time range)     ____________________________________________   INITIAL IMPRESSION / ASSESSMENT AND PLAN / ED COURSE  Pertinent labs & imaging results that were available during my care of the patient were reviewed by me and considered in my medical decision making (see chart for details).  Review of the [place] CSRS was performed in accordance of the [place] prior to dispensing any controlled drugs.  Clinical Course as of Jul 04 103  Fri Jul 02, 2018  1908 Patient presents the emergency department complaining of significant increase in lower back pain with urinary changes.  Overall, exam is reassuring the patient began neurovascularly intact in bilateral lower extremities.  With patient's history, reported symptoms, differential includes lumbar strain, sciatica, herniated disc, spinal stenosis, prostatitis.  Patient defers prostate exam at this time pending labs.  I will evaluate patient with basic labs, urinalysis, x-ray of the lower back.   [JC]  Sat Jul 03, 2018  0006 Patient presented to the emergency department with significant lower back pain.  Overall, patient's exam was reassuring.  However given symptoms, with urinary changes, MRI  was undertaken.  There is no impingement of the central canal or significant foraminal stenosis.  MRI did visualizeConfluent bone marrow edema.  With patient participating in significant running activities, stress fracture was of concern.  Given this concern, CT scan of the pelvis was obtained.   [JC]    Clinical Course User Index [JC] Jamyria Ozanich, Charline Bills, PA-C     Patient's diagnosis is consistent with lumbar radiculopathy with BPH.  Patient presents emergency department complaining of significant increase in lower back pain, radicular symptoms in the lower leg, urinary symptoms.  Overall, exam was reassuring given broad differential,  patient was evaluated with labs and imaging.  No significant structural findings on imaging.  Labs were reassuring.  At this time, patient will be treated symptomatically with increased dosing of anti-inflammatory, steroid taper, muscle relaxer, limited pain medicine if needed, Flomax.  I discussed the patient's course, prognosis with the patient.  He is agreeable to the plan.  He will follow-up with his sports medicine physician or neurosurgeon to ensure improvement on medication regimen.  Return precautions are discussed with patient..Patient is given ED precautions to return to the ED for any worsening or new symptoms.     ____________________________________________  FINAL CLINICAL IMPRESSION(S) / ED DIAGNOSES  Final diagnoses:  Lumbar radiculopathy  Benign prostatic hyperplasia with urinary hesitancy      NEW MEDICATIONS STARTED DURING THIS VISIT:  ED Discharge Orders         Ordered    predniSONE (DELTASONE) 10 MG tablet  Daily    Note to Pharmacy:  Take 6 pills x 2 days, 5 pills x 2 days, 4 pills x 2 days, 3 pills x 2 days, 2 pills x 2 days, and 1 pill x 2 days   07/03/18 0103    meloxicam (MOBIC) 15 MG tablet  Daily     07/03/18 0103    methocarbamol (ROBAXIN) 500 MG tablet  4 times daily     07/03/18 0103    tamsulosin (FLOMAX) 0.4 MG CAPS  capsule  Daily     07/03/18 0103    traMADol (ULTRAM) 50 MG tablet  Every 6 hours PRN     07/03/18 0103              This chart was dictated using voice recognition software/Dragon. Despite best efforts to proofread, errors can occur which can change the meaning. Any change was purely unintentional.    Darletta Moll, PA-C 07/03/18 0122    Nena Polio, MD 07/08/18 [phone]

## 2018-07-03 ENCOUNTER — Encounter: Payer: Self-pay | Admitting: Internal Medicine

## 2018-07-03 DIAGNOSIS — M545 Low back pain, unspecified: Secondary | ICD-10-CM

## 2018-07-03 MED ORDER — TRAMADOL HCL 50 MG PO TABS
50.0000 mg | ORAL_TABLET | Freq: Once | ORAL | Status: AC
  Administered 2018-07-03: 50 mg via ORAL
  Filled 2018-07-03: qty 1

## 2018-07-03 MED ORDER — TRAMADOL HCL 50 MG PO TABS: 50.0000 mg | ORAL_TABLET | Freq: Four times a day (QID) | ORAL | 0 refills | Status: DC | PRN

## 2018-07-03 MED ORDER — DEXAMETHASONE SODIUM PHOSPHATE 10 MG/ML IJ SOLN
10.0000 mg | Freq: Once | INTRAMUSCULAR | Status: AC
  Administered 2018-07-03: 10 mg via INTRAMUSCULAR
  Filled 2018-07-03: qty 1

## 2018-07-03 MED ORDER — MELOXICAM 15 MG PO TABS: 15.0000 mg | ORAL_TABLET | Freq: Every day | ORAL | 2 refills | Status: DC

## 2018-07-03 MED ORDER — TAMSULOSIN HCL 0.4 MG PO CAPS: 0.4000 mg | ORAL_CAPSULE | Freq: Every day | ORAL | 1 refills | Status: DC

## 2018-07-03 MED ORDER — ORPHENADRINE CITRATE 30 MG/ML IJ SOLN
60.0000 mg | Freq: Once | INTRAMUSCULAR | Status: AC
  Administered 2018-07-03: 60 mg via INTRAMUSCULAR
  Filled 2018-07-03: qty 2

## 2018-07-03 MED ORDER — KETOROLAC TROMETHAMINE 30 MG/ML IJ SOLN
30.0000 mg | Freq: Once | INTRAMUSCULAR | Status: AC
  Administered 2018-07-03: 30 mg via INTRAMUSCULAR
  Filled 2018-07-03: qty 1

## 2018-07-03 MED ORDER — PREDNISONE 10 MG PO TABS: 10.0000 mg | ORAL_TABLET | Freq: Every day | ORAL | 0 refills | Status: DC

## 2018-07-03 MED ORDER — METHOCARBAMOL 500 MG PO TABS: 500.0000 mg | ORAL_TABLET | Freq: Four times a day (QID) | ORAL | 2 refills | Status: DC

## 2018-07-03 NOTE — ED Provider Notes (Signed)
Got a call from Dr. Nevada Crane with radiology earlier today about a reread of this patient's imaging. He does have some concern for possible neoplastic disease. I tried calling the patient's number that is listed multiple times without response. Sent message to patient's PCP to follow up this concern.   Nance Pear, MD 07/03/18 2040

## 2018-07-03 NOTE — Discharge Instructions (Addendum)
Take the meloxicam every day for at least a month.  You will have 2 refills to take longer if you feel like this has improved her symptoms.  Take the prednisone taper until finished.  Take the Flomax at least 1 month for urinary symptoms.  Take the Robaxin every night for 1 week minimum, you may continue this longer if you feel this is beneficial.

## 2018-07-05 ENCOUNTER — Encounter: Payer: Self-pay | Admitting: Internal Medicine

## 2018-07-06 ENCOUNTER — Encounter: Payer: Self-pay | Admitting: Internal Medicine

## 2018-07-06 ENCOUNTER — Telehealth: Payer: Self-pay | Admitting: Internal Medicine

## 2018-07-06 NOTE — Telephone Encounter (Signed)
-----   Message from Nance Pear, MD sent at 07/03/2018  2:10 PM EST ----- Regarding: Recent ER visit Hi Dr. Nicki Reaper, I did not evaluate Paul Pruitt when he was in the ED yesterday but did get a call from Dr. Nevada Crane with radiology today who was reviewing his case and is concerned that some edema that was seen in the sacral region could be do to possible neoplastic disease. He does recommend that Mr. Aschoff obtain a pelvic MRI to better evaluate. I did try calling Mr. Isidro on his home and mobile phone but no one answered. I will try again in a little bit but wanted to pass that on. Thank you. Nance Pear

## 2018-07-06 NOTE — Telephone Encounter (Signed)
Tried multiple times to reach pt.  Left message.  See my chart message.  Spoke to pt today.  Discussed MRI and CT results.  Discussed questionable area on sacrum.  Discussed the need for MRI pelvis.  He is in agreement.  Order placed for MRI.

## 2018-07-06 NOTE — Telephone Encounter (Signed)
Spoke to pt regarding his scan results from the ER.  Discussed the questionable area on the sacrum.  Discussed the need for MRI pelvis.  He is in agreement.  Will try to get scheduled asap.

## 2018-07-06 NOTE — Telephone Encounter (Signed)
Patient has talked to provider see other my chart message

## 2018-07-08 DIAGNOSIS — L578 Other skin changes due to chronic exposure to nonionizing radiation: Secondary | ICD-10-CM | POA: Diagnosis not present

## 2018-07-08 DIAGNOSIS — D2230 Melanocytic nevi of unspecified part of face: Secondary | ICD-10-CM | POA: Diagnosis not present

## 2018-07-08 DIAGNOSIS — L820 Inflamed seborrheic keratosis: Secondary | ICD-10-CM | POA: Diagnosis not present

## 2018-07-08 DIAGNOSIS — D225 Melanocytic nevi of trunk: Secondary | ICD-10-CM | POA: Diagnosis not present

## 2018-07-08 DIAGNOSIS — D229 Melanocytic nevi, unspecified: Secondary | ICD-10-CM | POA: Diagnosis not present

## 2018-07-09 DIAGNOSIS — Z6825 Body mass index (BMI) 25.0-25.9, adult: Secondary | ICD-10-CM | POA: Diagnosis not present

## 2018-07-09 DIAGNOSIS — M4848XD Fatigue fracture of vertebra, sacral and sacrococcygeal region, subsequent encounter for fracture with routine healing: Secondary | ICD-10-CM | POA: Diagnosis not present

## 2018-07-09 DIAGNOSIS — R030 Elevated blood-pressure reading, without diagnosis of hypertension: Secondary | ICD-10-CM | POA: Diagnosis not present

## 2018-07-12 ENCOUNTER — Telehealth: Payer: Self-pay

## 2018-07-12 NOTE — Telephone Encounter (Signed)
FYI    Copied from [place] [id number]. Topic: General - Other >> Jul 12, 2018  2:48 PM Yvette Rack wrote: Reason for CRM: [place] with [facility] called to advise Lenna Sciara that patient is good to go.

## 2018-07-13 ENCOUNTER — Ambulatory Visit (HOSPITAL_COMMUNITY): Payer: BLUE CROSS/BLUE SHIELD

## 2018-07-13 ENCOUNTER — Ambulatory Visit (HOSPITAL_COMMUNITY)
Admission: RE | Admit: 2018-07-13 | Discharge: 2018-07-13 | Disposition: A | Discharge status: Home / Self Care | Payer: BLUE CROSS/BLUE SHIELD | Source: Ambulatory Visit | Attending: Internal Medicine | Admitting: Internal Medicine

## 2018-07-13 DIAGNOSIS — M545 Low back pain, unspecified: Secondary | ICD-10-CM

## 2018-07-13 DIAGNOSIS — S32110A Nondisplaced Zone I fracture of sacrum, initial encounter for closed fracture: Secondary | ICD-10-CM | POA: Diagnosis not present

## 2018-07-13 MED ORDER — GADOBUTROL 1 MMOL/ML IV SOLN
9.0000 mL | Freq: Once | INTRAVENOUS | Status: AC | PRN
  Administered 2018-07-13: 9 mL via INTRAVENOUS

## 2018-07-14 ENCOUNTER — Encounter: Payer: Self-pay | Admitting: Internal Medicine

## 2018-07-14 ENCOUNTER — Telehealth: Payer: Self-pay | Admitting: Podiatry

## 2018-07-14 ENCOUNTER — Ambulatory Visit: Payer: BLUE CROSS/BLUE SHIELD

## 2018-07-14 NOTE — Telephone Encounter (Signed)
Called and spoke to pt.  Discussed MRI results.  He will call me back with orthopedist of preference.  Will continue flomax.

## 2018-07-14 NOTE — Telephone Encounter (Signed)
Pt left message stating he was given my name from [place] office. He had paid for 2 pair of orthotics with his flex spending card for 2019 and insurance also paid for them. He wants to discuss reimbursement.

## 2018-07-16 ENCOUNTER — Encounter: Payer: Self-pay | Admitting: Internal Medicine

## 2018-07-16 DIAGNOSIS — S3210XD Unspecified fracture of sacrum, subsequent encounter for fracture with routine healing: Secondary | ICD-10-CM

## 2018-07-17 NOTE — Telephone Encounter (Signed)
Order placed for ortho referral to Dr Susa Day.

## 2018-07-19 ENCOUNTER — Encounter: Payer: Self-pay | Admitting: Internal Medicine

## 2018-07-19 ENCOUNTER — Ambulatory Visit: Payer: BLUE CROSS/BLUE SHIELD | Admitting: Internal Medicine

## 2018-07-19 VITALS — BP 116/68 | HR 67 | Temp 98.0°F | Resp 16 | Wt 201.8 lb

## 2018-07-19 DIAGNOSIS — Z13220 Encounter for screening for lipoid disorders: Secondary | ICD-10-CM

## 2018-07-19 DIAGNOSIS — M5416 Radiculopathy, lumbar region: Secondary | ICD-10-CM | POA: Diagnosis not present

## 2018-07-19 DIAGNOSIS — N138 Other obstructive and reflux uropathy: Secondary | ICD-10-CM

## 2018-07-19 DIAGNOSIS — R51 Headache: Secondary | ICD-10-CM

## 2018-07-19 DIAGNOSIS — M533 Sacrococcygeal disorders, not elsewhere classified: Secondary | ICD-10-CM | POA: Diagnosis not present

## 2018-07-19 DIAGNOSIS — Z125 Encounter for screening for malignant neoplasm of prostate: Secondary | ICD-10-CM

## 2018-07-19 DIAGNOSIS — R519 Headache, unspecified: Secondary | ICD-10-CM

## 2018-07-19 DIAGNOSIS — E559 Vitamin D deficiency, unspecified: Secondary | ICD-10-CM

## 2018-07-19 DIAGNOSIS — N401 Enlarged prostate with lower urinary tract symptoms: Secondary | ICD-10-CM

## 2018-07-19 LAB — BASIC METABOLIC PANEL
BUN: 36 mg/dL — ABNORMAL HIGH (ref 6–23)
CALCIUM: 9.7 mg/dL (ref 8.4–10.5)
CO2: 27 mEq/L (ref 19–32)
CREATININE: 1.03 mg/dL (ref 0.40–1.50)
Chloride: 104 mEq/L (ref 96–112)
GFR: 76.02 mL/min (ref >60.00)
Glucose, Bld: 88 mg/dL (ref 70–99)
Potassium: 4.4 mEq/L (ref 3.5–5.1)
Sodium: 140 mEq/L (ref 135–145)

## 2018-07-19 LAB — LIPID PANEL
Cholesterol: 170 mg/dL (ref 0–200)
HDL: 55.8 mg/dL (ref >39.00)
LDL CALC: 100 mg/dL — AB (ref 0–99)
NonHDL: 114.63
Total CHOL/HDL Ratio: 3
Triglycerides: 73 mg/dL (ref 0.0–149.0)
VLDL: 14.6 mg/dL (ref 0.0–40.0)

## 2018-07-19 LAB — PSA: PSA: 0.49 ng/mL (ref 0.10–4.00)

## 2018-07-19 NOTE — Assessment & Plan Note (Signed)
No significant headache.  Follow.

## 2018-07-19 NOTE — Assessment & Plan Note (Signed)
On flomax.  Follow.  May need urology referral. Check psa.

## 2018-07-19 NOTE — Progress Notes (Signed)
Patient ID: Paul Pruitt, male   DOB: 01-Sep-1965, 53 y.o.   MRN: [id number]   Subjective:    Patient ID: Paul Pruitt, male    DOB: 1965/09/18, 53 y.o.   MRN: [id number]  HPI  Patient here for ER follow up.  He was seen 07/02/18 with increased back pain.  Note reviewed.  MRI - no impingement of the central canal or significant foraminal stenosis.  CT - pelvis - initially unrevealing.  Radiology reviewed and recommended MRI pelvis.  MRI pelvis - sacral fracture.  He is doing better.  Is exercising - on elliptical.  No chest pain.  No sob.  No acid reflux.  No abdominal pain.  Bowels moving.  Some urinary frequency and nocturia.  On flomax.  No chest pain.  No sob.  Some minimal headache - intermittent.  No significant headache.     Past Medical History:  Diagnosis Date  . Melanoma [facility])    Past Surgical History:  Procedure Laterality Date  . BACK SURGERY N/A 2004, 2005   L3, 4 and L4,5  . CATARACT EXTRACTION, BILATERAL     08/12/16 and 08/19/16.  [facility] COLONOSCOPY  02/07/2016   By dr Carlisle Beers polyps, internal hemorrhoids, waiting path report  . HERNIA REPAIR    . LAMINECTOMY     x 2  . MELANOMA EXCISION     chest area  . TONSILLECTOMY     Family History  Problem Relation Age of Onset  . Arthritis Mother   . Colon cancer Mother   . Hypertension Father   . Asthma Father   . Hypertension Brother   . Obesity Brother   . Asthma Son   . Obesity Son   . Kidney disease Neg Hx   . Prostate cancer Neg Hx    Social History   Socioeconomic History  . Marital status: Married    Spouse name: Not on file  . Number of children: Not on file  . Years of education: Not on file  . Highest education level: Not on file  Occupational History  . Not on file  Social Needs  . Financial resource strain: Not on file  . Food insecurity:    Worry: Not on file    Inability: Not on file  . Transportation needs:    Medical: Not on file    Non-medical: Not on file  Tobacco Use  . Smoking status:  Never Smoker  . Smokeless tobacco: Never Used  Substance and Sexual Activity  . Alcohol use: Yes    Alcohol/week: 0.0 standard drinks    Comment: seldomly-3 drinks a year maybe  . Drug use: No  . Sexual activity: Yes    Birth control/protection: None  Lifestyle  . Physical activity:    Days per week: Not on file    Minutes per session: Not on file  . Stress: Not on file  Relationships  . Social connections:    Talks on phone: Not on file    Gets together: Not on file    Attends religious service: Not on file    Active member of club or organization: Not on file    Attends meetings of clubs or organizations: Not on file    Relationship status: Not on file  Other Topics Concern  . Not on file  Social History Narrative   Patient states heavy exercise. 7 days per week x 30-90 minutes.    Outpatient Encounter Medications as of 07/19/2018  Medication Sig  .  tamsulosin (FLOMAX) 0.4 MG CAPS capsule Take 1 capsule (0.4 mg total) by mouth daily.  . [DISCONTINUED] diazepam (VALIUM) 2 MG tablet Take 1/2 or 1 tablet as needed for muscle spasm  . [DISCONTINUED] meloxicam (MOBIC) 15 MG tablet Take 1 tablet (15 mg total) by mouth daily.  . [DISCONTINUED] methocarbamol (ROBAXIN) 500 MG tablet Take 1 tablet (500 mg total) by mouth 4 (four) times daily.  . [DISCONTINUED] predniSONE (DELTASONE) 10 MG tablet Take 1 tablet (10 mg total) by mouth daily.  . [DISCONTINUED] traMADol (ULTRAM) 50 MG tablet Take 1 tablet (50 mg total) by mouth every 6 (six) hours as needed.   No facility-administered encounter medications on file as of 07/19/2018.     Review of Systems  Constitutional: Negative for appetite change and unexpected weight change.  HENT: Negative for congestion and sinus pressure.   Respiratory: Negative for cough, chest tightness and shortness of breath.   Cardiovascular: Negative for chest pain, palpitations and leg swelling.  Gastrointestinal: Negative for abdominal pain, diarrhea, nausea  and vomiting.  Genitourinary: Positive for frequency. Negative for difficulty urinating and dysuria.  Musculoskeletal: Negative for joint swelling and myalgias.  Skin: Negative for color change and rash.  Neurological: Negative for dizziness and light-headedness.       Minimal headache - intermittent.   Psychiatric/Behavioral: Negative for agitation and dysphoric mood.       Objective:    Physical Exam Constitutional:      General: He is not in acute distress.    Appearance: Normal appearance. He is well-developed.  HENT:     Nose: Nose normal. No congestion.     Mouth/Throat:     Pharynx: No oropharyngeal exudate or posterior oropharyngeal erythema.  Cardiovascular:     Rate and Rhythm: Normal rate and regular rhythm.  Pulmonary:     Effort: Pulmonary effort is normal. No respiratory distress.     Breath sounds: Normal breath sounds.  Abdominal:     General: Bowel sounds are normal.     Palpations: Abdomen is soft.     Tenderness: There is no abdominal tenderness.  Musculoskeletal:        General: No swelling or tenderness.  Skin:    Findings: No erythema or rash.  Neurological:     Mental Status: He is alert.  Psychiatric:        Mood and Affect: Mood normal.        Behavior: Behavior normal.     BP 116/68 (BP Location: Left Arm, Patient Position: Sitting, Cuff Size: Normal)   Pulse 67   Temp 98 F (36.7 C) (Oral)   Resp 16   Wt 201 lb 12.8 oz (91.5 kg)   SpO2 99%   BMI 24.56 kg/m  Wt Readings from Last 3 Encounters:  07/19/18 201 lb 12.8 oz (91.5 kg)  07/02/18 200 lb (90.7 kg)  06/25/18 209 lb (94.8 kg)     Lab Results  Component Value Date   WBC 8.4 07/02/2018   HGB 14.8 07/02/2018   HCT 43.5 07/02/2018   PLT 209 07/02/2018   GLUCOSE 88 07/19/2018   CHOL 170 07/19/2018   TRIG 73.0 07/19/2018   HDL 55.80 07/19/2018   LDLCALC 100 (H) 07/19/2018   ALT 18 07/02/2018   AST 13 (L) 07/02/2018   NA 140 07/19/2018   K 4.4 07/19/2018   CL 104  07/19/2018   CREATININE 1.03 07/19/2018   BUN 36 (H) 07/19/2018   CO2 27 07/19/2018   TSH 3.36 12/03/2017  PSA 0.49 07/19/2018    Mr Pelvis W Wo Contrast  Result Date: 07/14/2018 CLINICAL DATA:  Low back pain. Left sacral ala edema on lumbar spine MRI. EXAM: MRI PELVIS WITHOUT AND WITH CONTRAST TECHNIQUE: Multiplanar multisequence MR imaging of the pelvis was performed both before and after administration of intravenous contrast. CONTRAST:  9 mL Gadavist intravenous contrast. COMPARISON:  CT pelvis dated July 02, 2018. MRI lumbar spine dated July 02, 2018. FINDINGS: Bones: Nondisplaced fracture of the left sacral ala with surrounding marrow edema. No focal bone lesion. No evidence of hip fracture or avascular necrosis. No dislocation. Parasymphyseal marrow edema with irregularity of the left parasymphyseal pubic bone. The sacroiliac joints are unremarkable. Articular cartilage and labrum Articular cartilage: No focal chondral defect or subchondral signal abnormality identified. Labrum: Grossly intact, although evaluation is limited due to lack of intra-articular fluid. No paralabral abnormality. Joint or bursal effusion Joint effusion: No significant hip joint effusion. Bursae: No focal periarticular fluid collection. Muscles and tendons Muscles and tendons: The visualized gluteus, hamstring and iliopsoas tendons appear normal. No muscle edema or atrophy. Other findings Miscellaneous: The prostate is mildly enlarged. The visualized internal pelvic contents otherwise appear unremarkable. IMPRESSION: 1. Nondisplaced fracture of the left sacral ala. In the absence of any trauma, this is likely insufficiency related. No underlying bone lesion. 2. Osteitis pubis. Electronically Signed   By: Titus Dubin M.D.   On: 07/14/2018 10:06       Assessment & Plan:   Problem List Items Addressed This Visit    BPH with obstruction/lower urinary tract symptoms    On flomax.  Follow.  May need urology  referral. Check psa.        Chronic left lumbar radiculopathy    Persistent low back pain.  Worsened recently.  Seen in ER as outlined.  Scans as outlined.  MRI - sacral fracture.  Being referred to ortho for further treatment and evaluation.        Headache    No significant headache.  Follow.        Vitamin D deficiency    Follow vitamin D level.         Other Visit Diagnoses    Screening cholesterol level    -  Primary   Relevant Orders   Lipid panel (Completed)   Prostate cancer screening       Relevant Orders   PSA (Completed)   Sacral pain       Relevant Orders   Basic metabolic panel (Completed)       Einar Pheasant, MD

## 2018-07-19 NOTE — Assessment & Plan Note (Signed)
Follow vitamin D level.  

## 2018-07-19 NOTE — Assessment & Plan Note (Signed)
Persistent low back pain.  Worsened recently.  Seen in ER as outlined.  Scans as outlined.  MRI - sacral fracture.  Being referred to ortho for further treatment and evaluation.

## 2018-08-11 DIAGNOSIS — M2170 Unequal limb length (acquired), unspecified site: Secondary | ICD-10-CM | POA: Diagnosis not present

## 2018-08-11 DIAGNOSIS — S3210XA Unspecified fracture of sacrum, initial encounter for closed fracture: Secondary | ICD-10-CM | POA: Diagnosis not present

## 2018-08-11 DIAGNOSIS — M419 Scoliosis, unspecified: Secondary | ICD-10-CM | POA: Diagnosis not present

## 2018-08-11 DIAGNOSIS — M868X8 Other osteomyelitis, other site: Secondary | ICD-10-CM | POA: Diagnosis not present

## 2018-08-13 DIAGNOSIS — M2170 Unequal limb length (acquired), unspecified site: Secondary | ICD-10-CM | POA: Insufficient documentation

## 2018-08-13 DIAGNOSIS — M4840XA Fatigue fracture of vertebra, site unspecified, initial encounter for fracture: Secondary | ICD-10-CM | POA: Insufficient documentation

## 2018-08-13 DIAGNOSIS — M419 Scoliosis, unspecified: Secondary | ICD-10-CM | POA: Insufficient documentation

## 2018-08-16 ENCOUNTER — Telehealth: Payer: Self-pay | Admitting: Podiatry

## 2018-08-16 NOTE — Telephone Encounter (Signed)
Pt left a message on Saturday asking about his refund that he was to get for the orthotics he paid for and so did the insurance.   I verified with Jocelyn Lamer and she said it was approved by [employer] today and that it should be going out in a couple of weeks.  I called pt and left a message with this information and to call if any further questions.

## 2018-12-07 ENCOUNTER — Encounter: Payer: BLUE CROSS/BLUE SHIELD | Admitting: Internal Medicine

## 2018-12-24 DIAGNOSIS — H00022 Hordeolum internum right lower eyelid: Secondary | ICD-10-CM | POA: Diagnosis not present

## 2019-01-26 ENCOUNTER — Other Ambulatory Visit: Payer: Self-pay

## 2019-01-28 ENCOUNTER — Encounter: Payer: Self-pay | Admitting: Internal Medicine

## 2019-01-28 ENCOUNTER — Other Ambulatory Visit: Payer: Self-pay

## 2019-01-28 ENCOUNTER — Ambulatory Visit (INDEPENDENT_AMBULATORY_CARE_PROVIDER_SITE_OTHER): Payer: BC Managed Care – PPO | Admitting: Internal Medicine

## 2019-01-28 VITALS — BP 126/72 | HR 58 | Temp 97.3°F | Resp 16 | Ht 76.0 in | Wt 203.4 lb

## 2019-01-28 DIAGNOSIS — Z0000 Encounter for general adult medical examination without abnormal findings: Secondary | ICD-10-CM

## 2019-01-28 DIAGNOSIS — N401 Enlarged prostate with lower urinary tract symptoms: Secondary | ICD-10-CM | POA: Diagnosis not present

## 2019-01-28 DIAGNOSIS — N138 Other obstructive and reflux uropathy: Secondary | ICD-10-CM | POA: Diagnosis not present

## 2019-01-28 NOTE — Progress Notes (Signed)
Patient ID: SEANN GENTHER, male   DOB: 05/30/1966, 53 y.o.   MRN: [id number]   Subjective:    Patient ID: IGNACE MANDIGO, male    DOB: 1965/12/18, 53 y.o.   MRN: [id number]  HPI  Patient here for his physical exam.  He reports he is doing well.  Still running, but has cut down to 4 miles per day 3-4x/week.  Was having issues with his back/leg.  Not an issue now since cutting back.  No chest pain.  No sob.  No acid reflux.  No abdominal pain.  Bowels moving.  No urine change.  Diagnosed with BPH.  Was on flomax.  Did not feel made a big difference.  Off now.  Desires no further intervention.  Handling stress.  Working.     Past Medical History:  Diagnosis Date  . Melanoma [facility])    Past Surgical History:  Procedure Laterality Date  . BACK SURGERY N/A 2004, 2005   L3, 4 and L4,5  . CATARACT EXTRACTION, BILATERAL     08/12/16 and 08/19/16.  [facility] COLONOSCOPY  02/07/2016   By dr Carlisle Beers polyps, internal hemorrhoids, waiting path report  . HERNIA REPAIR    . LAMINECTOMY     x 2  . MELANOMA EXCISION     chest area  . TONSILLECTOMY     Family History  Problem Relation Age of Onset  . Arthritis Mother   . Colon cancer Mother   . Hypertension Father   . Asthma Father   . Hypertension Brother   . Obesity Brother   . Asthma Son   . Obesity Son   . Kidney disease Neg Hx   . Prostate cancer Neg Hx    Social History   Socioeconomic History  . Marital status: Married    Spouse name: Not on file  . Number of children: Not on file  . Years of education: Not on file  . Highest education level: Not on file  Occupational History  . Not on file  Social Needs  . Financial resource strain: Not on file  . Food insecurity    Worry: Not on file    Inability: Not on file  . Transportation needs    Medical: Not on file    Non-medical: Not on file  Tobacco Use  . Smoking status: Never Smoker  . Smokeless tobacco: Never Used  Substance and Sexual Activity  . Alcohol use: Yes   Alcohol/week: 0.0 standard drinks    Comment: seldomly-3 drinks a year maybe  . Drug use: No  . Sexual activity: Yes    Birth control/protection: None  Lifestyle  . Physical activity    Days per week: Not on file    Minutes per session: Not on file  . Stress: Not on file  Relationships  . Social Herbalist on phone: Not on file    Gets together: Not on file    Attends religious service: Not on file    Active member of club or organization: Not on file    Attends meetings of clubs or organizations: Not on file    Relationship status: Not on file  Other Topics Concern  . Not on file  Social History Narrative   Patient states heavy exercise. 7 days per week x 30-90 minutes.    Outpatient Encounter Medications as of 01/28/2019  Medication Sig  . [DISCONTINUED] tamsulosin (FLOMAX) 0.4 MG CAPS capsule Take 1 capsule (0.4 mg total) by mouth daily.  No facility-administered encounter medications on file as of 01/28/2019.     Review of Systems  Constitutional: Negative for appetite change and unexpected weight change.  HENT: Negative for congestion and sinus pressure.   Eyes: Negative for pain and visual disturbance.  Respiratory: Negative for cough, chest tightness and shortness of breath.   Cardiovascular: Negative for chest pain, palpitations and leg swelling.  Gastrointestinal: Negative for abdominal pain, diarrhea, nausea and vomiting.  Genitourinary: Negative for difficulty urinating and dysuria.  Musculoskeletal: Negative for joint swelling and myalgias.  Skin: Negative for color change and rash.  Neurological: Negative for dizziness, light-headedness and headaches.  Hematological: Negative for adenopathy. Does not bruise/bleed easily.  Psychiatric/Behavioral: Negative for agitation and dysphoric mood.       Objective:    Physical Exam Constitutional:      General: He is not in acute distress.    Appearance: Normal appearance. He is well-developed.  HENT:      Head: Normocephalic and atraumatic.     Right Ear: External ear normal.     Left Ear: External ear normal.     Mouth/Throat:     Pharynx: No oropharyngeal exudate.  Eyes:     General: No scleral icterus.       Right eye: No discharge.        Left eye: No discharge.     Conjunctiva/sclera: Conjunctivae normal.  Neck:     Musculoskeletal: Neck supple. No muscular tenderness.     Thyroid: No thyromegaly.  Cardiovascular:     Rate and Rhythm: Normal rate and regular rhythm.  Pulmonary:     Effort: No respiratory distress.     Breath sounds: Normal breath sounds. No wheezing.  Abdominal:     General: Bowel sounds are normal.     Palpations: Abdomen is soft.     Tenderness: There is no abdominal tenderness.  Genitourinary:    Comments: Not performed.   Musculoskeletal:        General: No swelling or tenderness.  Lymphadenopathy:     Cervical: No cervical adenopathy.  Skin:    Findings: No erythema or rash.  Neurological:     Mental Status: He is alert and oriented to person, place, and time.  Psychiatric:        Mood and Affect: Mood normal.        Behavior: Behavior normal.     BP 126/72   Pulse (!) 58   Temp (!) 97.3 F (36.3 C) (Temporal)   Resp 16   Ht 6\' 4"  (1.93 m)   Wt 203 lb 6.4 oz (92.3 kg)   SpO2 98%   BMI 24.76 kg/m  Wt Readings from Last 3 Encounters:  01/28/19 203 lb 6.4 oz (92.3 kg)  07/19/18 201 lb 12.8 oz (91.5 kg)  07/02/18 200 lb (90.7 kg)     Lab Results  Component Value Date   WBC 8.4 07/02/2018   HGB 14.8 07/02/2018   HCT 43.5 07/02/2018   PLT 209 07/02/2018   GLUCOSE 88 07/19/2018   CHOL 170 07/19/2018   TRIG 73.0 07/19/2018   HDL 55.80 07/19/2018   LDLCALC 100 (H) 07/19/2018   ALT 18 07/02/2018   AST 13 (L) 07/02/2018   NA 140 07/19/2018   K 4.4 07/19/2018   CL 104 07/19/2018   CREATININE 1.03 07/19/2018   BUN 36 (H) 07/19/2018   CO2 27 07/19/2018   TSH 3.36 12/03/2017   PSA 0.49 07/19/2018    Mr Pelvis W Wo Contrast  Result Date: 07/14/2018 CLINICAL DATA:  Low back pain. Left sacral ala edema on lumbar spine MRI. EXAM: MRI PELVIS WITHOUT AND WITH CONTRAST TECHNIQUE: Multiplanar multisequence MR imaging of the pelvis was performed both before and after administration of intravenous contrast. CONTRAST:  9 mL Gadavist intravenous contrast. COMPARISON:  CT pelvis dated July 02, 2018. MRI lumbar spine dated July 02, 2018. FINDINGS: Bones: Nondisplaced fracture of the left sacral ala with surrounding marrow edema. No focal bone lesion. No evidence of hip fracture or avascular necrosis. No dislocation. Parasymphyseal marrow edema with irregularity of the left parasymphyseal pubic bone. The sacroiliac joints are unremarkable. Articular cartilage and labrum Articular cartilage: No focal chondral defect or subchondral signal abnormality identified. Labrum: Grossly intact, although evaluation is limited due to lack of intra-articular fluid. No paralabral abnormality. Joint or bursal effusion Joint effusion: No significant hip joint effusion. Bursae: No focal periarticular fluid collection. Muscles and tendons Muscles and tendons: The visualized gluteus, hamstring and iliopsoas tendons appear normal. No muscle edema or atrophy. Other findings Miscellaneous: The prostate is mildly enlarged. The visualized internal pelvic contents otherwise appear unremarkable. IMPRESSION: 1. Nondisplaced fracture of the left sacral ala. In the absence of any trauma, this is likely insufficiency related. No underlying bone lesion. 2. Osteitis pubis. Electronically Signed   By: Titus Dubin M.D.   On: 07/14/2018 10:06       Assessment & Plan:   Problem List Items Addressed This Visit    BPH with obstruction/lower urinary tract symptoms    Stopped taking flomax.  Did not feel made a big difference.  Desires no further intervention.  Follow.       Healthcare maintenance    Physical today 01/27/19.  Check psa with next labs.  Colonoscopy  01/2016.  Recommended f/u colonoscopy 01/2021.            Einar Pheasant, MD

## 2019-01-30 ENCOUNTER — Encounter: Payer: Self-pay | Admitting: Internal Medicine

## 2019-01-30 DIAGNOSIS — Z0000 Encounter for general adult medical examination without abnormal findings: Secondary | ICD-10-CM | POA: Insufficient documentation

## 2019-01-30 NOTE — Assessment & Plan Note (Signed)
Physical today 01/27/19.  Check psa with next labs.  Colonoscopy 01/2016.  Recommended f/u colonoscopy 01/2021.

## 2019-01-30 NOTE — Assessment & Plan Note (Signed)
Stopped taking flomax.  Did not feel made a big difference.  Desires no further intervention.  Follow.

## 2019-03-07 ENCOUNTER — Other Ambulatory Visit: Payer: Self-pay

## 2019-03-07 ENCOUNTER — Ambulatory Visit (INDEPENDENT_AMBULATORY_CARE_PROVIDER_SITE_OTHER): Payer: BC Managed Care – PPO

## 2019-03-07 DIAGNOSIS — Z23 Encounter for immunization: Secondary | ICD-10-CM

## 2019-05-12 ENCOUNTER — Other Ambulatory Visit: Payer: Self-pay

## 2019-05-12 ENCOUNTER — Emergency Department
Admission: EM | Admit: 2019-05-12 | Discharge: 2019-05-12 | Disposition: A | Discharge status: Home / Self Care | Payer: BC Managed Care – PPO | Attending: Emergency Medicine | Admitting: Emergency Medicine

## 2019-05-12 DIAGNOSIS — S61411A Laceration without foreign body of right hand, initial encounter: Secondary | ICD-10-CM | POA: Diagnosis not present

## 2019-05-12 DIAGNOSIS — W228XXA Striking against or struck by other objects, initial encounter: Secondary | ICD-10-CM | POA: Insufficient documentation

## 2019-05-12 DIAGNOSIS — Y9389 Activity, other specified: Secondary | ICD-10-CM | POA: Diagnosis not present

## 2019-05-12 DIAGNOSIS — Z23 Encounter for immunization: Secondary | ICD-10-CM | POA: Insufficient documentation

## 2019-05-12 DIAGNOSIS — Y999 Unspecified external cause status: Secondary | ICD-10-CM | POA: Insufficient documentation

## 2019-05-12 DIAGNOSIS — Y929 Unspecified place or not applicable: Secondary | ICD-10-CM | POA: Insufficient documentation

## 2019-05-12 MED ORDER — LIDOCAINE HCL (PF) 1 % IJ SOLN
5.0000 mL | Freq: Once | INTRAMUSCULAR | Status: AC
  Administered 2019-05-12: 17:00:00 5 mL
  Filled 2019-05-12: qty 5

## 2019-05-12 MED ORDER — TETANUS-DIPHTH-ACELL PERTUSSIS 5-2.5-18.5 LF-MCG/0.5 IM SUSP
0.5000 mL | Freq: Once | INTRAMUSCULAR | Status: AC
  Administered 2019-05-12: 0.5 mL via INTRAMUSCULAR
  Filled 2019-05-12: qty 0.5

## 2019-05-12 NOTE — ED Triage Notes (Signed)
Pt reports that he was breaking up altercation with his dogs and the collar cut top of right hand - bleeding controlled at this time

## 2019-05-12 NOTE — ED Provider Notes (Signed)
[facility] Emergency Department Provider Note ____________________________________________  Time seen: 1630  I have reviewed the triage vital signs and the nursing notes.  HISTORY  Chief Complaint  Laceration  HPI Paul Pruitt is a 53 y.o. male presents to the ED accompanied by his wife, for valuation management of an accidental laceration to the dorsal right hand.  Patient describes the laceration between the third and fourth digits occurred after he was attaching his dog's leather collar.  He describes being cut by the leather collar.  He denies any other injury at this time.  He reports his tetanus is not currently up-to-date.  Past Medical History:  Diagnosis Date  . Melanoma [facility])     Patient Active Problem List   Diagnosis Date Noted  . Healthcare maintenance 01/30/2019  . Chronic left lumbar radiculopathy 01/06/2018  . Thigh pain 11/09/2017  . Headache 07/06/2017  . History of cataract surgery 07/06/2017  . Family history of colon cancer 07/06/2017  . BPH with obstruction/lower urinary tract symptoms 12/21/2014  . Vitamin D deficiency 11/21/2014  . Hx of colonic polyp 11/21/2014  . Encounter for screening for malignant neoplasm of prostate 10/17/2013  . Acid reflux 02/15/2002    Past Surgical History:  Procedure Laterality Date  . BACK SURGERY N/A 2004, 2005   L3, 4 and L4,5  . CATARACT EXTRACTION, BILATERAL     08/12/16 and 08/19/16.  Marland Kitchen COLONOSCOPY  02/07/2016   By dr Carlisle Beers polyps, internal hemorrhoids, waiting path report  . HERNIA REPAIR    . LAMINECTOMY     x 2  . MELANOMA EXCISION     chest area  . TONSILLECTOMY      Prior to Admission medications   Not on File    Allergies Patient has no known allergies.  Family History  Problem Relation Age of Onset  . Arthritis Mother   . Colon cancer Mother   . Hypertension Father   . Asthma Father   . Hypertension Brother   . Obesity Brother   . Asthma Son   . Obesity Son    . Kidney disease Neg Hx   . Prostate cancer Neg Hx     Social History Social History   Tobacco Use  . Smoking status: Never Smoker  . Smokeless tobacco: Never Used  Substance Use Topics  . Alcohol use: Not Currently    Alcohol/week: 0.0 standard drinks    Comment: seldomly-3 drinks a year maybe  . Drug use: No    Review of Systems  Constitutional: Negative for fever. Cardiovascular: Negative for chest pain. Respiratory: Negative for shortness of breath. Musculoskeletal: Negative for back pain. Skin: Negative for rash.  Right hand laceration as above. Neurological: Negative for headaches, focal weakness or numbness. ____________________________________________  PHYSICAL EXAM:  VITAL SIGNS: ED Triage Vitals  Enc Vitals Group     BP 05/12/19 1555 (!) 145/78     Pulse Rate 05/12/19 1555 66     Resp 05/12/19 1555 18     Temp 05/12/19 1555 98 F (36.7 C)     Temp Source 05/12/19 1555 Oral     SpO2 05/12/19 1555 100 %     Weight 05/12/19 1554 200 lb (90.7 kg)     Height 05/12/19 1554 6\' 4"  (1.93 m)     Head Circumference --      Peak Flow --      Pain Score 05/12/19 1554 3     Pain Loc --  Pain Edu? --      Excl. in [place]? --     Constitutional: Alert and oriented. Well appearing and in no distress. Head: Normocephalic and atraumatic. Eyes: Conjunctivae are normal. Normal extraocular movements Cardiovascular: Normal rate, regular rhythm. Normal distal pulses. Respiratory: Normal respiratory effort.  Musculoskeletal: Right hand with normal composite fist.  Nontender with normal range of motion in all extremities.  Neurologic:  Normal gait without ataxia. Normal speech and language. No gross focal neurologic deficits are appreciated. Skin:  Skin is warm, dry and intact. No rash noted.  Dorsal right hand with a 3 cm laceration between the third and fourth MCPs. ____________________________________________  PROCEDURES  Tdap 0.5 ml IM .Marland KitchenLaceration  Repair  Date/Time: 05/12/2019 4:43 PM Performed by: Melvenia Needles, PA-C Authorized by: Melvenia Needles, PA-C   Consent:    Consent obtained:  Verbal   Consent given by:  Patient   Risks discussed:  Pain and poor wound healing Anesthesia (see MAR for exact dosages):    Anesthesia method:  Local infiltration   Local anesthetic:  Lidocaine 1% w/o epi Laceration details:    Location:  Hand   Hand location:  R hand, dorsum   Length (cm):  3   Depth (mm):  3 Repair type:    Repair type:  Simple Pre-procedure details:    Preparation:  Patient was prepped and draped in usual sterile fashion Exploration:    Contaminated: no   Treatment:    Area cleansed with:  Betadine and saline   Amount of cleaning:  Standard   Irrigation solution:  Sterile saline   Irrigation method:  Syringe Skin repair:    Repair method:  Sutures   Suture size:  4-0   Suture material:  Nylon   Suture technique:  Simple interrupted   Number of sutures:  5 Approximation:    Approximation:  Close Post-procedure details:    Dressing:  Non-adherent dressing   Patient tolerance of procedure:  Tolerated well, no immediate complications   ____________________________________________  INITIAL IMPRESSION / ASSESSMENT AND PLAN / ED COURSE  Patient with ED evaluation management of axonal laceration to the dorsal right hand, to contact with his dog's leather collar.  He presents today and his tetanus is updated.  He is discharged to the care of his wife with wound care instructions and supplies.  WILLIIAM Pruitt was evaluated in Emergency Department on 05/12/2019 for the symptoms described in the history of present illness. He was evaluated in the context of the global COVID-19 pandemic, which necessitated consideration that the patient might be at risk for infection with the SARS-CoV-2 virus that causes COVID-19. Institutional protocols and algorithms that pertain to the evaluation of patients at risk  for COVID-19 are in a state of rapid change based on information released by regulatory bodies including the CDC and federal and state organizations. These policies and algorithms were followed during the patient's care in the ED. ____________________________________________  FINAL CLINICAL IMPRESSION(S) / ED DIAGNOSES  Final diagnoses:  Laceration of right hand without foreign body, initial encounter      Melvenia Needles, PA-C 05/12/19 1724    Blake Divine, MD 05/12/19 2030

## 2019-05-12 NOTE — ED Notes (Signed)
Pt's wife at bedside.

## 2019-05-12 NOTE — Discharge Instructions (Addendum)
Keep the wound clean, dry, and covered. Remove the sutures in 10-12 days.
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
## 2019-05-19 ENCOUNTER — Telehealth: Payer: Self-pay | Admitting: Internal Medicine

## 2019-05-19 NOTE — Telephone Encounter (Signed)
Pt wife called to get a refill on pt Ibuprofen

## 2019-05-20 MED ORDER — IBUPROFEN 800 MG PO TABS: 800.0000 mg | ORAL_TABLET | Freq: Two times a day (BID) | ORAL | 0 refills | Status: DC | PRN

## 2019-05-20 NOTE — Telephone Encounter (Signed)
Mychart sent.

## 2019-05-20 NOTE — Telephone Encounter (Signed)
Called patient to get more information. Pt stated that he has ibuprofen 800 mg that he has used prn over the last couple of years for back pain. Back pain is better but occasionally will take ibuprofen if needed. Pt was seen in ED on 11/27 and had 5 sutures placed in his hand. Wants refill on ibuprofen to have on hand as needed. I do not see where you have ever prescribed this. Are you okay with sending in rx?

## 2019-05-20 NOTE — Telephone Encounter (Signed)
I have sent in prescription for ibuprofen to have as needed.  Let me know if persistent problems.  Take with food.

## 2019-06-01 DIAGNOSIS — S61411A Laceration without foreign body of right hand, initial encounter: Secondary | ICD-10-CM | POA: Diagnosis not present

## 2019-07-14 DIAGNOSIS — L578 Other skin changes due to chronic exposure to nonionizing radiation: Secondary | ICD-10-CM | POA: Diagnosis not present

## 2019-07-14 DIAGNOSIS — Z86018 Personal history of other benign neoplasm: Secondary | ICD-10-CM | POA: Diagnosis not present

## 2019-07-14 DIAGNOSIS — L820 Inflamed seborrheic keratosis: Secondary | ICD-10-CM | POA: Diagnosis not present

## 2019-07-14 DIAGNOSIS — L570 Actinic keratosis: Secondary | ICD-10-CM | POA: Diagnosis not present

## 2019-07-14 DIAGNOSIS — Z1283 Encounter for screening for malignant neoplasm of skin: Secondary | ICD-10-CM | POA: Diagnosis not present

## 2019-07-14 DIAGNOSIS — L821 Other seborrheic keratosis: Secondary | ICD-10-CM | POA: Diagnosis not present

## 2019-08-03 ENCOUNTER — Other Ambulatory Visit: Payer: Self-pay | Admitting: Medical

## 2019-08-03 ENCOUNTER — Ambulatory Visit: Payer: Self-pay

## 2019-08-03 ENCOUNTER — Other Ambulatory Visit: Payer: Self-pay

## 2019-08-03 ENCOUNTER — Telehealth: Payer: Self-pay | Admitting: Medical

## 2019-08-03 DIAGNOSIS — Z20822 Contact with and (suspected) exposure to covid-19: Secondary | ICD-10-CM

## 2019-08-03 DIAGNOSIS — R519 Headache, unspecified: Secondary | ICD-10-CM

## 2019-08-03 DIAGNOSIS — R0981 Nasal congestion: Secondary | ICD-10-CM

## 2019-08-03 LAB — POC COVID19 BINAXNOW: SARS Coronavirus 2 Ag: NEGATIVE
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
## 2019-08-03 NOTE — Progress Notes (Signed)
Order Covid-19 testing per patient request and symptoms of headache and head congestion, will do virtual at  3:30pm today.

## 2019-08-04 LAB — NOVEL CORONAVIRUS, NAA: SARS-CoV-2, NAA: DETECTED — AB

## 2019-08-05 ENCOUNTER — Telehealth: Payer: Self-pay | Admitting: Medical

## 2019-08-05 ENCOUNTER — Encounter: Payer: Self-pay | Admitting: Medical
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
## 2019-08-08 ENCOUNTER — Encounter: Payer: Self-pay | Admitting: Family Medicine

## 2019-08-08 ENCOUNTER — Ambulatory Visit (INDEPENDENT_AMBULATORY_CARE_PROVIDER_SITE_OTHER): Payer: Self-pay | Admitting: Family Medicine

## 2019-08-08 ENCOUNTER — Telehealth: Payer: Self-pay | Admitting: Internal Medicine

## 2019-08-08 ENCOUNTER — Other Ambulatory Visit: Payer: Self-pay

## 2019-08-08 ENCOUNTER — Ambulatory Visit (INDEPENDENT_AMBULATORY_CARE_PROVIDER_SITE_OTHER): Payer: HRSA Program

## 2019-08-08 ENCOUNTER — Telehealth: Payer: Self-pay | Admitting: Medical

## 2019-08-08 VITALS — BP 146/84 | HR 58 | Temp 98.6°F | Ht 76.0 in | Wt 201.0 lb

## 2019-08-08 DIAGNOSIS — U071 COVID-19: Secondary | ICD-10-CM

## 2019-08-08 DIAGNOSIS — R0602 Shortness of breath: Secondary | ICD-10-CM

## 2019-08-08 DIAGNOSIS — J988 Other specified respiratory disorders: Secondary | ICD-10-CM

## 2019-08-08 MED ORDER — PREDNISONE 50 MG PO TABS: 50.0000 mg | ORAL_TABLET | Freq: Every day | ORAL | 0 refills | Status: DC

## 2019-08-08 MED ORDER — AZITHROMYCIN 250 MG PO TABS: ORAL_TABLET | ORAL | 0 refills | Status: DC

## 2019-08-08 MED ORDER — METHYLPREDNISOLONE SODIUM SUCC 125 MG IJ SOLR
62.0000 mg | Freq: Once | INTRAMUSCULAR | Status: AC
  Administered 2019-08-08: 62 mg via INTRAMUSCULAR

## 2019-08-08 MED ORDER — ALBUTEROL SULFATE HFA 108 (90 BASE) MCG/ACT IN AERS: 2.0000 | INHALATION_SPRAY | RESPIRATORY_TRACT | Status: DC | PRN

## 2019-08-08 NOTE — Progress Notes (Signed)
Patient calls with complaints of shortness of breath "a little bit" that he has noticed.  He calls me because his wife who is a nurse would like him evaluated. He tried to get in with his primary but could not have a virtual until Wednesday and Pulmonary could not do a virtual till Thursday.  He states going up the stairs in his how, makes him short of breath or moving more physically. He denies fever or chills, sore throat and headache and headache are now  resolved. He does state that he has some ulcerations on the back of his throat which seem to occur when he is sick and he states they are viral. He does have a history of getting short of breath periodically " but no one can figure it out". On my last phone call with him I recommended he see pulmonary  For these issues of shortness of breath. He has no prior history of Asthma  Or other lung disorder.  He felt short of breath on Sunday ( 1 week ago) but it quickly resolved.  The shortness of breath returned  2 days ago He hears no wheezing. He does complain of a burning sensation in his lungs and reassures me he is not have a heart attack.  PE: None was preformed due to  Telemedicine  Appointment. He is able to talk in complete sentences and he does not sound short of breath on the phone.  D/P Shortness of Beath I reviewed with the patient that  It should be evaluated (O2 sats and Chest x-ray),  I discussed treatment possiblities and further evaluation can be either by an Urgent Care or the Emergency Department. He states he does not need the Emergency Department. Given red flags of when to go to the Emergency Department.  He says he overall does not feel that bad, but his wife was concerned.  He states he will make and appointment with the Urgent Care [facility] for tomorrow and get evaluated. I reviewed with patient if he was up in the middle of the night or any other time feels short of breath to go to the Emergency Department. He verbalizes  understanding and has no questions at the end of our conversation. I asked that he keep me updated. He stated he would.

## 2019-08-08 NOTE — Telephone Encounter (Signed)
So pt aware of appt with respiratory clinic.

## 2019-08-08 NOTE — Telephone Encounter (Signed)
Yes, Rob said that he would notify pt of appt time. He just wanted you to be aware

## 2019-08-08 NOTE — Patient Instructions (Signed)
Complete all medication as prescribed.  If any symptoms worsen, return follow-up.    Shortness of Breath, Adult Shortness of breath means you have trouble breathing. Shortness of breath could be a sign of a medical problem. Follow these instructions at home:   Watch for any changes in your symptoms.  Do not use any products that contain nicotine or tobacco, such as cigarettes, e-cigarettes, and chewing tobacco.  Do not smoke. Smoking can cause shortness of breath. If you need help to quit smoking, ask your doctor.  Avoid things that can make it harder to breathe, such as: ? Mold. ? Dust. ? Air pollution. ? Chemical smells. ? Things that can cause allergy symptoms (allergens), if you have allergies.  Keep your living space clean. Use products that help remove mold and dust.  Rest as needed. Slowly return to your normal activities.  Take over-the-counter and prescription medicines only as told by your doctor. This includes oxygen therapy and inhaled medicines.  Keep all follow-up visits as told by your doctor. This is important. Contact a doctor if:  Your condition does not get better as soon as expected.  You have a hard time doing your normal activities, even after you rest.  You have new symptoms. Get help right away if:  Your shortness of breath gets worse.  You have trouble breathing when you are resting.  You feel light-headed or you pass out (faint).  You have a cough that is not helped by medicines.  You cough up blood.  You have pain with breathing.  You have pain in your chest, arms, shoulders, or belly (abdomen).  You have a fever.  You cannot walk up stairs.  You cannot exercise the way you normally do. These symptoms may represent a serious problem that is an emergency. Do not wait to see if the symptoms will go away. Get medical help right away. Call your local emergency services (911 in the [place] Do not drive yourself to the  hospital. Summary  Shortness of breath is when you have trouble breathing enough air. It can be a sign of a medical problem.  Avoid things that make it hard for you to breathe, such as smoking, pollution, mold, and dust.  Watch for any changes in your symptoms. Contact your doctor if you do not get better or you get worse. This information is not intended to replace advice given to you by your health care provider. Make sure you discuss any questions you have with your health care provider. Document Revised: 11/02/2017 Document Reviewed: 11/02/2017 Elsevier Patient Education  [place].

## 2019-08-08 NOTE — Progress Notes (Signed)
Patient ID: Paul Pruitt, male    DOB: [birth year], 54 y.o.   MRN: [id number]  PCP: Einar Pheasant, MD  No chief complaint on file.  Subjective:  HPI Paul Pruitt is a 54 y.o. male presents to [facility] for evaluation of shortness of breath related to recent diagnosis of COVID-19.    Patient diagnosed with COVID-19 on 08/03/2019.  Patient has no high risk underlying conditions that increased risk of complications related to COVID-19.  He endorses shortness of breath as he is a very active runner and is now experiencing increased work of breathing with activity such as ambulating up and down stairs and short walks with the dog's.  He has a mild cough although intermittent.  Denies wheezing.  He endorses some chest heaviness had one episode of chest pain upon awakening last night although this has not recurred.  He has no history of any underlying heart disease.  Blood pressure is slightly elevated on today however he has no underlying diagnosis of hypertension.  He denies dizziness.  Endorses good oral intake of food and drink.  He is afebrile and denies nausea, vomiting, or diarrhea.  He is currently taking supplements of vitamin D, vitamin C, and zinc.  He has had a prior history of a vitamin D deficiency.   Review of Systems Pertinent negatives listed in HPI  Patient Active Problem List   Diagnosis Date Noted  . Healthcare maintenance 01/30/2019  . Chronic left lumbar radiculopathy 01/06/2018  . Thigh pain 11/09/2017  . Headache 07/06/2017  . History of cataract surgery 07/06/2017  . Family history of colon cancer 07/06/2017  . BPH with obstruction/lower urinary tract symptoms 12/21/2014  . Vitamin D deficiency 11/21/2014  . Hx of colonic polyp 11/21/2014  . Encounter for screening for malignant neoplasm of prostate 10/17/2013  . Acid reflux 02/15/2002      Prior to Admission medications   Medication Sig Start Date End Date Taking? Authorizing Provider  Ascorbic  Acid (VITAMIN C PO) Take by mouth.   Yes [provider]  ibuprofen (ADVIL) 800 MG tablet Take 1 tablet (800 mg total) by mouth 2 (two) times daily as needed for moderate pain. 05/20/19  Yes Einar Pheasant, MD  Multiple Vitamins-Minerals (ZINC PO) Take by mouth.   Yes [provider]  VITAMIN D PO Take by mouth.   Yes [provider]    Past Medical, Surgical Family and Social History reviewed and updated.    Objective:   Today's Vitals   08/08/19 1847  BP: (!) 146/84  Pulse: (!) 58  Temp: 98.6 F (37 C)  TempSrc: Oral  SpO2: 98%  Weight: 201 lb (91.2 kg)  Height: '6\' 4"'$  (1.93 m)    Wt Readings from Last 3 Encounters:  08/08/19 201 lb (91.2 kg)  05/12/19 200 lb (90.7 kg)  01/28/19 203 lb 6.4 oz (92.3 kg)     Physical Exam Constitutional:      Appearance: Normal appearance. He is normal weight. He is not ill-appearing.  HENT:     Head: Normocephalic.     Nose: Nose normal.  Cardiovascular:     Rate and Rhythm: Bradycardia present.  Pulmonary:     Effort: Pulmonary effort is normal.     Breath sounds: No wheezing, rhonchi or rales.     Comments: Diminished movement of air in upper bronchioles Musculoskeletal:        General: Normal range of motion.  Skin:    General: Skin is  warm.  Neurological:     General: No focal deficit present.     Mental Status: He is oriented to person, place, and time.  Psychiatric:        Mood and Affect: Mood normal.        Behavior: Behavior normal.       Assessment & Plan:  1. COVID-19 virus infection - CBC with Differential/Platelet, evaluate for leukocytosis, hemoglobin, platelet count - Comp Met (CMET), evaluate renal and liver.  Check electrolyte status - albuterol (VENTOLIN HFA) 108 (90 Base) MCG/ACT inhaler 2 puff  2. Shortness of breath Secondary to COVID-19 infection. Patient has no underlying lung disease, he is not obese, and is traditionally physically active. Chest x-ray today was  negative for pneumonia or any visible bronchial changes. We will start patient on a course of prednisone 50 mg x 5 days. - methylPREDNISolone sodium succinate (SOLU-MEDROL) 125 mg/2 mL injection 62 mg  - albuterol (VENTOLIN HFA) 108 (90 Base) MCG/ACT inhaler 2 puff as needed every 4-6 hours shortness of breath and/or chest tightness  3. Respiratory infection, secondary to COVID-19 -Start azithromycin.  Take as directed.    Meds ordered this encounter  Medications  . methylPREDNISolone sodium succinate (SOLU-MEDROL) 125 mg/2 mL injection 62 mg  . predniSONE (DELTASONE) 50 MG tablet    Sig: Take 1 tablet (50 mg total) by mouth daily with breakfast.    Dispense:  5 tablet    Refill:  0  . azithromycin (ZITHROMAX) 250 MG tablet    Sig: Start Azithromycin Take 2 tabs x 1 dose, then 1 tab every day for x 4 days    Dispense:  6 tablet    Refill:  0  . albuterol (VENTOLIN HFA) 108 (90 Base) MCG/ACT inhaler 2 puff     -The patient was given clear instructions to go to ER or return to medical center if symptoms do not improve, worsen or new problems develop. The patient verbalized understanding.     Molli Barrows, FNP-C [facility], PRN Provider  [facility]. [place] [place] [facility] Phone: [phone] Clinic Fax: [phone] Clinic Hours: 5:30 pm -7:30 pm (Monday-Friday)

## 2019-08-08 NOTE — Patient Instructions (Signed)
Shortness of Breath, Adult Shortness of breath means you have trouble breathing. Shortness of breath could be a sign of a medical problem. Follow these instructions at home:   Watch for any changes in your symptoms.  Do not use any products that contain nicotine or tobacco, such as cigarettes, e-cigarettes, and chewing tobacco.  Do not smoke. Smoking can cause shortness of breath. If you need help to quit smoking, ask your doctor.  Avoid things that can make it harder to breathe, such as: ? Mold. ? Dust. ? Air pollution. ? Chemical smells. ? Things that can cause allergy symptoms (allergens), if you have allergies.  Keep your living space clean. Use products that help remove mold and dust.  Rest as needed. Slowly return to your normal activities.  Take over-the-counter and prescription medicines only as told by your doctor. This includes oxygen therapy and inhaled medicines.  Keep all follow-up visits as told by your doctor. This is important. Contact a doctor if:  Your condition does not get better as soon as expected.  You have a hard time doing your normal activities, even after you rest.  You have new symptoms. Get help right away if:  Your shortness of breath gets worse.  You have trouble breathing when you are resting.  You feel light-headed or you pass out (faint).  You have a cough that is not helped by medicines.  You cough up blood.  You have pain with breathing.  You have pain in your chest, arms, shoulders, or belly (abdomen).  You have a fever.  You cannot walk up stairs.  You cannot exercise the way you normally do. These symptoms may represent a serious problem that is an emergency. Do not wait to see if the symptoms will go away. Get medical help right away. Call your local emergency services (911 in the [place] Do not drive yourself to the hospital. Summary  Shortness of breath is when you have trouble breathing enough air. It can be a sign of a  medical problem.  Avoid things that make it hard for you to breathe, such as smoking, pollution, mold, and dust.  Watch for any changes in your symptoms. Contact your doctor if you do not get better or you get worse. This information is not intended to replace advice given to you by your health care provider. Make sure you discuss any questions you have with your health care provider. Document Revised: 11/02/2017 Document Reviewed: 11/02/2017 Elsevier Patient Education  2020 Elsevier Inc.  

## 2019-08-08 NOTE — Telephone Encounter (Signed)
Pt wife called stating that pt tested positive for Covid on 2/17 and is having some SOB and is requesting an appt with Dr. Nicki Reaper

## 2019-08-08 NOTE — Telephone Encounter (Signed)
Patient had appt with [employer] employee wellness this PM. Paul Pruitt called and I have added pt to the respiratory clinic schedule this PM at 6:30.

## 2019-08-09 ENCOUNTER — Ambulatory Visit: Payer: Self-pay | Admitting: Medical

## 2019-08-09 LAB — CBC WITH DIFFERENTIAL/PLATELET
Basophils Absolute: 0 10*3/uL (ref 0.0–0.2)
Basos: 1 %
EOS (ABSOLUTE): 0.1 10*3/uL (ref 0.0–0.4)
Eos: 2 %
Hematocrit: 42.1 % (ref 37.5–51.0)
Hemoglobin: 14.7 g/dL (ref 13.0–17.7)
Immature Grans (Abs): 0 10*3/uL (ref 0.0–0.1)
Immature Granulocytes: 0 %
Lymphocytes Absolute: 1.1 10*3/uL (ref 0.7–3.1)
Lymphs: 33 %
MCH: 30.4 pg (ref 26.6–33.0)
MCHC: 34.9 g/dL (ref 31.5–35.7)
MCV: 87 fL (ref 79–97)
Monocytes Absolute: 0.3 10*3/uL (ref 0.1–0.9)
Monocytes: 10 %
Neutrophils Absolute: 1.8 10*3/uL (ref 1.4–7.0)
Neutrophils: 54 %
Platelets: 139 10*3/uL — ABNORMAL LOW (ref 150–450)
RBC: 4.83 x10E6/uL (ref 4.14–5.80)
RDW: 11.5 % — ABNORMAL LOW (ref 11.6–15.4)
WBC: 3.3 10*3/uL — ABNORMAL LOW (ref 3.4–10.8)

## 2019-08-09 LAB — COMPREHENSIVE METABOLIC PANEL
ALT: 15 IU/L (ref 0–44)
AST: 21 IU/L (ref 0–40)
Albumin/Globulin Ratio: 1.7 (ref 1.2–2.2)
Albumin: 3.9 g/dL (ref 3.8–4.9)
Alkaline Phosphatase: 71 IU/L (ref 39–117)
BUN/Creatinine Ratio: 24 — ABNORMAL HIGH (ref 9–20)
BUN: 26 mg/dL — ABNORMAL HIGH (ref 6–24)
Bilirubin Total: 0.5 mg/dL (ref 0.0–1.2)
CO2: 26 mmol/L (ref 20–29)
Calcium: 8.4 mg/dL — ABNORMAL LOW (ref 8.7–10.2)
Chloride: 108 mmol/L — ABNORMAL HIGH (ref 96–106)
Creatinine, Ser: 1.09 mg/dL (ref 0.76–1.27)
GFR calc Af Amer: 90 mL/min/{1.73_m2} (ref >59)
GFR calc non Af Amer: 78 mL/min/{1.73_m2} (ref >59)
Globulin, Total: 2.3 g/dL (ref 1.5–4.5)
Glucose: 114 mg/dL — ABNORMAL HIGH (ref 65–99)
Potassium: 3.7 mmol/L (ref 3.5–5.2)
Sodium: 145 mmol/L — ABNORMAL HIGH (ref 134–144)
Total Protein: 6.2 g/dL (ref 6.0–8.5)

## 2019-08-09 NOTE — Telephone Encounter (Signed)
Noted.  Please f/u to confirm pt doing ok.  Thanks

## 2019-08-09 NOTE — Progress Notes (Signed)
See other phone message. Pt being seen at respiratory clinic.  To f/u.

## 2019-08-09 NOTE — Telephone Encounter (Signed)
Patient stated that he is doing okay. Will let us know if he needs anything. Patient stated that wife and son were tested yesterday. Waiting on results.

## 2019-08-11 ENCOUNTER — Other Ambulatory Visit: Payer: Self-pay | Admitting: Internal Medicine

## 2019-08-11 DIAGNOSIS — D72819 Decreased white blood cell count, unspecified: Secondary | ICD-10-CM

## 2019-08-11 DIAGNOSIS — D696 Thrombocytopenia, unspecified: Secondary | ICD-10-CM

## 2019-08-11 DIAGNOSIS — R0600 Dyspnea, unspecified: Secondary | ICD-10-CM | POA: Diagnosis not present

## 2019-08-11 DIAGNOSIS — Z7189 Other specified counseling: Secondary | ICD-10-CM | POA: Diagnosis not present

## 2019-08-11 NOTE — Progress Notes (Signed)
Order placed for labs - [facility]

## 2019-08-12 ENCOUNTER — Other Ambulatory Visit: Payer: Self-pay

## 2019-08-12 ENCOUNTER — Ambulatory Visit (INDEPENDENT_AMBULATORY_CARE_PROVIDER_SITE_OTHER): Payer: BC Managed Care – PPO | Admitting: Family Medicine

## 2019-08-12 VITALS — BP 126/82 | HR 76 | Resp 16 | Wt 202.4 lb

## 2019-08-12 DIAGNOSIS — U071 COVID-19: Secondary | ICD-10-CM | POA: Diagnosis not present

## 2019-08-12 DIAGNOSIS — R0602 Shortness of breath: Secondary | ICD-10-CM

## 2019-08-12 DIAGNOSIS — J988 Other specified respiratory disorders: Secondary | ICD-10-CM

## 2019-08-12 MED ORDER — CEFDINIR 300 MG PO CAPS: 600.0000 mg | ORAL_CAPSULE | Freq: Every day | ORAL | 0 refills | Status: DC

## 2019-08-12 MED ORDER — PREDNISONE 20 MG PO TABS: 20.0000 mg | ORAL_TABLET | Freq: Every day | ORAL | 0 refills | Status: AC

## 2019-08-12 NOTE — Patient Instructions (Signed)
Start Cefdinir 600 mg once daily for 10 days. Continue prednisone 20 mg once daily with breakfast.  Recommend using 2 puff Albuterol inhaler at least twice daily 30 minutes prior to exertional activities.     Shortness of Breath, Adult Shortness of breath means you have trouble breathing. Shortness of breath could be a sign of a medical problem. Follow these instructions at home:   Watch for any changes in your symptoms.  Do not use any products that contain nicotine or tobacco, such as cigarettes, e-cigarettes, and chewing tobacco.  Do not smoke. Smoking can cause shortness of breath. If you need help to quit smoking, ask your doctor.  Avoid things that can make it harder to breathe, such as: ? Mold. ? Dust. ? Air pollution. ? Chemical smells. ? Things that can cause allergy symptoms (allergens), if you have allergies.  Keep your living space clean. Use products that help remove mold and dust.  Rest as needed. Slowly return to your normal activities.  Take over-the-counter and prescription medicines only as told by your doctor. This includes oxygen therapy and inhaled medicines.  Keep all follow-up visits as told by your doctor. This is important. Contact a doctor if:  Your condition does not get better as soon as expected.  You have a hard time doing your normal activities, even after you rest.  You have new symptoms. Get help right away if:  Your shortness of breath gets worse.  You have trouble breathing when you are resting.  You feel light-headed or you pass out (faint).  You have a cough that is not helped by medicines.  You cough up blood.  You have pain with breathing.  You have pain in your chest, arms, shoulders, or belly (abdomen).  You have a fever.  You cannot walk up stairs.  You cannot exercise the way you normally do. These symptoms may represent a serious problem that is an emergency. Do not wait to see if the symptoms will go away. Get  medical help right away. Call your local emergency services (911 in the [place] Do not drive yourself to the hospital. Summary  Shortness of breath is when you have trouble breathing enough air. It can be a sign of a medical problem.  Avoid things that make it hard for you to breathe, such as smoking, pollution, mold, and dust.  Watch for any changes in your symptoms. Contact your doctor if you do not get better or you get worse. This information is not intended to replace advice given to you by your health care provider. Make sure you discuss any questions you have with your health care provider. Document Revised: 11/02/2017 Document Reviewed: 11/02/2017 Elsevier Patient Education  [employer]. .

## 2019-08-12 NOTE — Progress Notes (Signed)
Patient ID: CADEL OVERMAN, male    DOB: 03/06/1966, 54 y.o.   MRN: [id number]  PCP: Einar Pheasant, MD  No chief complaint on file.   Subjective:  HPI  QAADIR QUAM is a 54 y.o. male presents to [facility] for  Follow-up evaluation of shortness of breath resulting from recent COVID -19 infection. Patient seen 4 days ago here in clinic for evaluation of worsening SOB related to COVID-19 infection. Chest x-ray was negative. CBC revealed mild leukopenia and CMP revealed elevated glucose and mildly elevated sodium and BUN confirming mild dehydration. He was placed on azithromycin and prednisone and today endorses no significant improvement in work of breathing and continues to experiencing mild chest tightness upper chest wall with breathing. Walking up stairs and talking extensively worsens SOB. Denies any swelling or wheezing. He was prescribed an albuterol inhaler and hasn't attempted use since his recent visit. Has a mild non-productive cough which is intermittent. No fever, chills, or diaphoresis. He has a telemedicine visit with pulmonology today that recommended at some point he may benefit from pulmonary function testing. He has no underlying high risk conditions that would increase risk for complications related to COVID-19.  Review of Systems   Patient Active Problem List   Diagnosis Date Noted  . Thrombocytopenia ([place]) 08/11/2019  . Healthcare maintenance 01/30/2019  . Chronic left lumbar radiculopathy 01/06/2018  . Thigh pain 11/09/2017  . Headache 07/06/2017  . History of cataract surgery 07/06/2017  . Family history of colon cancer 07/06/2017  . BPH with obstruction/lower urinary tract symptoms 12/21/2014  . Vitamin D deficiency 11/21/2014  . Hx of colonic polyp 11/21/2014  . Encounter for screening for malignant neoplasm of prostate 10/17/2013  . Acid reflux 02/15/2002      Prior to Admission medications   Medication Sig Start Date End Date Taking?  Authorizing Provider  Ascorbic Acid (VITAMIN C PO) Take by mouth.    [provider]  azithromycin (ZITHROMAX) 250 MG tablet Start Azithromycin Take 2 tabs x 1 dose, then 1 tab every day for x 4 days 08/08/19   Scot Jun, FNP  cefdinir (OMNICEF) 300 MG capsule Take 2 capsules (600 mg total) by mouth daily. 08/12/19   Scot Jun, FNP  ibuprofen (ADVIL) 800 MG tablet Take 1 tablet (800 mg total) by mouth 2 (two) times daily as needed for moderate pain. 05/20/19   Einar Pheasant, MD  Multiple Vitamins-Minerals (ZINC PO) Take by mouth.    [provider]  predniSONE (DELTASONE) 20 MG tablet Take 1 tablet (20 mg total) by mouth daily with breakfast for 5 days. 08/12/19 08/17/19  Scot Jun, FNP  VITAMIN D PO Take by mouth.    [provider]    Past Medical, Surgical Family and Social History reviewed and updated.    Objective:  There were no vitals filed for this visit.  Wt Readings from Last 3 Encounters:  08/08/19 201 lb (91.2 kg)  05/12/19 200 lb (90.7 kg)  01/28/19 203 lb 6.4 oz (92.3 kg)     Physical Exam Constitutional:      Appearance: He is normal weight. He is not ill-appearing or diaphoretic.  Cardiovascular:     Rate and Rhythm: Normal rate and regular rhythm.     Pulses: Normal pulses.  Pulmonary:     Breath sounds: Normal air entry. Examination of the right-upper field reveals rhonchi. Examination of the left-upper field reveals rhonchi. Rhonchi present. No wheezing.  Skin:  General: Skin is warm and dry.  Neurological:     Mental Status: He is alert and oriented to person, place, and time.  Psychiatric:        Mood and Affect: Mood and affect normal.      Assessment & Plan:  1. COVID-19 virus infection -May d/c quarantine on 08/15/19, per CDC guidelines. Patient no longer febrile and not actively coughing. Continue mask wearing.  2. Shortness of breath -High encouraged use of Albuterol inhaler 2 puffs every 4-6  hours as needed -Will prescribed short course, lower dose of prednisone  5 days -Recommend repeat Chest X-ray if symptoms worsen or do not improve.  3. Respiratory Infection -Tx failure with azithromycin. -Cefdinir 600 mg once daily x 10 days    Meds ordered this encounter  Medications  . predniSONE (DELTASONE) 20 MG tablet    Sig: Take 1 tablet (20 mg total) by mouth daily with breakfast for 5 days.    Dispense:  5 tablet    Refill:  0  . cefdinir (OMNICEF) 300 MG capsule    Sig: Take 2 capsules (600 mg total) by mouth daily.    Dispense:  20 capsule    Refill:  0     -The patient was given clear instructions to go to ER or return to medical center if symptoms do not improve, worsen or new problems develop. The patient verbalized understanding.     Molli Barrows, FNP-C [facility], PRN Provider  [facility]. [place], [place] Phone: [phone] Clinic Fax: [phone] Clinic Hours: 5:30 pm -7:30 pm (Monday-Friday)

## 2019-08-14 ENCOUNTER — Encounter: Payer: Self-pay | Admitting: Family Medicine

## 2019-10-30 DIAGNOSIS — R079 Chest pain, unspecified: Secondary | ICD-10-CM | POA: Diagnosis not present

## 2019-10-30 DIAGNOSIS — S20212A Contusion of left front wall of thorax, initial encounter: Secondary | ICD-10-CM | POA: Diagnosis not present

## 2019-11-27 DIAGNOSIS — T63451A Toxic effect of venom of hornets, accidental (unintentional), initial encounter: Secondary | ICD-10-CM | POA: Diagnosis not present

## 2019-11-27 DIAGNOSIS — S60561A Insect bite (nonvenomous) of right hand, initial encounter: Secondary | ICD-10-CM | POA: Diagnosis not present

## 2019-11-27 DIAGNOSIS — S60562A Insect bite (nonvenomous) of left hand, initial encounter: Secondary | ICD-10-CM | POA: Diagnosis not present

## 2019-11-30 DIAGNOSIS — S20212A Contusion of left front wall of thorax, initial encounter: Secondary | ICD-10-CM | POA: Insufficient documentation

## 2020-02-10 ENCOUNTER — Other Ambulatory Visit: Payer: Self-pay

## 2020-02-10 ENCOUNTER — Ambulatory Visit (INDEPENDENT_AMBULATORY_CARE_PROVIDER_SITE_OTHER): Payer: BC Managed Care – PPO | Admitting: Internal Medicine

## 2020-02-10 ENCOUNTER — Encounter: Payer: Self-pay | Admitting: Internal Medicine

## 2020-02-10 VITALS — BP 110/70 | HR 58 | Temp 97.8°F | Resp 16 | Ht 76.0 in | Wt 198.0 lb

## 2020-02-10 DIAGNOSIS — Z0000 Encounter for general adult medical examination without abnormal findings: Secondary | ICD-10-CM

## 2020-02-10 DIAGNOSIS — E559 Vitamin D deficiency, unspecified: Secondary | ICD-10-CM | POA: Diagnosis not present

## 2020-02-10 DIAGNOSIS — Z13220 Encounter for screening for lipoid disorders: Secondary | ICD-10-CM | POA: Diagnosis not present

## 2020-02-10 DIAGNOSIS — Z125 Encounter for screening for malignant neoplasm of prostate: Secondary | ICD-10-CM | POA: Diagnosis not present

## 2020-02-10 DIAGNOSIS — R351 Nocturia: Secondary | ICD-10-CM

## 2020-02-10 DIAGNOSIS — R000 Tachycardia, unspecified: Secondary | ICD-10-CM

## 2020-02-10 DIAGNOSIS — Z23 Encounter for immunization: Secondary | ICD-10-CM

## 2020-02-10 DIAGNOSIS — D696 Thrombocytopenia, unspecified: Secondary | ICD-10-CM

## 2020-02-10 LAB — CBC WITH DIFFERENTIAL/PLATELET
Basophils Absolute: 0 10*3/uL (ref 0.0–0.1)
Basophils Relative: 1.2 % (ref 0.0–3.0)
Eosinophils Absolute: 0.1 10*3/uL (ref 0.0–0.7)
Eosinophils Relative: 1.5 % (ref 0.0–5.0)
HCT: 42.8 % (ref 39.0–52.0)
Hemoglobin: 14.6 g/dL (ref 13.0–17.0)
Lymphocytes Relative: 30.4 % (ref 12.0–46.0)
Lymphs Abs: 1.2 10*3/uL (ref 0.7–4.0)
MCHC: 34.1 g/dL (ref 30.0–36.0)
MCV: 89.1 fl (ref 78.0–100.0)
Monocytes Absolute: 0.3 10*3/uL (ref 0.1–1.0)
Monocytes Relative: 8.3 % (ref 3.0–12.0)
Neutro Abs: 2.3 10*3/uL (ref 1.4–7.7)
Neutrophils Relative %: 58.6 % (ref 43.0–77.0)
Platelets: 183 10*3/uL (ref 150.0–400.0)
RBC: 4.8 Mil/uL (ref 4.22–5.81)
RDW: 13 % (ref 11.5–15.5)
WBC: 3.9 10*3/uL — ABNORMAL LOW (ref 4.0–10.5)

## 2020-02-10 LAB — URINALYSIS, ROUTINE W REFLEX MICROSCOPIC
Hgb urine dipstick: NEGATIVE
Ketones, ur: NEGATIVE
Leukocytes,Ua: NEGATIVE
Nitrite: NEGATIVE
RBC / HPF: NONE SEEN (ref 0–?)
Specific Gravity, Urine: >=1.03 — AB (ref 1.000–1.030)
Total Protein, Urine: NEGATIVE
Urine Glucose: NEGATIVE
Urobilinogen, UA: 0.2 (ref 0.0–1.0)
pH: 5.5 (ref 5.0–8.0)

## 2020-02-10 LAB — LIPID PANEL
Cholesterol: 154 mg/dL (ref 0–200)
HDL: 65.4 mg/dL (ref >39.00)
LDL Cholesterol: 80 mg/dL (ref 0–99)
NonHDL: 88.39
Total CHOL/HDL Ratio: 2
Triglycerides: 43 mg/dL (ref 0.0–149.0)
VLDL: 8.6 mg/dL (ref 0.0–40.0)

## 2020-02-10 LAB — VITAMIN D 25 HYDROXY (VIT D DEFICIENCY, FRACTURES): VITD: 20.75 ng/mL — ABNORMAL LOW (ref 30.00–100.00)

## 2020-02-10 LAB — COMPREHENSIVE METABOLIC PANEL
ALT: 23 U/L (ref 0–53)
AST: 22 U/L (ref 0–37)
Albumin: 4.4 g/dL (ref 3.5–5.2)
Alkaline Phosphatase: 78 U/L (ref 39–117)
BUN: 30 mg/dL — ABNORMAL HIGH (ref 6–23)
CO2: 30 mEq/L (ref 19–32)
Calcium: 9.2 mg/dL (ref 8.4–10.5)
Chloride: 104 mEq/L (ref 96–112)
Creatinine, Ser: 1.06 mg/dL (ref 0.40–1.50)
GFR: 73.1 mL/min (ref >60.00)
Glucose, Bld: 87 mg/dL (ref 70–99)
Potassium: 3.7 mEq/L (ref 3.5–5.1)
Sodium: 140 mEq/L (ref 135–145)
Total Bilirubin: 1.3 mg/dL — ABNORMAL HIGH (ref 0.2–1.2)
Total Protein: 6.5 g/dL (ref 6.0–8.3)

## 2020-02-10 LAB — PSA: PSA: 0.43 ng/mL (ref 0.10–4.00)

## 2020-02-10 LAB — TSH: TSH: 2.05 u[IU]/mL (ref 0.35–4.50)

## 2020-02-10 NOTE — Progress Notes (Signed)
Patient ID: Paul Pruitt, male   DOB: [birth year], 54 y.o.   MRN: [id number]   Subjective:    Patient ID: Paul Pruitt, male    DOB: Apr 19, 1966, 54 y.o.   MRN: [id number]  HPI This visit occurred during the SARS-CoV-2 public health emergency.  Safety protocols were in place, including screening questions prior to the visit, additional usage of staff PPE, and extensive cleaning of exam room while observing appropriate contact time as indicated for disinfecting solutions.  Patient here for his physical exam. He reports he is doing relatively well.  Exercises regularly.  Runs 50-70 miles per month.  No chest pain or sob with increased activity or exertion.  No acid reflux or abdominal pain reported.  No bowel problems.  Has noticed occasional increased heart rate.  Occasional palpitations.  Does not have symptoms with increased activity or exertion.  Discussed further w/up / evaluation.  Wants to monitor.    Past Medical History:  Diagnosis Date   Melanoma [facility])    Past Surgical History:  Procedure Laterality Date   BACK SURGERY N/A 2004, 2005   L3, 4 and L4,5   CATARACT EXTRACTION, BILATERAL     08/12/16 and 08/19/16.   COLONOSCOPY  02/07/2016   By dr Carlisle Beers polyps, internal hemorrhoids, waiting path report   HERNIA REPAIR     LAMINECTOMY     x 2   MELANOMA EXCISION     chest area   TONSILLECTOMY     Family History  Problem Relation Age of Onset   Arthritis Mother    Colon cancer Mother    Hypertension Father    Asthma Father    Hypertension Brother    Obesity Brother    Asthma Son    Obesity Son    Kidney disease Neg Hx    Prostate cancer Neg Hx    Social History   Socioeconomic History   Marital status: Married    Spouse name: Not on file   Number of children: Not on file   Years of education: Not on file   Highest education level: Not on file  Occupational History   Not on file  Tobacco Use   Smoking status: Never Smoker   Smokeless  tobacco: Never Used  Vaping Use   Vaping Use: Never used  Substance and Sexual Activity   Alcohol use: Not Currently    Alcohol/week: 0.0 standard drinks    Comment: seldomly-3 drinks a year maybe   Drug use: No   Sexual activity: Yes    Birth control/protection: None  Other Topics Concern   Not on file  Social History Narrative   Patient states heavy exercise. 7 days per week x 30-90 minutes.   Social Determinants of Health   Financial Resource Strain:    Difficulty of Paying Living Expenses: Not on file  Food Insecurity:    Worried About Charity fundraiser in the Last Year: Not on file   [employer] of Food in the Last Year: Not on file  Transportation Needs:    Lack of Transportation (Medical): Not on file   Lack of Transportation (Non-Medical): Not on file  Physical Activity:    Days of Exercise per Week: Not on file   Minutes of Exercise per Session: Not on file  Stress:    Feeling of Stress : Not on file  Social Connections:    Frequency of Communication with Friends and Family: Not on file   Frequency of Social Gatherings  with Friends and Family: Not on file   Attends Religious Services: Not on file   Active Member of Clubs or Organizations: Not on file   Attends Club or Organization Meetings: Not on file   Marital Status: Not on file    Outpatient Encounter Medications as of 02/10/2020  Medication Sig   Ascorbic Acid (VITAMIN C PO) Take by mouth.   azithromycin (ZITHROMAX) 250 MG tablet Start Azithromycin Take 2 tabs x 1 dose, then 1 tab every day for x 4 days   cefdinir (OMNICEF) 300 MG capsule Take 2 capsules (600 mg total) by mouth daily.   ibuprofen (ADVIL) 800 MG tablet Take 1 tablet (800 mg total) by mouth 2 (two) times daily as needed for moderate pain.   Multiple Vitamins-Minerals (ZINC PO) Take by mouth.   VITAMIN D PO Take by mouth.   Facility-Administered Encounter Medications as of 02/10/2020  Medication   albuterol (VENTOLIN  HFA) 108 (90 Base) MCG/ACT inhaler 2 puff    Review of Systems  Constitutional: Negative for appetite change and unexpected weight change.  HENT: Negative for congestion and sinus pressure.   Eyes: Negative for pain and visual disturbance.  Respiratory: Negative for cough, chest tightness and shortness of breath.   Cardiovascular: Negative for chest pain and leg swelling.       Occasional increased heart rate/palpitations.    Gastrointestinal: Negative for abdominal pain, diarrhea, nausea and vomiting.  Genitourinary: Negative for difficulty urinating and dysuria.  Musculoskeletal: Negative for joint swelling and myalgias.  Skin: Negative for color change and rash.  Neurological: Negative for dizziness, light-headedness and headaches.  Hematological: Negative for adenopathy. Does not bruise/bleed easily.  Psychiatric/Behavioral: Negative for agitation, decreased concentration and dysphoric mood.       Objective:    Physical Exam Vitals reviewed.  Constitutional:      General: He is not in acute distress.    Appearance: Normal appearance. He is well-developed.  HENT:     Head: Normocephalic and atraumatic.     Nose: Nose normal. No congestion.     Mouth/Throat:     Pharynx: No oropharyngeal exudate.  Eyes:     General: No scleral icterus.       Right eye: No discharge.        Left eye: No discharge.     Conjunctiva/sclera: Conjunctivae normal.  Neck:     Thyroid: No thyromegaly.  Cardiovascular:     Rate and Rhythm: Normal rate and regular rhythm.  Pulmonary:     Effort: No respiratory distress.     Breath sounds: Normal breath sounds. No wheezing.  Abdominal:     General: Bowel sounds are normal.     Palpations: Abdomen is soft.     Tenderness: There is no abdominal tenderness.  Musculoskeletal:        General: No swelling or tenderness.     Cervical back: Neck supple. No tenderness.  Lymphadenopathy:     Cervical: No cervical adenopathy.  Skin:    Findings: No  erythema or rash.  Neurological:     Mental Status: He is alert and oriented to person, place, and time.  Psychiatric:        Mood and Affect: Mood normal.        Behavior: Behavior normal.     BP 110/70    Pulse (!) 58    Temp 97.8 F (36.6 C) (Oral)    Resp 16    Ht 6\' 4"  (1.93 m)  Wt 198 lb (89.8 kg)    SpO2 98%    BMI 24.10 kg/m  Wt Readings from Last 3 Encounters:  02/10/20 198 lb (89.8 kg)  08/14/19 202 lb 6.4 oz (91.8 kg)  08/08/19 201 lb (91.2 kg)     Lab Results  Component Value Date   WBC 3.9 (L) 02/10/2020   HGB 14.6 02/10/2020   HCT 42.8 02/10/2020   PLT 183.0 02/10/2020   GLUCOSE 87 02/10/2020   CHOL 154 02/10/2020   TRIG 43.0 02/10/2020   HDL 65.40 02/10/2020   LDLCALC 80 02/10/2020   ALT 23 02/10/2020   AST 22 02/10/2020   NA 140 02/10/2020   K 3.7 02/10/2020   CL 104 02/10/2020   CREATININE 1.06 02/10/2020   BUN 30 (H) 02/10/2020   CO2 30 02/10/2020   TSH 2.05 02/10/2020   PSA 0.43 02/10/2020       Assessment & Plan:   Problem List Items Addressed This Visit    Vitamin D deficiency - Primary    Follow vitamin  D level.       Relevant Orders   VITAMIN D 25 Hydroxy (Vit-D Deficiency, Fractures) (Completed)   Thrombocytopenia (HCC)   Nocturia   Relevant Orders   Urinalysis, Routine w reflex microscopic (Completed)   Increased heart rate    Occasional increased heart rate/palpitations.  Discussed further w/up / evaluation.  He wants to monitor.       Relevant Orders   TSH (Completed)   CBC with Differential/Platelet (Completed)   Comprehensive metabolic panel (Completed)   Healthcare maintenance    Physical today 02/10/20.  Colonoscopy 01/2016.  Recommended f/u colonoscopy in 01/2021.  Check psa.        Other Visit Diagnoses    Prostate cancer screening       Relevant Orders   PSA (Completed)   Screening cholesterol level       Relevant Orders   Lipid panel (Completed)   Need for immunization against influenza       Relevant  Orders   Flu Vaccine QUAD 36+ mos IM (Completed)       Einar Pheasant, MD

## 2020-02-10 NOTE — Assessment & Plan Note (Signed)
Physical today 02/10/20.  Colonoscopy 01/2016.  Recommended f/u colonoscopy in 01/2021.  Check psa.

## 2020-02-12 ENCOUNTER — Other Ambulatory Visit: Payer: Self-pay | Admitting: Internal Medicine

## 2020-02-12 DIAGNOSIS — D72819 Decreased white blood cell count, unspecified: Secondary | ICD-10-CM

## 2020-02-12 NOTE — Progress Notes (Signed)
Orders placed for f/u labs.  

## 2020-02-19 ENCOUNTER — Encounter: Payer: Self-pay | Admitting: Internal Medicine

## 2020-02-20 NOTE — Assessment & Plan Note (Signed)
Occasional increased heart rate/palpitations.  Discussed further w/up / evaluation.  He wants to monitor.

## 2020-02-20 NOTE — Assessment & Plan Note (Signed)
Follow vitamin D level.  

## 2020-03-02 ENCOUNTER — Other Ambulatory Visit (INDEPENDENT_AMBULATORY_CARE_PROVIDER_SITE_OTHER): Payer: BC Managed Care – PPO

## 2020-03-02 ENCOUNTER — Other Ambulatory Visit: Payer: Self-pay

## 2020-03-02 DIAGNOSIS — D72819 Decreased white blood cell count, unspecified: Secondary | ICD-10-CM | POA: Diagnosis not present

## 2020-03-02 LAB — HEPATIC FUNCTION PANEL
ALT: 21 U/L (ref 0–53)
AST: 21 U/L (ref 0–37)
Albumin: 4.3 g/dL (ref 3.5–5.2)
Alkaline Phosphatase: 70 U/L (ref 39–117)
Bilirubin, Direct: 0.2 mg/dL (ref 0.0–0.3)
Total Bilirubin: 1.2 mg/dL (ref 0.2–1.2)
Total Protein: 6.3 g/dL (ref 6.0–8.3)

## 2020-03-02 LAB — CBC WITH DIFFERENTIAL/PLATELET
Basophils Absolute: 0 10*3/uL (ref 0.0–0.1)
Basophils Relative: 1 % (ref 0.0–3.0)
Eosinophils Absolute: 0 10*3/uL (ref 0.0–0.7)
Eosinophils Relative: 0.8 % (ref 0.0–5.0)
HCT: 40.9 % (ref 39.0–52.0)
Hemoglobin: 14.1 g/dL (ref 13.0–17.0)
Lymphocytes Relative: 23.7 % (ref 12.0–46.0)
Lymphs Abs: 1.1 10*3/uL (ref 0.7–4.0)
MCHC: 34.5 g/dL (ref 30.0–36.0)
MCV: 88.9 fl (ref 78.0–100.0)
Monocytes Absolute: 0.4 10*3/uL (ref 0.1–1.0)
Monocytes Relative: 7.8 % (ref 3.0–12.0)
Neutro Abs: 3.1 10*3/uL (ref 1.4–7.7)
Neutrophils Relative %: 66.7 % (ref 43.0–77.0)
Platelets: 159 10*3/uL (ref 150.0–400.0)
RBC: 4.6 Mil/uL (ref 4.22–5.81)
RDW: 12.6 % (ref 11.5–15.5)
WBC: 4.6 10*3/uL (ref 4.0–10.5)

## 2020-06-01 ENCOUNTER — Other Ambulatory Visit: Payer: Self-pay | Admitting: Internal Medicine

## 2020-06-18 ENCOUNTER — Telehealth: Payer: Self-pay | Admitting: Internal Medicine

## 2020-06-18 NOTE — Telephone Encounter (Signed)
Agree with need for evaluation if sob and chest pain.

## 2020-06-18 NOTE — Telephone Encounter (Signed)
Noted  

## 2020-06-18 NOTE — Telephone Encounter (Signed)
Pt's wife called pt is out work but has been complaining about SOB and chest discomfort  And frequent urination at night.. pt wife stated that he is not in distress  Please call pt wife

## 2020-06-18 NOTE — Telephone Encounter (Signed)
Spoke with patients wife. Confirmed the SOB is not new. Pt is not in acute distress according to wife. He is seeing cardiology tomorrow for the chest discomfort. Advised to pts wife that typically he should be evaluated today. Declined but if symptoms worsen or change then he will go to ED. Requested appt with Dr Lorin Picket for the frequent urination. Scheduled for 1/11 at 7:30 AM

## 2020-06-19 DIAGNOSIS — Z23 Encounter for immunization: Secondary | ICD-10-CM | POA: Diagnosis not present

## 2020-06-19 DIAGNOSIS — R079 Chest pain, unspecified: Secondary | ICD-10-CM | POA: Diagnosis not present

## 2020-06-19 DIAGNOSIS — R002 Palpitations: Secondary | ICD-10-CM | POA: Diagnosis not present

## 2020-06-19 DIAGNOSIS — R0602 Shortness of breath: Secondary | ICD-10-CM | POA: Diagnosis not present

## 2020-06-26 ENCOUNTER — Ambulatory Visit (INDEPENDENT_AMBULATORY_CARE_PROVIDER_SITE_OTHER): Payer: BC Managed Care – PPO

## 2020-06-26 ENCOUNTER — Other Ambulatory Visit: Payer: Self-pay

## 2020-06-26 ENCOUNTER — Ambulatory Visit: Payer: BC Managed Care – PPO | Admitting: Internal Medicine

## 2020-06-26 ENCOUNTER — Encounter: Payer: Self-pay | Admitting: Internal Medicine

## 2020-06-26 DIAGNOSIS — R0602 Shortness of breath: Secondary | ICD-10-CM

## 2020-06-26 DIAGNOSIS — D696 Thrombocytopenia, unspecified: Secondary | ICD-10-CM | POA: Diagnosis not present

## 2020-06-26 DIAGNOSIS — R350 Frequency of micturition: Secondary | ICD-10-CM | POA: Diagnosis not present

## 2020-06-26 LAB — BASIC METABOLIC PANEL
BUN: 34 mg/dL — ABNORMAL HIGH (ref 6–23)
CO2: 29 mEq/L (ref 19–32)
Calcium: 9.4 mg/dL (ref 8.4–10.5)
Chloride: 104 mEq/L (ref 96–112)
Creatinine, Ser: 0.95 mg/dL (ref 0.40–1.50)
GFR: 91.51 mL/min (ref >60.00)
Glucose, Bld: 95 mg/dL (ref 70–99)
Potassium: 4.5 mEq/L (ref 3.5–5.1)
Sodium: 139 mEq/L (ref 135–145)

## 2020-06-26 LAB — URINALYSIS, ROUTINE W REFLEX MICROSCOPIC
Bilirubin Urine: NEGATIVE
Hgb urine dipstick: NEGATIVE
Ketones, ur: NEGATIVE
Leukocytes,Ua: NEGATIVE
Nitrite: NEGATIVE
RBC / HPF: NONE SEEN (ref 0–?)
Specific Gravity, Urine: 1.025 (ref 1.000–1.030)
Total Protein, Urine: NEGATIVE
Urine Glucose: NEGATIVE
Urobilinogen, UA: 0.2 (ref 0.0–1.0)
pH: 6.5 (ref 5.0–8.0)

## 2020-06-26 LAB — CBC WITH DIFFERENTIAL/PLATELET
Basophils Absolute: 0 10*3/uL (ref 0.0–0.1)
Basophils Relative: 1.1 % (ref 0.0–3.0)
Eosinophils Absolute: 0.1 10*3/uL (ref 0.0–0.7)
Eosinophils Relative: 1.8 % (ref 0.0–5.0)
HCT: 43.2 % (ref 39.0–52.0)
Hemoglobin: 14.8 g/dL (ref 13.0–17.0)
Lymphocytes Relative: 30.2 % (ref 12.0–46.0)
Lymphs Abs: 1.1 10*3/uL (ref 0.7–4.0)
MCHC: 34.3 g/dL (ref 30.0–36.0)
MCV: 88.7 fl (ref 78.0–100.0)
Monocytes Absolute: 0.3 10*3/uL (ref 0.1–1.0)
Monocytes Relative: 8.8 % (ref 3.0–12.0)
Neutro Abs: 2.2 10*3/uL (ref 1.4–7.7)
Neutrophils Relative %: 58.1 % (ref 43.0–77.0)
Platelets: 171 10*3/uL (ref 150.0–400.0)
RBC: 4.87 Mil/uL (ref 4.22–5.81)
RDW: 12.3 % (ref 11.5–15.5)
WBC: 3.8 10*3/uL — ABNORMAL LOW (ref 4.0–10.5)

## 2020-06-26 LAB — PSA: PSA: 0.35 ng/mL (ref 0.10–4.00)

## 2020-06-26 LAB — TSH: TSH: 2.85 u[IU]/mL (ref 0.35–4.50)

## 2020-06-26 NOTE — Progress Notes (Signed)
Patient ID: Paul Pruitt, male   DOB: 1966-01-01, 55 y.o.   MRN: [id number]   Subjective:    Patient ID: Paul Pruitt, male    DOB: Oct 31, 1965, 55 y.o.   MRN: [id number]  HPI This visit occurred during the SARS-CoV-2 public health emergency.  Safety protocols were in place, including screening questions prior to the visit, additional usage of staff PPE, and extensive cleaning of exam room while observing appropriate contact time as indicated for disinfecting solutions.  Patient here for a work in appt with concerns regarding frequent urination.  Also was seen by cardiology recently for sob.  Notices some sob and heart pounding - worse at night.  Cardiology ordered 72 hour holter, ETT and echo. ECHO and ETT scheduled.  He exercises regularly.  Also reports some increased nocturia.  Does not notice increased frequency as much during the day.  No hematuria.  No dysuria.  No abdominal pain.  Bowels moving.  Discussed possible sleep apnea with nocturia.     Past Medical History:  Diagnosis Date  . Dysplastic nevus 04/09/2015   left post lateral deltoid, mild atypia   . Dysplastic nevus 05/17/2014   right prox med thigh, mild atypia   . Dysplastic nevus 08/22/2008   right post shoulder, slight atypia   . Dysplastic nevus 01/04/2007   R sup post lat thigh, slight atypia   . Dysplastic nevus 09/15/2006   left post waistline paraspinal, severe atypia   . Dysplastic nevus 07/09/2006   Left lower back, slight atypia   . Dysplastic nevus 07/09/2006   right ear, moderate atypia   . Melanoma [facility])    Past Surgical History:  Procedure Laterality Date  . BACK SURGERY N/A 2004, 2005   L3, 4 and L4,5  . CATARACT EXTRACTION, BILATERAL     08/12/16 and 08/19/16.  Marland Kitchen COLONOSCOPY  02/07/2016   By dr Carlisle Beers polyps, internal hemorrhoids, waiting path report  . HERNIA REPAIR    . LAMINECTOMY     x 2  . MELANOMA EXCISION     chest area  . TONSILLECTOMY     Family History  Problem Relation Age of  Onset  . Arthritis Mother   . Colon cancer Mother   . Hypertension Father   . Asthma Father   . Hypertension Brother   . Obesity Brother   . Asthma Son   . Obesity Son   . Kidney disease Neg Hx   . Prostate cancer Neg Hx    Social History   Socioeconomic History  . Marital status: Married    Spouse name: Not on file  . Number of children: Not on file  . Years of education: Not on file  . Highest education level: Not on file  Occupational History  . Not on file  Tobacco Use  . Smoking status: Never Smoker  . Smokeless tobacco: Never Used  Vaping Use  . Vaping Use: Never used  Substance and Sexual Activity  . Alcohol use: Not Currently    Alcohol/week: 0.0 standard drinks    Comment: seldomly-3 drinks a year maybe  . Drug use: No  . Sexual activity: Yes    Birth control/protection: None  Other Topics Concern  . Not on file  Social History Narrative   Patient states heavy exercise. 7 days per week x 30-90 minutes.   Social Determinants of Health   Financial Resource Strain: Not on file  Food Insecurity: Not on file  Transportation Needs: Not on file  Physical Activity: Not on file  Stress: Not on file  Social Connections: Not on file    Outpatient Encounter Medications as of 06/26/2020  Medication Sig  . ibuprofen (ADVIL) 800 MG tablet TAKE 1 TABLET (800 MG TOTAL) BY MOUTH 2 (TWO) TIMES DAILY AS NEEDED FOR MODERATE PAIN.  Marland Kitchen VITAMIN D PO Take by mouth.  . [DISCONTINUED] Multiple Vitamins-Minerals (ZINC PO) Take by mouth.   Facility-Administered Encounter Medications as of 06/26/2020  Medication  . albuterol (VENTOLIN HFA) 108 (90 Base) MCG/ACT inhaler 2 puff   . Review of Systems  Constitutional: Negative for appetite change and unexpected weight change.  HENT: Negative for congestion and sinus pressure.   Respiratory: Negative for cough and chest tightness.        SOB as outlined.    Cardiovascular: Negative for chest pain, palpitations and leg swelling.   Gastrointestinal: Negative for abdominal pain, diarrhea, nausea and vomiting.  Genitourinary: Positive for frequency. Negative for difficulty urinating and dysuria.  Musculoskeletal: Negative for joint swelling and myalgias.  Skin: Negative for color change and rash.  Neurological: Negative for dizziness, light-headedness and headaches.  Psychiatric/Behavioral: Negative for agitation and dysphoric mood.       Objective:    Physical Exam Vitals reviewed.  Constitutional:      General: He is not in acute distress.    Appearance: Normal appearance. He is well-developed and well-nourished.  HENT:     Head: Normocephalic and atraumatic.     Right Ear: External ear normal.     Left Ear: External ear normal.  Eyes:     General: No scleral icterus.       Right eye: No discharge.        Left eye: No discharge.     Conjunctiva/sclera: Conjunctivae normal.  Cardiovascular:     Rate and Rhythm: Normal rate and regular rhythm.  Pulmonary:     Effort: Pulmonary effort is normal. No respiratory distress.     Breath sounds: Normal breath sounds.  Abdominal:     General: Bowel sounds are normal.     Palpations: Abdomen is soft.     Tenderness: There is no abdominal tenderness.  Musculoskeletal:        General: No swelling, tenderness or edema.     Cervical back: Neck supple. No rigidity or tenderness.  Skin:    Findings: No erythema or rash.  Neurological:     Mental Status: He is alert.  Psychiatric:        Mood and Affect: Mood and affect and mood normal.        Behavior: Behavior normal.     BP 112/76   Pulse 62   Temp 97.6 F (36.4 C) (Oral)   Resp 16   Ht 6\' 4"  (1.93 m)   Wt 203 lb (92.1 kg)   SpO2 97%   BMI 24.71 kg/m  Wt Readings from Last 3 Encounters:  06/26/20 203 lb (92.1 kg)  02/10/20 198 lb (89.8 kg)  08/14/19 202 lb 6.4 oz (91.8 kg)     Lab Results  Component Value Date   WBC 3.8 (L) 06/26/2020   HGB 14.8 06/26/2020   HCT 43.2 06/26/2020   PLT 171.0  06/26/2020   GLUCOSE 95 06/26/2020   CHOL 154 02/10/2020   TRIG 43.0 02/10/2020   HDL 65.40 02/10/2020   LDLCALC 80 02/10/2020   ALT 21 03/02/2020   AST 21 03/02/2020   NA 139 06/26/2020   K 4.5 06/26/2020   CL  104 06/26/2020   CREATININE 0.95 06/26/2020   BUN 34 (H) 06/26/2020   CO2 29 06/26/2020   TSH 2.85 06/26/2020   PSA 0.35 06/26/2020       Assessment & Plan:   Problem List Items Addressed This Visit    SOB (shortness of breath)    SOB noted as outlined.  Has seen cardiology.  Had holter.  Planning for ETT and ECHO.  Check cxr.  Discussed possible sleep apnea.  Defers sleep study at this time. Pursue above w/up.  Follow.       Relevant Orders   CBC with Differential/Platelet (Completed)   TSH (Completed)   DG Chest 2 View (Completed)   Thrombocytopenia (HCC)    Will check cbc today to confirm wnl.       Urinary frequency    Discussed possible etiologies.  Discussed BPH, incomplete bladder emptying, other metabolic etiologies and the possibility of sleep apnea.  Will check urine.  Also check psa and metabolic panel.  May need urology referral - check post void residual, etc.       Relevant Orders   PSA (Completed)   Basic metabolic panel (Completed)   Urinalysis, Routine w reflex microscopic (Completed)   Urine Culture (Completed)      I spent 25 minutes with the patient and more than 50% of the time was spent in consultation regarding the above.  Time spent discussing current symptoms. Time also spent discussing plans for further evaluation and treatment.   Einar Pheasant, MD

## 2020-06-27 LAB — URINE CULTURE
MICRO NUMBER:: 11404767
Result:: NO GROWTH
SPECIMEN QUALITY:: ADEQUATE

## 2020-07-01 ENCOUNTER — Encounter: Payer: Self-pay | Admitting: Internal Medicine

## 2020-07-01 NOTE — Assessment & Plan Note (Signed)
Discussed possible etiologies.  Discussed BPH, incomplete bladder emptying, other metabolic etiologies and the possibility of sleep apnea.  Will check urine.  Also check psa and metabolic panel.  May need urology referral - check post void residual, etc.

## 2020-07-01 NOTE — Assessment & Plan Note (Signed)
Will check cbc today to confirm wnl.  ?

## 2020-07-01 NOTE — Assessment & Plan Note (Signed)
SOB noted as outlined.  Has seen cardiology.  Had holter.  Planning for ETT and ECHO.  Check cxr.  Discussed possible sleep apnea.  Defers sleep study at this time. Pursue above w/up.  Follow.

## 2020-07-04 IMAGING — CT CT PELVIS W/O CM
2 of 3 series · 17 of 46 positions shown, 19 images · non-contrast
Comparison: None.

CLINICAL DATA: Severe buttock pain

EXAM:
CT PELVIS WITHOUT CONTRAST
TECHNIQUE: Multidetector CT imaging of the pelvis was performed following the
standard protocol without intravenous contrast.

[Series 3: axial st · axial · 0.92mm/px · z∈[-377,-133]mm · 14 of 142 slices shown, 16 images]
[im 10/142  soft-tissue]
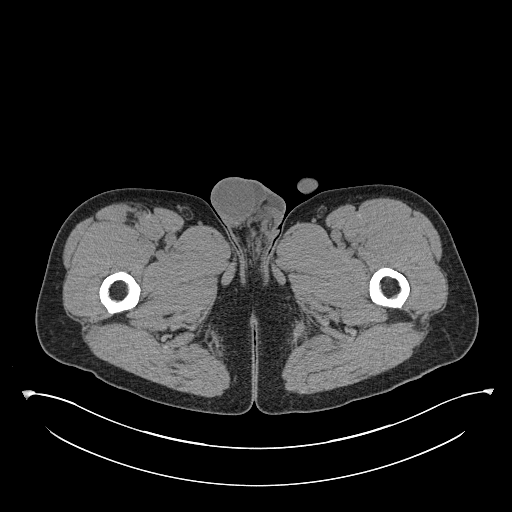
[im 10/142  bone]
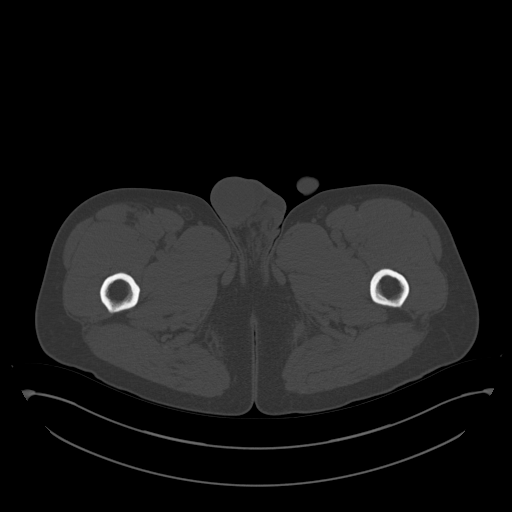
[im 19/142  soft-tissue]
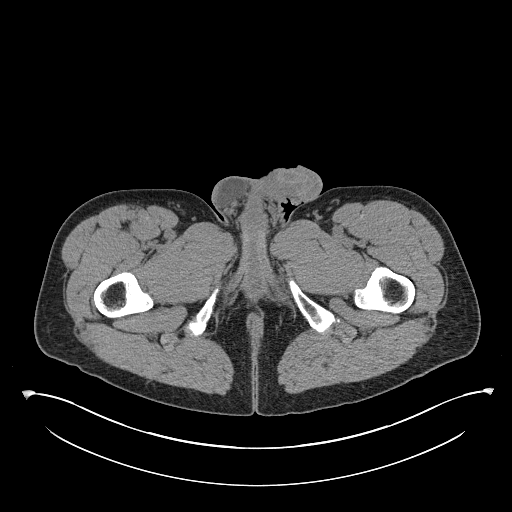
[im 28/142  soft-tissue]
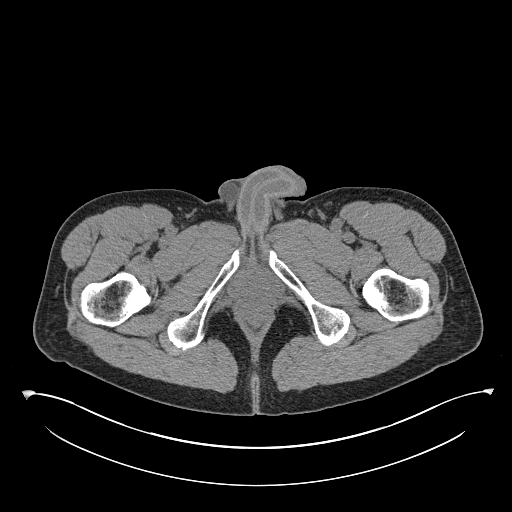
[im 37/142  soft-tissue]
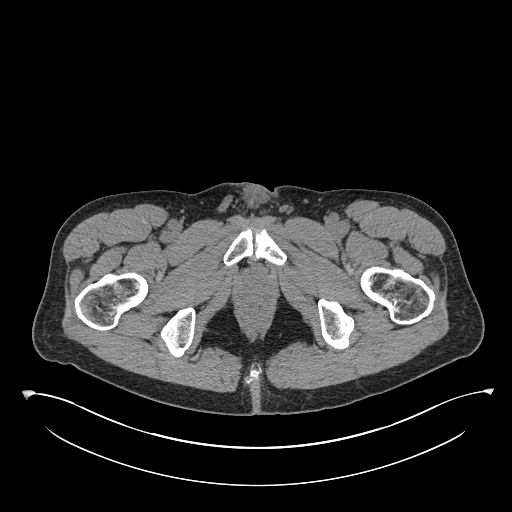
[im 46/142  soft-tissue]
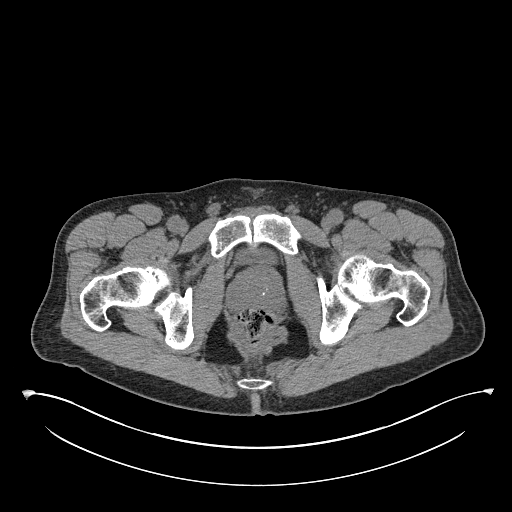
[im 55/142  soft-tissue]
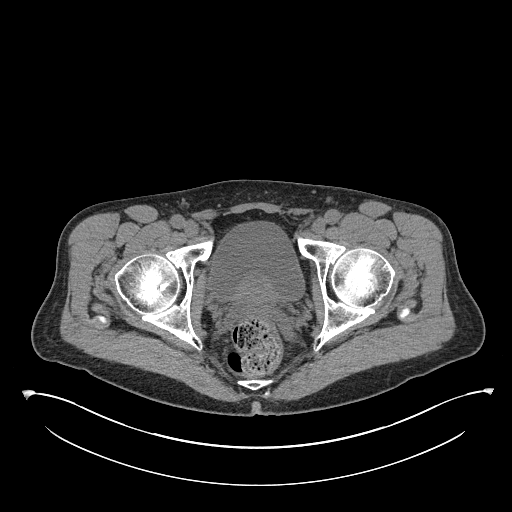
[im 64/142  soft-tissue]
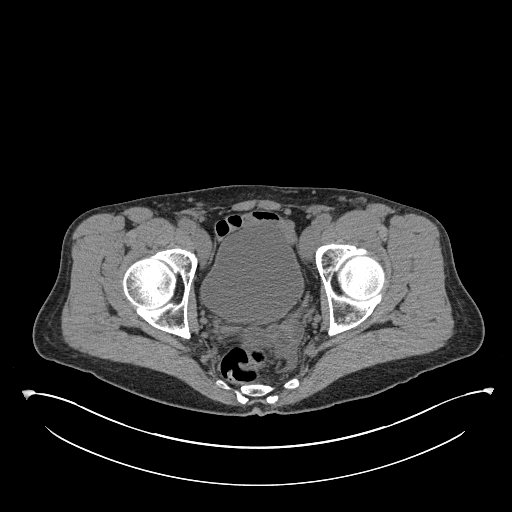
[im 78/142  soft-tissue]
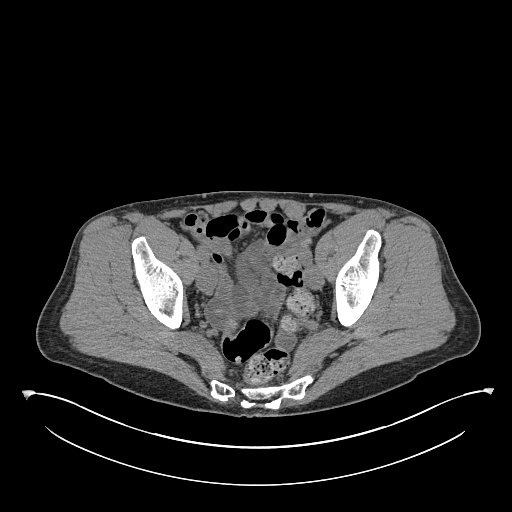
[im 87/142  soft-tissue]
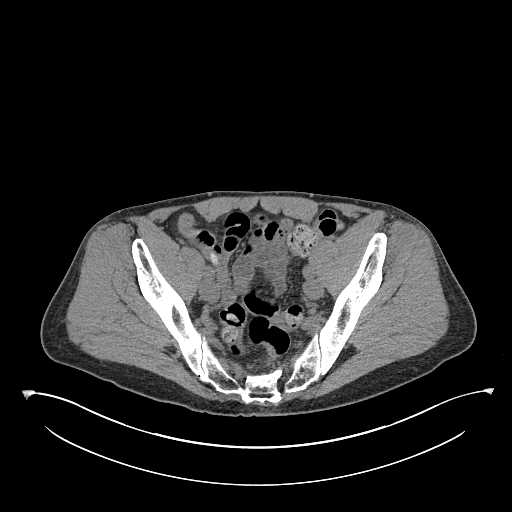
[im 87/142  bone]
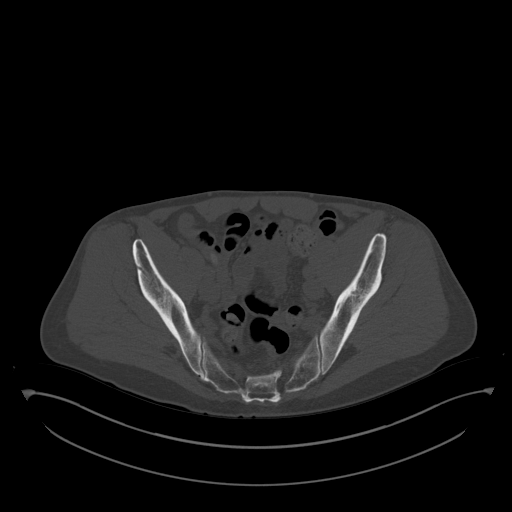
[im 96/142  soft-tissue]
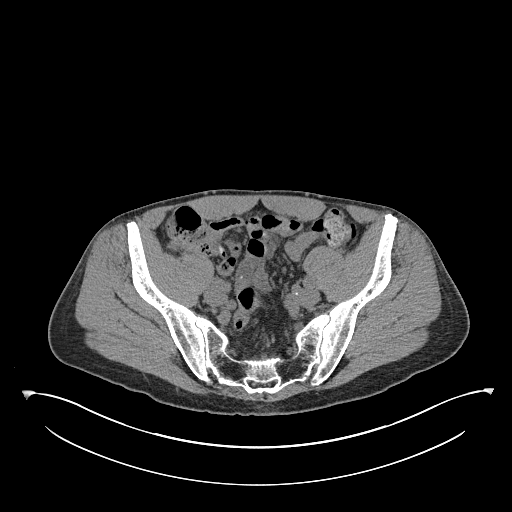
[im 105/142  soft-tissue]
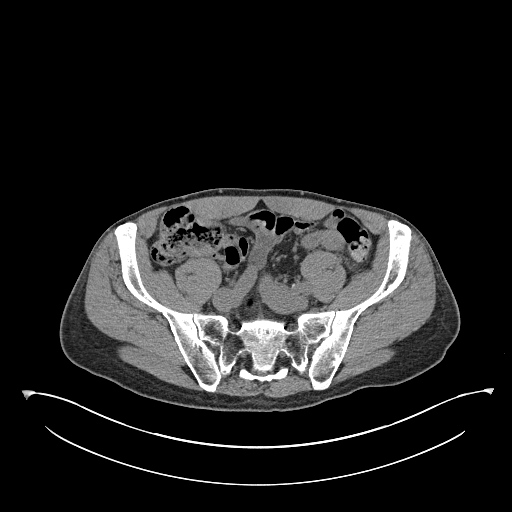
[im 114/142  soft-tissue]
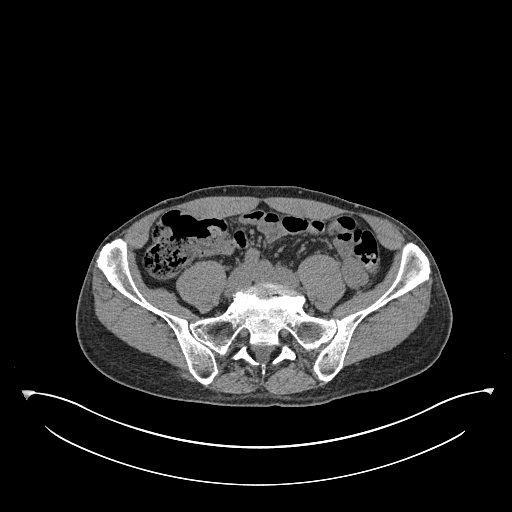
[im 123/142  soft-tissue]
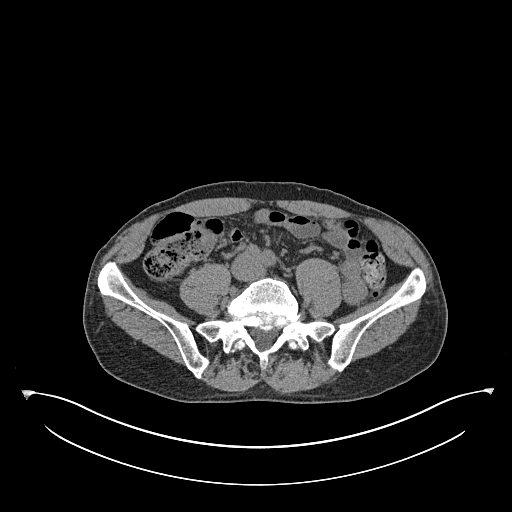
[im 132/142  soft-tissue]
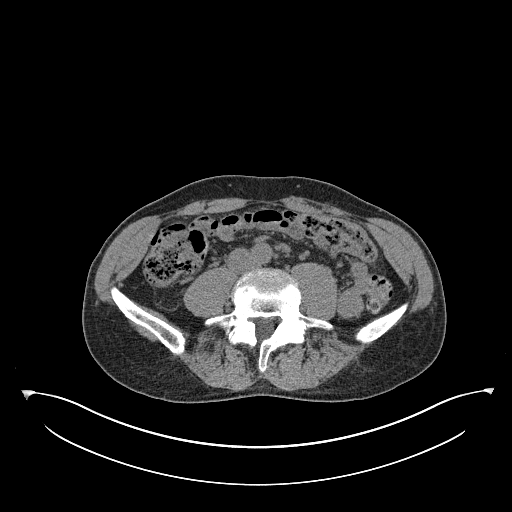

[Series 6: coronal st · coronal · 0.57mm/px · 3 of 107 slices shown]
[im 36/107  soft-tissue]
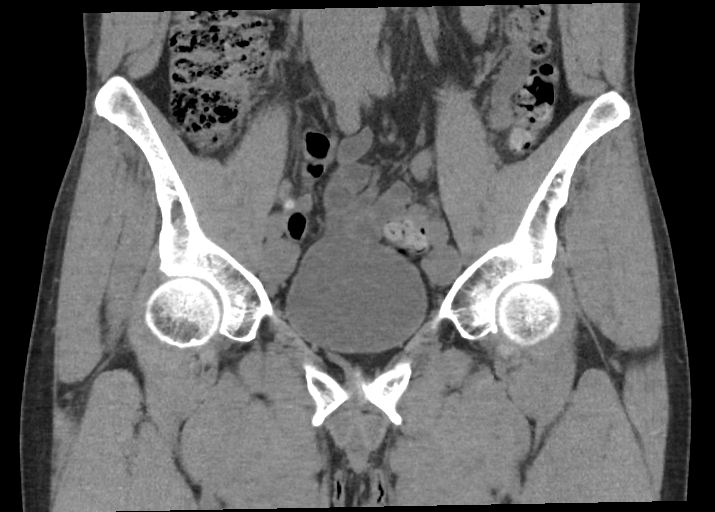
[im 48/107  soft-tissue]
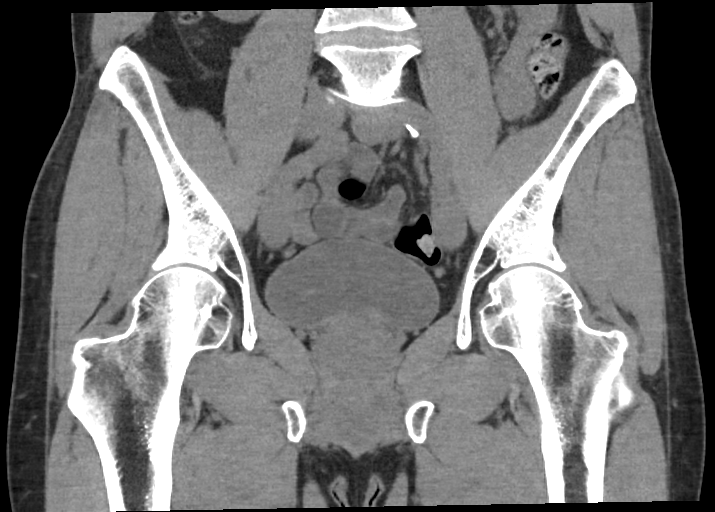
[im 59/107  soft-tissue]
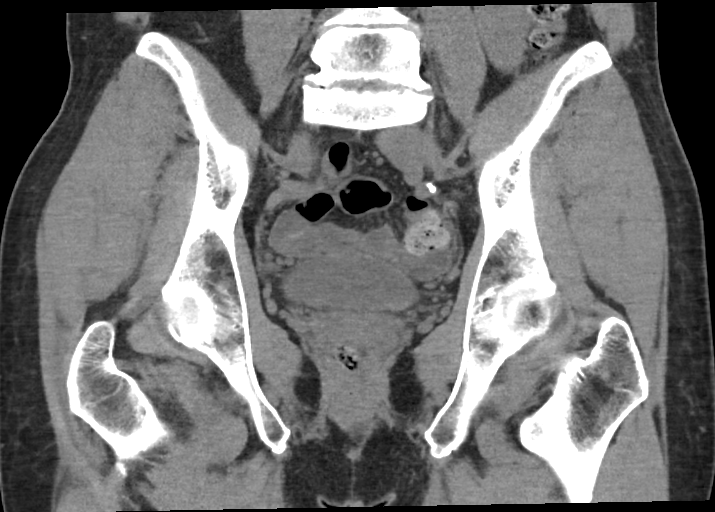

[17 of 46 positions shown; findings below may reference images not displayed]

FINDINGS: Urinary Tract:  No abnormality visualized.

Bowel:  Unremarkable visualized pelvic bowel loops.

Vascular/Lymphatic: No pathologically enlarged lymph nodes. No
significant vascular abnormality seen.

Reproductive:  Prostate is enlarged measuring 6 cm transverse.

Other:  None

Musculoskeletal: No suspicious bone lesions identified. Remote right
L4 and L5 laminectomies. No acute fracture.
IMPRESSION: 1. No acute pelvic abnormality.
2. Enlarged prostate.

## 2020-07-04 IMAGING — CR DG LUMBAR SPINE COMPLETE 4+V
6 series · 6 of 6 positions shown · non-contrast
Comparison: Lumbar radiographs 01/06/2018 and earlier.

CLINICAL DATA: 52-year-old male with acute severe low back and
buttock pain. Pain radiating down both legs.

EXAM:
LUMBAR SPINE - COMPLETE 4+ VIEW

[l-spine ap]
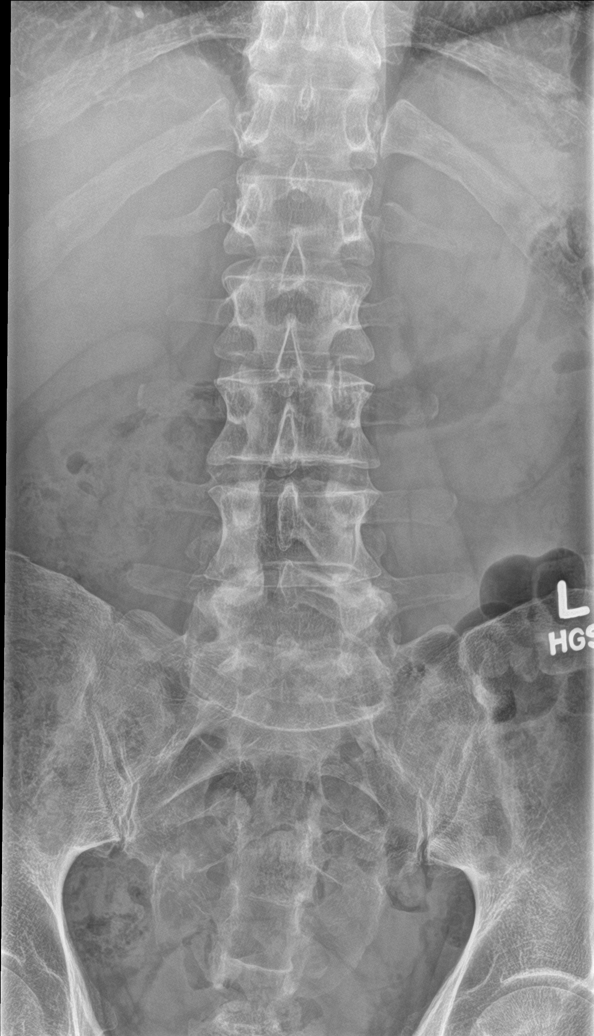

[l-spine obl (1 of 2)]
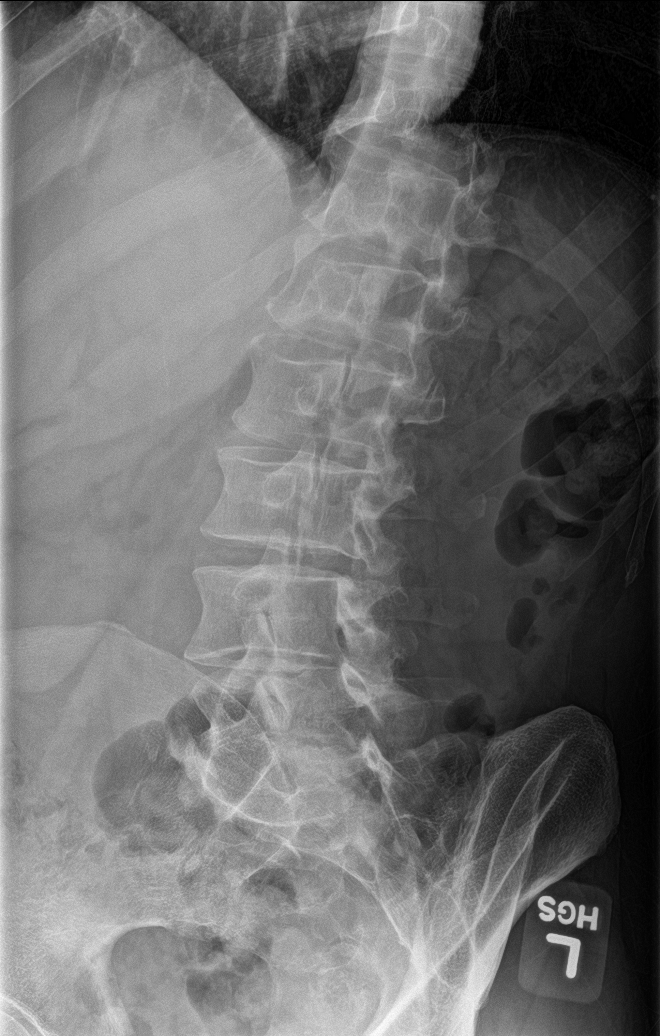

[l-spine obl (2 of 2)]
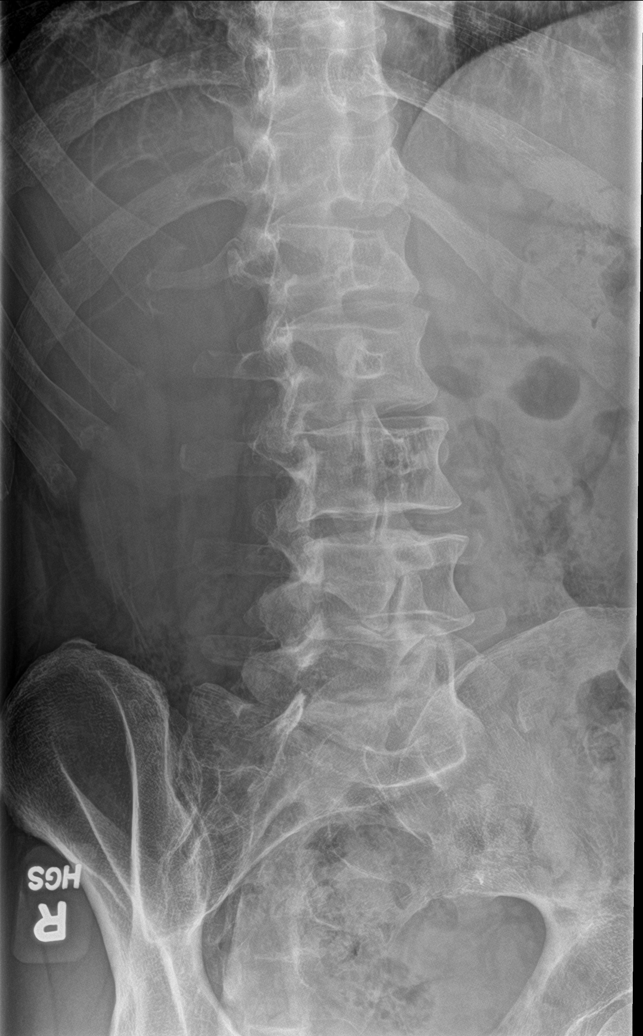

[l-spine lat]
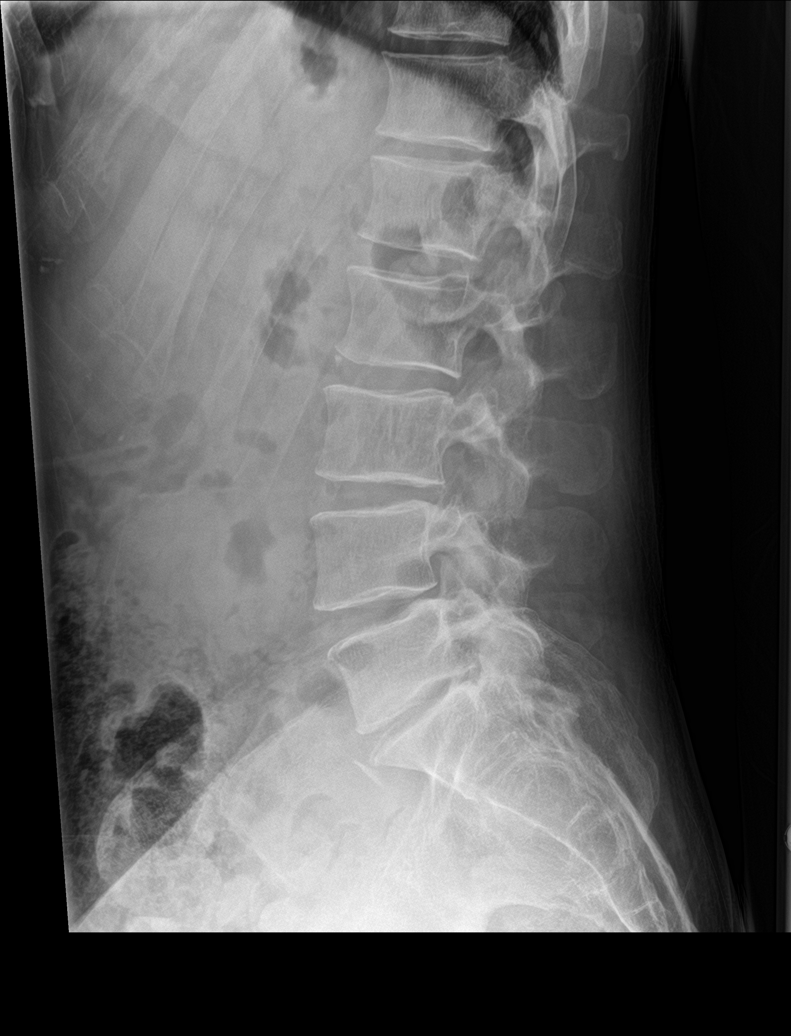

[l-spine spot (1 of 2)]
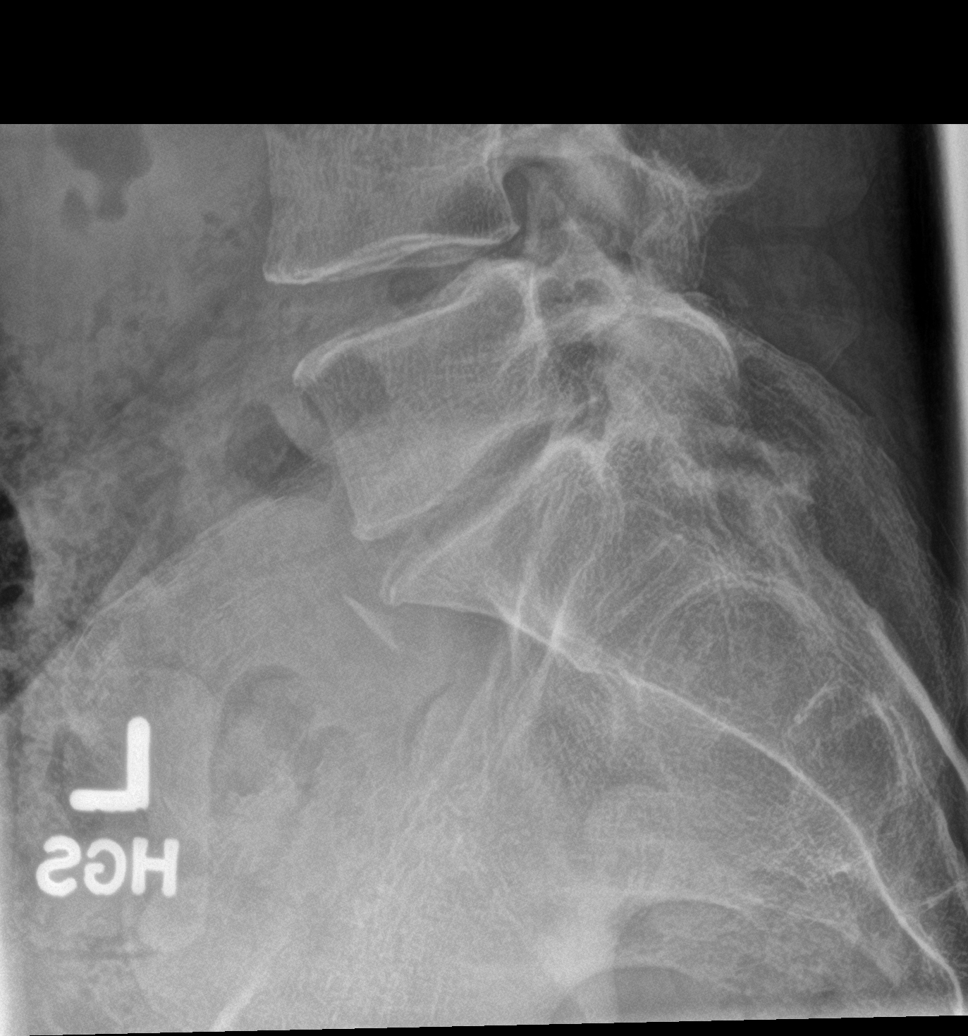

[l-spine spot (2 of 2)]
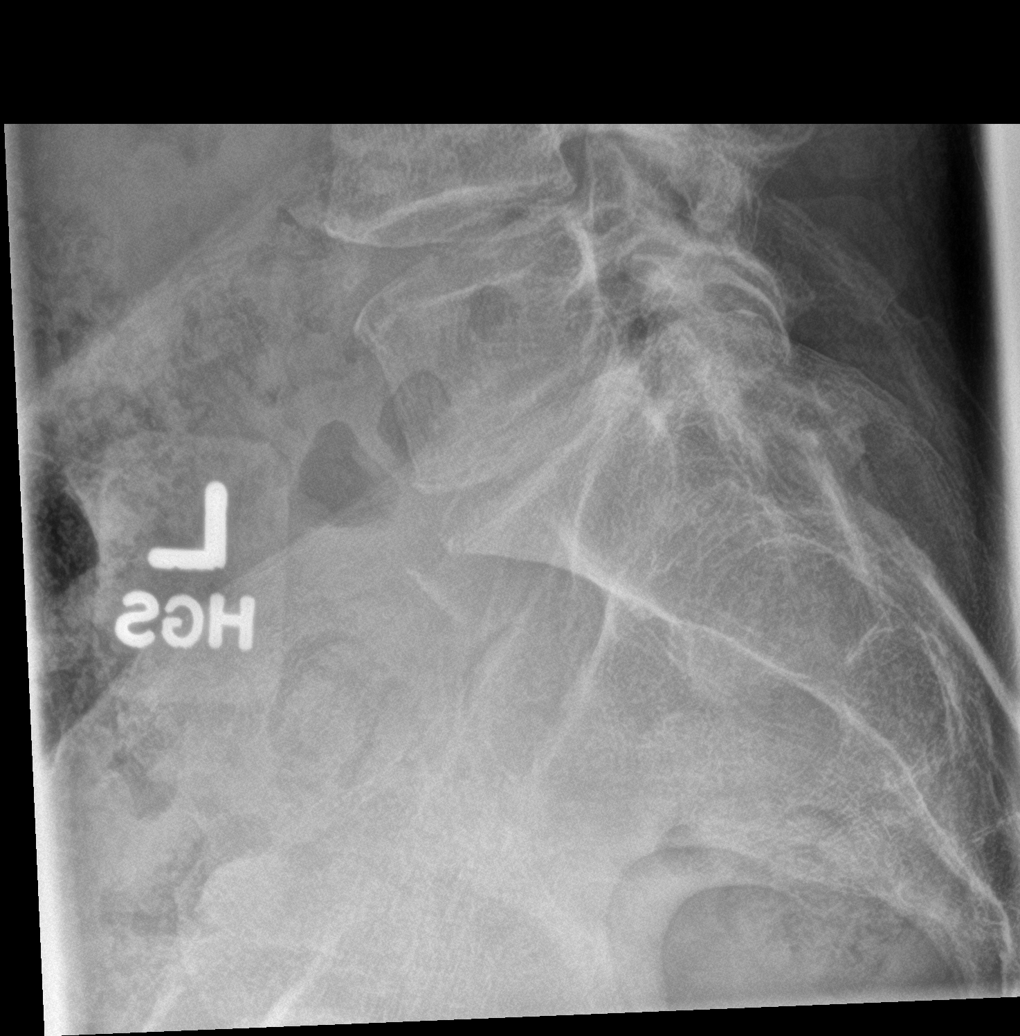

[6 of 6 positions shown; findings below may reference images not displayed]

FINDINGS: Hypoplastic ribs designated at L1 for the purposes of this report.
Full size ribs at T12. This is the same numbering system as on a
6624 MRI. Previous right lower lumbar laminectomies. Stable lumbar
lordosis. Stable disc spaces. No acute osseous abnormality
identified. Sacral ala and SI joints appear stable and within normal
limits. Negative abdominal visceral contour.
IMPRESSION: Stable radiographic appearance of the lumbar spine. Chronic
postoperative changes at L4 and L5.

## 2020-07-11 ENCOUNTER — Encounter: Payer: Self-pay | Admitting: Dermatology

## 2020-07-11 ENCOUNTER — Ambulatory Visit: Payer: BC Managed Care – PPO | Admitting: Dermatology

## 2020-07-11 ENCOUNTER — Other Ambulatory Visit: Payer: Self-pay

## 2020-07-11 DIAGNOSIS — Z1283 Encounter for screening for malignant neoplasm of skin: Secondary | ICD-10-CM

## 2020-07-11 DIAGNOSIS — L578 Other skin changes due to chronic exposure to nonionizing radiation: Secondary | ICD-10-CM | POA: Diagnosis not present

## 2020-07-11 DIAGNOSIS — Z85820 Personal history of malignant melanoma of skin: Secondary | ICD-10-CM | POA: Diagnosis not present

## 2020-07-11 DIAGNOSIS — L570 Actinic keratosis: Secondary | ICD-10-CM

## 2020-07-11 DIAGNOSIS — L821 Other seborrheic keratosis: Secondary | ICD-10-CM

## 2020-07-11 DIAGNOSIS — L609 Nail disorder, unspecified: Secondary | ICD-10-CM

## 2020-07-11 DIAGNOSIS — Z86018 Personal history of other benign neoplasm: Secondary | ICD-10-CM | POA: Diagnosis not present

## 2020-07-11 DIAGNOSIS — D1800 Hemangioma unspecified site: Secondary | ICD-10-CM

## 2020-07-11 DIAGNOSIS — L814 Other melanin hyperpigmentation: Secondary | ICD-10-CM

## 2020-07-11 DIAGNOSIS — D229 Melanocytic nevi, unspecified: Secondary | ICD-10-CM

## 2020-07-11 DIAGNOSIS — L820 Inflamed seborrheic keratosis: Secondary | ICD-10-CM

## 2020-07-11 NOTE — Progress Notes (Unsigned)
Follow-Up Visit   Subjective  Paul Pruitt is a 55 y.o. male who presents for the following: Annual Exam (Hx MM, dysplastic nevi ). The patient presents for Total-Body Skin Exam (TBSE) for skin cancer screening and mole check.  The following portions of the chart were reviewed this encounter and updated as appropriate:   Tobacco  Allergies  Meds  Problems  Med Hx  Surg Hx  Fam Hx     Review of Systems:  No other skin or systemic complaints except as noted in HPI or Assessment and Plan.  Objective  Well appearing patient in no apparent distress; mood and affect are within normal limits.  A full examination was performed including scalp, head, eyes, ears, nose, lips, neck, chest, axillae, abdomen, back, buttocks, bilateral upper extremities, bilateral lower extremities, hands, feet, fingers, toes, fingernails, and toenails. All findings within normal limits unless otherwise noted below.  Objective  L ear, nose (2): Erythematous thin papules/macules with gritty scale.   Objective  Trunk and extremities x 17, face x 7 (24): Erythematous keratotic or waxy stuck-on papule or plaque.   Objective  B/L foot: Toenail dystrophy   Assessment & Plan  AK (actinic keratosis) (2) L ear, nose  Destruction of lesion - L ear, nose Complexity: simple   Destruction method: cryotherapy   Informed consent: discussed and consent obtained   Timeout:  patient name, date of birth, surgical site, and procedure verified Lesion destroyed using liquid nitrogen: Yes   Region frozen until ice ball extended beyond lesion: Yes   Outcome: patient tolerated procedure well with no complications   Post-procedure details: wound care instructions given    Inflamed seborrheic keratosis (24) Trunk and extremities x 17, face x 7  Destruction of lesion - Trunk and extremities x 17, face x 7 Complexity: simple   Destruction method: cryotherapy   Informed consent: discussed and consent obtained    Timeout:  patient name, date of birth, surgical site, and procedure verified Lesion destroyed using liquid nitrogen: Yes   Region frozen until ice ball extended beyond lesion: Yes   Outcome: patient tolerated procedure well with no complications   Post-procedure details: wound care instructions given    Nail problem B/L foot  From running - benign appearing   Lentigines - Scattered tan macules - Discussed due to sun exposure - Benign, observe - Call for any changes  Seborrheic Keratoses - Stuck-on, waxy, tan-brown papules and plaques  - Discussed benign etiology and prognosis. - Observe - Call for any changes  Melanocytic Nevi - Tan-brown and/or pink-flesh-colored symmetric macules and papules - Benign appearing on exam today - Observation - Call clinic for new or changing moles - Recommend daily use of broad spectrum spf 30+ sunscreen to sun-exposed areas.   Hemangiomas - Red papules - Discussed benign nature - Observe - Call for any changes  Actinic Damage - Chronic, secondary to cumulative UV/sun exposure - diffuse scaly erythematous macules with underlying dyspigmentation - Recommend daily broad spectrum sunscreen SPF 30+ to sun-exposed areas, reapply every 2 hours as needed.  - Call for new or changing lesions.  History of Dysplastic Nevi - No evidence of recurrence today - Recommend regular full body skin exams - Recommend daily broad spectrum sunscreen SPF 30+ to sun-exposed areas, reapply every 2 hours as needed.  - Call if any new or changing lesions are noted between office visits  History of Melanoma - No evidence of recurrence today - No lymphadenopathy - Recommend regular full  body skin exams - Recommend daily broad spectrum sunscreen SPF 30+ to sun-exposed areas, reapply every 2 hours as needed.  - Call if any new or changing lesions are noted between office visits  Skin cancer screening performed today.  Return in about 1 year (around  07/11/2021) for TBSE.  Luther Redo, CMA, am acting as scribe for Sarina Ser, MD .  Documentation: I have reviewed the above documentation for accuracy and completeness, and I agree with the above.  Sarina Ser, MD

## 2020-07-13 ENCOUNTER — Encounter: Payer: Self-pay | Admitting: Dermatology

## 2020-07-18 DIAGNOSIS — Z23 Encounter for immunization: Secondary | ICD-10-CM | POA: Diagnosis not present

## 2020-07-26 DIAGNOSIS — R0602 Shortness of breath: Secondary | ICD-10-CM | POA: Diagnosis not present

## 2020-08-06 DIAGNOSIS — R002 Palpitations: Secondary | ICD-10-CM | POA: Diagnosis not present

## 2020-08-31 DIAGNOSIS — Z23 Encounter for immunization: Secondary | ICD-10-CM | POA: Diagnosis not present

## 2020-08-31 DIAGNOSIS — R079 Chest pain, unspecified: Secondary | ICD-10-CM | POA: Diagnosis not present

## 2020-08-31 DIAGNOSIS — R0602 Shortness of breath: Secondary | ICD-10-CM | POA: Diagnosis not present

## 2020-08-31 DIAGNOSIS — R002 Palpitations: Secondary | ICD-10-CM | POA: Diagnosis not present

## 2020-12-30 ENCOUNTER — Encounter: Payer: Self-pay | Admitting: Internal Medicine

## 2020-12-31 DIAGNOSIS — R0602 Shortness of breath: Secondary | ICD-10-CM | POA: Diagnosis not present

## 2020-12-31 DIAGNOSIS — R002 Palpitations: Secondary | ICD-10-CM | POA: Diagnosis not present

## 2021-02-11 ENCOUNTER — Ambulatory Visit (INDEPENDENT_AMBULATORY_CARE_PROVIDER_SITE_OTHER): Payer: BC Managed Care – PPO | Admitting: Internal Medicine

## 2021-02-11 ENCOUNTER — Other Ambulatory Visit: Payer: Self-pay

## 2021-02-11 VITALS — BP 122/70 | HR 55 | Temp 97.8°F | Resp 16 | Ht 76.0 in | Wt 199.0 lb

## 2021-02-11 DIAGNOSIS — Z0000 Encounter for general adult medical examination without abnormal findings: Secondary | ICD-10-CM | POA: Diagnosis not present

## 2021-02-11 DIAGNOSIS — Z125 Encounter for screening for malignant neoplasm of prostate: Secondary | ICD-10-CM

## 2021-02-11 DIAGNOSIS — R200 Anesthesia of skin: Secondary | ICD-10-CM

## 2021-02-11 DIAGNOSIS — Z23 Encounter for immunization: Secondary | ICD-10-CM | POA: Diagnosis not present

## 2021-02-11 DIAGNOSIS — D696 Thrombocytopenia, unspecified: Secondary | ICD-10-CM | POA: Diagnosis not present

## 2021-02-11 DIAGNOSIS — E559 Vitamin D deficiency, unspecified: Secondary | ICD-10-CM

## 2021-02-11 DIAGNOSIS — R0602 Shortness of breath: Secondary | ICD-10-CM

## 2021-02-11 DIAGNOSIS — Z13220 Encounter for screening for lipoid disorders: Secondary | ICD-10-CM

## 2021-02-11 DIAGNOSIS — R351 Nocturia: Secondary | ICD-10-CM | POA: Diagnosis not present

## 2021-02-11 DIAGNOSIS — N401 Enlarged prostate with lower urinary tract symptoms: Secondary | ICD-10-CM

## 2021-02-11 DIAGNOSIS — Z800 Family history of malignant neoplasm of digestive organs: Secondary | ICD-10-CM

## 2021-02-11 DIAGNOSIS — Z86010 Personal history of colonic polyps: Secondary | ICD-10-CM

## 2021-02-11 DIAGNOSIS — N138 Other obstructive and reflux uropathy: Secondary | ICD-10-CM

## 2021-02-11 LAB — COMPREHENSIVE METABOLIC PANEL
ALT: 19 U/L (ref 0–53)
AST: 19 U/L (ref 0–37)
Albumin: 4.5 g/dL (ref 3.5–5.2)
Alkaline Phosphatase: 64 U/L (ref 39–117)
BUN: 33 mg/dL — ABNORMAL HIGH (ref 6–23)
CO2: 29 mEq/L (ref 19–32)
Calcium: 9.3 mg/dL (ref 8.4–10.5)
Chloride: 107 mEq/L (ref 96–112)
Creatinine, Ser: 1.02 mg/dL (ref 0.40–1.50)
GFR: 83.65 mL/min (ref >60.00)
Glucose, Bld: 85 mg/dL (ref 70–99)
Potassium: 4.2 mEq/L (ref 3.5–5.1)
Sodium: 143 mEq/L (ref 135–145)
Total Bilirubin: 0.9 mg/dL (ref 0.2–1.2)
Total Protein: 6.7 g/dL (ref 6.0–8.3)

## 2021-02-11 LAB — CBC WITH DIFFERENTIAL/PLATELET
Basophils Absolute: 0 10*3/uL (ref 0.0–0.1)
Basophils Relative: 0.9 % (ref 0.0–3.0)
Eosinophils Absolute: 0 10*3/uL (ref 0.0–0.7)
Eosinophils Relative: 0.8 % (ref 0.0–5.0)
HCT: 42.7 % (ref 39.0–52.0)
Hemoglobin: 14.5 g/dL (ref 13.0–17.0)
Lymphocytes Relative: 27.3 % (ref 12.0–46.0)
Lymphs Abs: 1.1 10*3/uL (ref 0.7–4.0)
MCHC: 33.8 g/dL (ref 30.0–36.0)
MCV: 89.7 fl (ref 78.0–100.0)
Monocytes Absolute: 0.3 10*3/uL (ref 0.1–1.0)
Monocytes Relative: 6.9 % (ref 3.0–12.0)
Neutro Abs: 2.6 10*3/uL (ref 1.4–7.7)
Neutrophils Relative %: 64.1 % (ref 43.0–77.0)
Platelets: 169 10*3/uL (ref 150.0–400.0)
RBC: 4.76 Mil/uL (ref 4.22–5.81)
RDW: 12.4 % (ref 11.5–15.5)
WBC: 4.1 10*3/uL (ref 4.0–10.5)

## 2021-02-11 LAB — LIPID PANEL
Cholesterol: 168 mg/dL (ref 0–200)
HDL: 65.6 mg/dL (ref >39.00)
LDL Cholesterol: 94 mg/dL (ref 0–99)
NonHDL: 102.04
Total CHOL/HDL Ratio: 3
Triglycerides: 38 mg/dL (ref 0.0–149.0)
VLDL: 7.6 mg/dL (ref 0.0–40.0)

## 2021-02-11 LAB — URINALYSIS, ROUTINE W REFLEX MICROSCOPIC
Bilirubin Urine: NEGATIVE
Hgb urine dipstick: NEGATIVE
Ketones, ur: NEGATIVE
Leukocytes,Ua: NEGATIVE
Nitrite: NEGATIVE
RBC / HPF: NONE SEEN (ref 0–?)
Specific Gravity, Urine: 1.025 (ref 1.000–1.030)
Total Protein, Urine: NEGATIVE
Urine Glucose: NEGATIVE
Urobilinogen, UA: 0.2 (ref 0.0–1.0)
pH: 6 (ref 5.0–8.0)

## 2021-02-11 LAB — PSA: PSA: 0.54 ng/mL (ref 0.10–4.00)

## 2021-02-11 LAB — VITAMIN D 25 HYDROXY (VIT D DEFICIENCY, FRACTURES): VITD: 29.04 ng/mL — ABNORMAL LOW (ref 30.00–100.00)

## 2021-02-11 LAB — VITAMIN B12: Vitamin B-12: 187 pg/mL — ABNORMAL LOW (ref 211–911)

## 2021-02-11 LAB — TSH: TSH: 3.34 u[IU]/mL (ref 0.35–5.50)

## 2021-02-11 NOTE — Progress Notes (Signed)
Patient ID: Paul Pruitt, male   DOB: 12-16-1965, 55 y.o.   MRN: [id number]   Subjective:    Patient ID: Paul Pruitt, male    DOB: December 12, 1965, 55 y.o.   MRN: [id number]  This visit occurred during the SARS-CoV-2 public health emergency.  Safety protocols were in place, including screening questions prior to the visit, additional usage of staff PPE, and extensive cleaning of exam room while observing appropriate contact time as indicated for disinfecting solutions.   Patient here for his physical exam.  Chief Complaint  Patient presents with   Annual Exam   .   HPI Here for physical exam.  He is doing well.  Feels good.  Is exercising - runs, cross fit 2-3x/week.  No chest pain or sob reported.  No abdominal pain.  Bowels moving.  Increased stress at work, but overall he feels he is handling things relatively well.  Does not feel he needs any further intervention.  Does reports some numbness - left fourth finger - intermittent.  No weakness.  No numbness/tingling up his arm. No neck pain or stiffness reported.  Saw Dr Saralyn Pilar 12/31/20 - for f/u.  Note reviewed.  ECHO - 07/26/20 - EF > 555 with trivial valvular insufficiencies and mildly dilated aortic root 4.3cm.  recommended f/u in one year.  Agreeable for colonoscopy.  Does report nocturia - 4-5x/year.  Some decrease in urine stream.   Past Medical History:  Diagnosis Date   Dysplastic nevus 04/09/2015   left post lateral deltoid, mild atypia    Dysplastic nevus 05/17/2014   right prox med thigh, mild atypia    Dysplastic nevus 08/22/2008   right post shoulder, slight atypia    Dysplastic nevus 01/04/2007   R sup post lat thigh, slight atypia    Dysplastic nevus 09/15/2006   left post waistline paraspinal, severe atypia    Dysplastic nevus 07/09/2006   Left lower back, slight atypia    Dysplastic nevus 07/09/2006   right ear, moderate atypia    Melanoma ([place]) 1995   L pectoral    Past Surgical History:  Procedure Laterality Date    BACK SURGERY N/A 2004, 2005   L3, 4 and L4,5   CATARACT EXTRACTION, BILATERAL     08/12/16 and 08/19/16.   COLONOSCOPY  02/07/2016   By dr Carlisle Beers polyps, internal hemorrhoids, waiting path report   HERNIA REPAIR     LAMINECTOMY     x 2   MELANOMA EXCISION     chest area   TONSILLECTOMY     Family History  Problem Relation Age of Onset   Arthritis Mother    Colon cancer Mother    Hypertension Father    Asthma Father    Hypertension Brother    Obesity Brother    Asthma Son    Obesity Son    Kidney disease Neg Hx    Prostate cancer Neg Hx    Social History   Socioeconomic History   Marital status: Married    Spouse name: Not on file   Number of children: Not on file   Years of education: Not on file   Highest education level: Not on file  Occupational History   Not on file  Tobacco Use   Smoking status: Never   Smokeless tobacco: Never  Vaping Use   Vaping Use: Never used  Substance and Sexual Activity   Alcohol use: Not Currently    Alcohol/week: 0.0 standard drinks    Comment: seldomly-3  drinks a year maybe   Drug use: No   Sexual activity: Yes    Birth control/protection: None  Other Topics Concern   Not on file  Social History Narrative   Patient states heavy exercise. 7 days per week x 30-90 minutes.   Social Determinants of Health   Financial Resource Strain: Not on file  Food Insecurity: Not on file  Transportation Needs: Not on file  Physical Activity: Not on file  Stress: Not on file  Social Connections: Not on file    Review of Systems  Constitutional:  Negative for appetite change and unexpected weight change.  HENT:  Negative for congestion, sinus pressure and sore throat.   Eyes:  Negative for pain and visual disturbance.  Respiratory:  Negative for cough, chest tightness and shortness of breath.   Cardiovascular:  Negative for chest pain, palpitations and leg swelling.  Gastrointestinal:  Negative for abdominal pain, diarrhea, nausea  and vomiting.  Genitourinary:  Negative for difficulty urinating and dysuria.  Musculoskeletal:  Negative for joint swelling and myalgias.  Skin:  Negative for color change and rash.  Neurological:  Negative for dizziness, light-headedness and headaches.  Hematological:  Negative for adenopathy. Does not bruise/bleed easily.  Psychiatric/Behavioral:  Negative for agitation and dysphoric mood.       Objective:     BP 122/70   Pulse (!) 55   Temp 97.8 F (36.6 C)   Resp 16   Ht '6\' 4"'$  (1.93 m)   Wt 199 lb (90.3 kg)   SpO2 98%   BMI 24.22 kg/m  Wt Readings from Last 3 Encounters:  02/11/21 199 lb (90.3 kg)  06/26/20 203 lb (92.1 kg)  02/10/20 198 lb (89.8 kg)    Physical Exam Constitutional:      General: He is not in acute distress.    Appearance: Normal appearance. He is well-developed.  HENT:     Head: Normocephalic and atraumatic.     Right Ear: External ear normal.     Left Ear: External ear normal.  Eyes:     General: No scleral icterus.       Right eye: No discharge.        Left eye: No discharge.     Conjunctiva/sclera: Conjunctivae normal.  Neck:     Thyroid: No thyromegaly.  Cardiovascular:     Rate and Rhythm: Normal rate and regular rhythm.  Pulmonary:     Effort: No respiratory distress.     Breath sounds: Normal breath sounds. No wheezing.  Abdominal:     General: Bowel sounds are normal.     Palpations: Abdomen is soft.     Tenderness: There is no abdominal tenderness.  Genitourinary:    Comments: Rectal:  enlarged prostate.  No distinct nodule palpated. Question increased fullness - more right lobe of prostate.  Musculoskeletal:        General: No swelling or tenderness.     Cervical back: Neck supple. No tenderness.  Lymphadenopathy:     Cervical: No cervical adenopathy.  Skin:    Findings: No erythema or rash.  Neurological:     Mental Status: He is alert and oriented to person, place, and time.  Psychiatric:        Mood and Affect: Mood  normal.        Behavior: Behavior normal.     Outpatient Encounter Medications as of 02/11/2021  Medication Sig   [DISCONTINUED] ibuprofen (ADVIL) 800 MG tablet TAKE 1 TABLET (800 MG TOTAL) BY  MOUTH 2 (TWO) TIMES DAILY AS NEEDED FOR MODERATE PAIN.   [DISCONTINUED] VITAMIN D PO Take by mouth. (Patient not taking: No sig reported)   Facility-Administered Encounter Medications as of 02/11/2021  Medication   albuterol (VENTOLIN HFA) 108 (90 Base) MCG/ACT inhaler 2 puff     Lab Results  Component Value Date   WBC 4.1 02/11/2021   HGB 14.5 02/11/2021   HCT 42.7 02/11/2021   PLT 169.0 02/11/2021   GLUCOSE 85 02/11/2021   CHOL 168 02/11/2021   TRIG 38.0 02/11/2021   HDL 65.60 02/11/2021   LDLCALC 94 02/11/2021   ALT 19 02/11/2021   AST 19 02/11/2021   NA 143 02/11/2021   K 4.2 02/11/2021   CL 107 02/11/2021   CREATININE 1.02 02/11/2021   BUN 33 (H) 02/11/2021   CO2 29 02/11/2021   TSH 3.34 02/11/2021   PSA 0.54 02/11/2021       Assessment & Plan:   Problem List Items Addressed This Visit     BPH with obstruction/lower urinary tract symptoms    Previously had tried flomax.  Did not feel made a difference.  Nocturia as outlined.  Decreased force of stream as outlined.  Exam as outlined - question of more fullness - right (prostate).  Check PSA.  Consider referral to urology.       Relevant Orders   Ambulatory referral to Urology   Family history of colon cancer    Due colonoscopy.  Order placed for referral.       Relevant Orders   Ambulatory referral to Gastroenterology   Healthcare maintenance    Physical today 02/11/21.  Colonoscopy 01/2016 as outlined.  Recommended f/u in 01/2021.  Order placed for referral.  Check psa.       Hx of colonic polyp    Colonoscopy 01/2016 - two diminutive polyps in the sigmoid colon and internal hemorrhoids (pathology hyperplastic polyps).  Recommended f/u colonoscopy 01/2021.  Refer to Dr Dwaine Deter.       Relevant Orders   Ambulatory  referral to Gastroenterology   Nocturia    Check urine.  Prostate evaluation as outlined.  Consider urology referral.       Relevant Orders   Urinalysis, Routine w reflex microscopic (Completed)   Ambulatory referral to Urology   Numbness in feet    Intermittent.  No focal deficits noted.  Check B12 level and routine labs.        Relevant Orders   B12 (Completed)   Vitamin D (25 hydroxy) (Completed)   SOB (shortness of breath)    Previous "sob" as outlined.  Saw cardiology.  W/up as outlined.  Exercises regularly.  Feels good.  ECHO as outlined.  Cardiology recommended f/u in one year.        Thrombocytopenia ([place])   Relevant Orders   CBC with Differential/Platelet (Completed)   Comprehensive metabolic panel (Completed)   TSH (Completed)   Vitamin D deficiency    Recheck vitamin d level.       Relevant Orders   Vitamin D (25 hydroxy) (Completed)   Other Visit Diagnoses     Routine general medical examination at a health care facility    -  Primary   Prostate cancer screening       Relevant Orders   PSA (Completed)   Screening cholesterol level       Relevant Orders   Lipid panel (Completed)   Need for immunization against influenza       Relevant Orders  Flu Vaccine QUAD 7moIM (Fluarix, Fluzone & Alfiuria Quad PF) (Completed)        CEinar Pheasant MD

## 2021-02-12 DIAGNOSIS — Z23 Encounter for immunization: Secondary | ICD-10-CM | POA: Diagnosis not present

## 2021-02-12 DIAGNOSIS — Z0000 Encounter for general adult medical examination without abnormal findings: Secondary | ICD-10-CM | POA: Diagnosis not present

## 2021-02-16 ENCOUNTER — Encounter: Payer: Self-pay | Admitting: Internal Medicine

## 2021-02-16 NOTE — Assessment & Plan Note (Signed)
Due colonoscopy.  Order placed for referral.

## 2021-02-16 NOTE — Assessment & Plan Note (Signed)
Physical today 02/11/21.  Colonoscopy 01/2016 as outlined.  Recommended f/u in 01/2021.  Order placed for referral.  Check psa.

## 2021-02-16 NOTE — Assessment & Plan Note (Signed)
Previously had tried flomax.  Did not feel made a difference.  Nocturia as outlined.  Decreased force of stream as outlined.  Exam as outlined - question of more fullness - right (prostate).  Check PSA.  Consider referral to urology.

## 2021-02-16 NOTE — Assessment & Plan Note (Signed)
Colonoscopy 01/2016 - two diminutive polyps in the sigmoid colon and internal hemorrhoids (pathology hyperplastic polyps).  Recommended f/u colonoscopy 01/2021.  Refer to Dr Dwaine Deter.

## 2021-02-16 NOTE — Assessment & Plan Note (Signed)
Intermittent.  No focal deficits noted.  Check B12 level and routine labs.

## 2021-02-16 NOTE — Assessment & Plan Note (Signed)
Check urine.  Prostate evaluation as outlined.  Consider urology referral.

## 2021-02-16 NOTE — Assessment & Plan Note (Signed)
Recheck vitamin d level.

## 2021-02-16 NOTE — Assessment & Plan Note (Signed)
Previous "sob" as outlined.  Saw cardiology.  W/up as outlined.  Exercises regularly.  Feels good.  ECHO as outlined.  Cardiology recommended f/u in one year.

## 2021-03-04 ENCOUNTER — Telehealth: Payer: Self-pay | Admitting: Internal Medicine

## 2021-03-04 NOTE — Telephone Encounter (Signed)
Patient is returning your call from earlier to go over his lab results.

## 2021-03-05 NOTE — Telephone Encounter (Signed)
See result note.  

## 2021-05-21 DIAGNOSIS — M47896 Other spondylosis, lumbar region: Secondary | ICD-10-CM | POA: Diagnosis not present

## 2021-05-21 DIAGNOSIS — M25551 Pain in right hip: Secondary | ICD-10-CM | POA: Diagnosis not present

## 2021-05-21 DIAGNOSIS — M47816 Spondylosis without myelopathy or radiculopathy, lumbar region: Secondary | ICD-10-CM | POA: Insufficient documentation

## 2021-05-27 DIAGNOSIS — Z86010 Personal history of colonic polyps: Secondary | ICD-10-CM | POA: Diagnosis not present

## 2021-05-27 DIAGNOSIS — Z800 Family history of malignant neoplasm of digestive organs: Secondary | ICD-10-CM | POA: Diagnosis not present

## 2021-05-30 DIAGNOSIS — M25551 Pain in right hip: Secondary | ICD-10-CM | POA: Diagnosis not present

## 2021-06-02 DIAGNOSIS — I889 Nonspecific lymphadenitis, unspecified: Secondary | ICD-10-CM | POA: Diagnosis not present

## 2021-06-03 ENCOUNTER — Encounter: Payer: Self-pay | Admitting: Internal Medicine

## 2021-06-03 ENCOUNTER — Telehealth: Payer: Self-pay

## 2021-06-03 NOTE — Telephone Encounter (Signed)
I went to the Urgent care on the corner of [address] yesterday due to a week of soreness in my left lymph node.  The didn't observe anything but took blood to check for an infection.  They recommended an ultrasound and said I should follow up with Dr. Nicki Reaper which I am doing now.  I expect to have the labs back in 3-5 days - and possibly today.

## 2021-06-03 NOTE — Telephone Encounter (Signed)
LMTCB

## 2021-06-03 NOTE — Telephone Encounter (Signed)
Pt call in regards to previous message. Pt states he was advised to set up a follow up appt. Due to limited appt availability pt would like a call back

## 2021-06-06 DIAGNOSIS — M25551 Pain in right hip: Secondary | ICD-10-CM | POA: Diagnosis not present

## 2021-06-20 NOTE — Telephone Encounter (Signed)
Patient called in stated returning call to Larena Glassman, Please call Patient back @ [phone]

## 2021-06-20 NOTE — Telephone Encounter (Signed)
LMTCB

## 2021-06-21 NOTE — Telephone Encounter (Signed)
LMTCB & sent mychart message 

## 2021-06-24 ENCOUNTER — Encounter: Payer: Self-pay | Admitting: Internal Medicine

## 2021-06-24 NOTE — Telephone Encounter (Signed)
In reviewing the chart, it appears that ortho is seeing him and prescribing the ibuprofen.  Since they are following him, I would have him contact them for a refill.  Let us know if any problems.  There is apparently a phone message also regarding this.

## 2021-06-24 NOTE — Telephone Encounter (Signed)
Have not refilled since 05/2020. OK to send in refill for him?

## 2021-06-24 NOTE — Telephone Encounter (Signed)
Called patient to let him know of below. Called [facility] and had them send back urgent refill request to Dr Sharlet Salina. Patient aware.

## 2021-07-01 DIAGNOSIS — M47896 Other spondylosis, lumbar region: Secondary | ICD-10-CM | POA: Diagnosis not present

## 2021-07-01 DIAGNOSIS — M25551 Pain in right hip: Secondary | ICD-10-CM | POA: Diagnosis not present

## 2021-07-01 NOTE — Telephone Encounter (Signed)
See more recent note for documentation. This is a duplicate

## 2021-07-12 DIAGNOSIS — K640 First degree hemorrhoids: Secondary | ICD-10-CM | POA: Diagnosis not present

## 2021-07-12 DIAGNOSIS — Z800 Family history of malignant neoplasm of digestive organs: Secondary | ICD-10-CM | POA: Diagnosis not present

## 2021-07-12 DIAGNOSIS — D125 Benign neoplasm of sigmoid colon: Secondary | ICD-10-CM | POA: Diagnosis not present

## 2021-07-12 DIAGNOSIS — Z86010 Personal history of colonic polyps: Secondary | ICD-10-CM | POA: Diagnosis not present

## 2021-07-17 DIAGNOSIS — M47896 Other spondylosis, lumbar region: Secondary | ICD-10-CM | POA: Diagnosis not present

## 2021-07-18 ENCOUNTER — Ambulatory Visit (INDEPENDENT_AMBULATORY_CARE_PROVIDER_SITE_OTHER): Payer: BC Managed Care – PPO | Admitting: Dermatology

## 2021-07-18 ENCOUNTER — Other Ambulatory Visit: Payer: Self-pay

## 2021-07-18 ENCOUNTER — Encounter: Payer: Self-pay | Admitting: Dermatology

## 2021-07-18 DIAGNOSIS — L814 Other melanin hyperpigmentation: Secondary | ICD-10-CM

## 2021-07-18 DIAGNOSIS — Z85820 Personal history of malignant melanoma of skin: Secondary | ICD-10-CM

## 2021-07-18 DIAGNOSIS — D1800 Hemangioma unspecified site: Secondary | ICD-10-CM

## 2021-07-18 DIAGNOSIS — L570 Actinic keratosis: Secondary | ICD-10-CM | POA: Diagnosis not present

## 2021-07-18 DIAGNOSIS — L578 Other skin changes due to chronic exposure to nonionizing radiation: Secondary | ICD-10-CM

## 2021-07-18 DIAGNOSIS — L821 Other seborrheic keratosis: Secondary | ICD-10-CM

## 2021-07-18 DIAGNOSIS — Z1283 Encounter for screening for malignant neoplasm of skin: Secondary | ICD-10-CM

## 2021-07-18 DIAGNOSIS — D229 Melanocytic nevi, unspecified: Secondary | ICD-10-CM | POA: Diagnosis not present

## 2021-07-18 DIAGNOSIS — Z86018 Personal history of other benign neoplasm: Secondary | ICD-10-CM

## 2021-07-18 NOTE — Progress Notes (Signed)
Follow-Up Visit   Subjective  Paul Pruitt is a 56 y.o. male who presents for the following: Follow-up (Patient here today for 1 tbse. Patient has history of melanoma, dysplastic nevi and AKs ).  Patient here for full body skin exam and skin cancer screening.   The following portions of the chart were reviewed this encounter and updated as appropriate:  Tobacco   Allergies   Meds   Problems   Med Hx   Surg Hx   Fam Hx       Review of Systems: No other skin or systemic complaints except as noted in HPI or Assessment and Plan.   Objective  Well appearing patient in no apparent distress; mood and affect are within normal limits.  A full examination was performed including scalp, head, eyes, ears, nose, lips, neck, chest, axillae, abdomen, back, buttocks, bilateral upper extremities, bilateral lower extremities, hands, feet, fingers, toes, fingernails, and toenails. All findings within normal limits unless otherwise noted below.  left chest x 1 Erythematous thin papules/macules with gritty scale.    Assessment & Plan  Actinic keratosis left chest x 1  Actinic keratoses are precancerous spots that appear secondary to cumulative UV radiation exposure/sun exposure over time. They are chronic with expected duration over 1 year. A portion of actinic keratoses will progress to squamous cell carcinoma of the skin. It is not possible to reliably predict which spots will progress to skin cancer and so treatment is recommended to prevent development of skin cancer.  Recommend daily broad spectrum sunscreen SPF 30+ to sun-exposed areas, reapply every 2 hours as needed.  Recommend staying in the shade or wearing long sleeves, sun glasses (UVA+UVB protection) and wide brim hats (4-inch brim around the entire circumference of the hat). Call for new or changing lesions.  Prior to procedure, discussed risks of blister formation, small wound, skin dyspigmentation, or rare scar following cryotherapy.  Recommend Vaseline ointment to treated areas while healing.   Destruction of lesion - left chest x 1  Destruction method: cryotherapy   Informed consent: discussed and consent obtained   Lesion destroyed using liquid nitrogen: Yes   Cryotherapy cycles:  2 Outcome: patient tolerated procedure well with no complications   Post-procedure details: wound care instructions given   Additional details:  Prior to procedure, discussed risks of blister formation, small wound, skin dyspigmentation, or rare scar following cryotherapy. Recommend Vaseline ointment to treated areas while healing.   Lentigines - Scattered tan macules - Due to sun exposure - Benign-appearing, observe - Recommend daily broad spectrum sunscreen SPF 30+ to sun-exposed areas, reapply every 2 hours as needed. - Call for any changes  Seborrheic Keratoses - Stuck-on, waxy, tan-brown papules and/or plaques  - Benign-appearing - Discussed benign etiology and prognosis. - Observe - Call for any changes  Melanocytic Nevi - Tan-brown and/or pink-flesh-colored symmetric macules and papules - Benign appearing on exam today - Observation - Call clinic for new or changing moles - Recommend daily use of broad spectrum spf 30+ sunscreen to sun-exposed areas.   Hemangiomas - Red papules - Discussed benign nature - Observe - Call for any changes  Actinic Damage - Chronic condition, secondary to cumulative UV/sun exposure - diffuse scaly erythematous macules with underlying dyspigmentation - Recommend daily broad spectrum sunscreen SPF 30+ to sun-exposed areas, reapply every 2 hours as needed.  - Staying in the shade or wearing long sleeves, sun glasses (UVA+UVB protection) and wide brim hats (4-inch brim around the entire circumference of  the hat) are also recommended for sun protection.  - Call for new or changing lesions.  History of Dysplastic Nevi - No evidence of recurrence today at multiple locations see history  -  Recommend regular full body skin exams - Recommend daily broad spectrum sunscreen SPF 30+ to sun-exposed areas, reapply every 2 hours as needed.  - Call if any new or changing lesions are noted between office visits  History of Melanoma - No evidence of recurrence today left pectoral 1995 - No lymphadenopathy - Recommend regular full body skin exams - Recommend daily broad spectrum sunscreen SPF 30+ to sun-exposed areas, reapply every 2 hours as needed.  - Call if any new or changing lesions are noted between office visits  Skin cancer screening performed today. Return for 1 year tbse h/o melanoma h/o dysplastic .  I, Ruthell Rummage, CMA, am acting as scribe for Forest Gleason, MD.  Documentation: I have reviewed the above documentation for accuracy and completeness, and I agree with the above.  Forest Gleason, MD

## 2021-07-18 NOTE — Patient Instructions (Addendum)
Recommend vitamin d 600 - 800 units daily to prevent vitamin D deficiency given good sun protection   Actinic keratoses are precancerous spots that appear secondary to cumulative UV radiation exposure/sun exposure over time. They are chronic with expected duration over 1 year. A portion of actinic keratoses will progress to squamous cell carcinoma of the skin. It is not possible to reliably predict which spots will progress to skin cancer and so treatment is recommended to prevent development of skin cancer.  Recommend daily broad spectrum sunscreen SPF 30+ to sun-exposed areas, reapply every 2 hours as needed.  Recommend staying in the shade or wearing long sleeves, sun glasses (UVA+UVB protection) and wide brim hats (4-inch brim around the entire circumference of the hat). Call for new or changing lesions.    Cryotherapy Aftercare  Wash gently with soap and water everyday.   Apply Vaseline and Band-Aid daily until healed.     Recommend taking Heliocare sun protection supplement daily in sunny weather for additional sun protection. For maximum protection on the sunniest days, you can take up to 2 capsules of regular Heliocare OR take 1 capsule of Heliocare Ultra. For prolonged exposure (such as a full day in the sun), you can repeat your dose of the supplement 4 hours after your first dose. Heliocare can be purchased at [employer], at some [facility] or at [email or web address].     Melanoma ABCDEs  Melanoma is the most dangerous type of skin cancer, and is the leading cause of death from skin disease.  You are more likely to develop melanoma if you: Have light-colored skin, light-colored eyes, or red or blond hair Spend a lot of time in the sun Tan regularly, either outdoors or in a tanning bed Have had blistering sunburns, especially during childhood Have a close family member who has had a melanoma Have atypical moles or large birthmarks  Early detection of melanoma is key  since treatment is typically straightforward and cure rates are extremely high if we catch it early.   The first sign of melanoma is often a change in a mole or a new dark spot.  The ABCDE system is a way of remembering the signs of melanoma.  A for asymmetry:  The two halves do not match. B for border:  The edges of the growth are irregular. C for color:  A mixture of colors are present instead of an even brown color. D for diameter:  Melanomas are usually (but not always) greater than 62mm - the size of a pencil eraser. E for evolution:  The spot keeps changing in size, shape, and color.  Please check your skin once per month between visits. You can use a small mirror in front and a large mirror behind you to keep an eye on the back side or your body.   If you see any new or changing lesions before your next follow-up, please call to schedule a visit.  Please continue daily skin protection including broad spectrum sunscreen SPF 30+ to sun-exposed areas, reapplying every 2 hours as needed when you're outdoors.   Staying in the shade or wearing long sleeves, sun glasses (UVA+UVB protection) and wide brim hats (4-inch brim around the entire circumference of the hat) are also recommended for sun protection.    If You Need Anything After Your Visit  If you have any questions or concerns for your doctor, please call our main line at [phone] and press option 4 to reach your doctor's medical assistant. If  no one answers, please leave a voicemail as directed and we will return your call as soon as possible. Messages left after 4 pm will be answered the following business day.   You may also send [place] a message via [place]. We typically respond to MyChart messages within 1-2 business days.  For prescription refills, please ask your pharmacy to contact our office. Our fax number is [phone].  If you have an urgent issue when the clinic is closed that cannot wait until the next business day,  you can page your doctor at the number below.    Please note that while we do our best to be available for urgent issues outside of office hours, we are not available 24/7.   If you have an urgent issue and are unable to reach [place], you may choose to seek medical care at your doctor's office, retail clinic, urgent care center, or emergency room.  If you have a medical emergency, please immediately call 911 or go to the emergency department.  Pager Numbers  - Dr. Nehemiah Massed: [phone]  - Dr. Laurence Ferrari: [phone]  - Dr. Nicole Kindred: [phone]  In the event of inclement weather, please call our main line at [phone] for an update on the status of any delays or closures.  Dermatology Medication Tips: Please keep the boxes that topical medications come in in order to help keep track of the instructions about where and how to use these. Pharmacies typically print the medication instructions only on the boxes and not directly on the medication tubes.   If your medication is too expensive, please contact our office at [phone] option 4 or send [place] a message through [place].   We are unable to tell what your co-pay for medications will be in advance as this is different depending on your insurance coverage. However, we may be able to find a substitute medication at lower cost or fill out paperwork to get insurance to cover a needed medication.   If a prior authorization is required to get your medication covered by your insurance company, please allow [place] 1-2 business days to complete this process.  Drug prices often vary depending on where the prescription is filled and some pharmacies may offer cheaper prices.  The website www.goodrx.com contains coupons for medications through different pharmacies. The prices here do not account for what the cost may be with help from insurance (it may be cheaper with your insurance), but the website can give you the price if you did not use any insurance.   - You can print the associated coupon and take it with your prescription to the pharmacy.  - You may also stop by our office during regular business hours and pick up a GoodRx coupon card.  - If you need your prescription sent electronically to a different pharmacy, notify our office through [facility] or by phone at [phone] option 4.     Si Usted Necesita Algo Despus de Su Visita  Tambin puede enviarnos un mensaje a travs de Pharmacist, community. Por lo general respondemos a los mensajes de MyChart en el transcurso de 1 a 2 das hbiles.  Para renovar recetas, por favor pida a su farmacia que se ponga en contacto con nuestra oficina. Harland Dingwall de fax es [place] [phone].  Si tiene un asunto urgente cuando la clnica est cerrada y que no puede esperar hasta el siguiente da hbil, puede llamar/localizar a su doctor(a) al nmero que aparece a continuacin.   Por favor, tenga en  cuenta que aunque hacemos todo lo posible para estar disponibles para asuntos urgentes fuera del horario de [place], no estamos disponibles las 24 horas del da, los 7 das de la [place].   Si tiene un problema urgente y no puede comunicarse con nosotros, puede optar por buscar atencin mdica  en el consultorio de su doctor(a), en una clnica privada, en un centro de atencin urgente o en una sala de emergencias.  Si tiene Engineering geologist, por favor llame inmediatamente al 911 o vaya a la sala de emergencias.  Nmeros de bper  - Dr. Nehemiah Massed: [phone]  - Dra. Moye: [phone]  - Dra. Nicole Kindred: [phone]  En caso de inclemencias del [place], por favor llame a Johnsie Kindred principal al [phone] para una actualizacin sobre el [place] de cualquier retraso o cierre.  Consejos para la medicacin en dermatologa: Por favor, guarde las cajas en las que vienen los medicamentos de uso tpico para ayudarle a seguir las instrucciones sobre dnde y cmo usarlos. Las farmacias generalmente  imprimen las instrucciones del medicamento slo en las cajas y no directamente en los tubos del [place].   Si su medicamento es muy caro, por favor, pngase en contacto con Zigmund Daniel llamando al [phone] y presione la opcin 4 o envenos un mensaje a travs de Pharmacist, community.   No podemos decirle cul ser su copago por los medicamentos por adelantado ya que esto es diferente dependiendo de la cobertura de su seguro. Sin embargo, es posible que podamos encontrar un medicamento sustituto a Electrical engineer un formulario para que el seguro cubra el medicamento que se considera necesario.   Si se requiere una autorizacin previa para que su compaa de seguros [place] su medicamento, por favor permtanos de 1 a 2 das hbiles para completar este proceso.  Los precios de los medicamentos varan con frecuencia dependiendo del Environmental consultant de dnde se surte la receta y alguna farmacias pueden ofrecer precios ms baratos.  El sitio web www.goodrx.com tiene cupones para medicamentos de Airline pilot. Los precios aqu no tienen en cuenta lo que podra costar con la ayuda del seguro (puede ser ms barato con su seguro), pero el sitio web puede darle el precio si no utiliz Research scientist (physical sciences).  - Puede imprimir el cupn correspondiente y llevarlo con su receta a la farmacia.  - Tambin puede pasar por nuestra oficina durante el horario de atencin regular y Charity fundraiser una tarjeta de cupones de GoodRx.  - Si necesita que su receta se enve electrnicamente a una farmacia diferente, informe a nuestra oficina a travs de MyChart de  o por telfono llamando al [phone] y presione la opcin 4.

## 2021-07-23 DIAGNOSIS — M5416 Radiculopathy, lumbar region: Secondary | ICD-10-CM | POA: Diagnosis not present

## 2021-07-25 ENCOUNTER — Encounter: Payer: Self-pay | Admitting: Dermatology

## 2021-08-07 DIAGNOSIS — M5441 Lumbago with sciatica, right side: Secondary | ICD-10-CM | POA: Diagnosis not present

## 2021-08-07 DIAGNOSIS — M25551 Pain in right hip: Secondary | ICD-10-CM | POA: Diagnosis not present

## 2021-08-07 DIAGNOSIS — Z9889 Other specified postprocedural states: Secondary | ICD-10-CM | POA: Diagnosis not present

## 2021-08-07 DIAGNOSIS — G8929 Other chronic pain: Secondary | ICD-10-CM | POA: Diagnosis not present

## 2021-08-07 DIAGNOSIS — M48061 Spinal stenosis, lumbar region without neurogenic claudication: Secondary | ICD-10-CM | POA: Diagnosis not present

## 2021-08-13 ENCOUNTER — Telehealth: Payer: Self-pay | Admitting: Internal Medicine

## 2021-08-13 NOTE — Telephone Encounter (Signed)
Called patient for pre visit planning no answer. LMTCB

## 2021-08-14 ENCOUNTER — Ambulatory Visit: Payer: BC Managed Care – PPO | Admitting: Internal Medicine

## 2021-08-14 ENCOUNTER — Other Ambulatory Visit: Payer: Self-pay

## 2021-08-14 DIAGNOSIS — D72819 Decreased white blood cell count, unspecified: Secondary | ICD-10-CM | POA: Diagnosis not present

## 2021-08-14 DIAGNOSIS — R351 Nocturia: Secondary | ICD-10-CM

## 2021-08-14 DIAGNOSIS — D696 Thrombocytopenia, unspecified: Secondary | ICD-10-CM | POA: Diagnosis not present

## 2021-08-14 DIAGNOSIS — E538 Deficiency of other specified B group vitamins: Secondary | ICD-10-CM

## 2021-08-14 DIAGNOSIS — M5441 Lumbago with sciatica, right side: Secondary | ICD-10-CM

## 2021-08-14 DIAGNOSIS — Z1159 Encounter for screening for other viral diseases: Secondary | ICD-10-CM | POA: Diagnosis not present

## 2021-08-14 DIAGNOSIS — Z86010 Personal history of colonic polyps: Secondary | ICD-10-CM

## 2021-08-14 DIAGNOSIS — N138 Other obstructive and reflux uropathy: Secondary | ICD-10-CM

## 2021-08-14 DIAGNOSIS — N401 Enlarged prostate with lower urinary tract symptoms: Secondary | ICD-10-CM

## 2021-08-14 DIAGNOSIS — E559 Vitamin D deficiency, unspecified: Secondary | ICD-10-CM

## 2021-08-14 DIAGNOSIS — Z114 Encounter for screening for human immunodeficiency virus [HIV]: Secondary | ICD-10-CM

## 2021-08-14 DIAGNOSIS — I77810 Thoracic aortic ectasia: Secondary | ICD-10-CM

## 2021-08-14 LAB — CBC WITH DIFFERENTIAL/PLATELET
Basophils Absolute: 0 10*3/uL (ref 0.0–0.1)
Basophils Relative: 0.9 % (ref 0.0–3.0)
Eosinophils Absolute: 0.1 10*3/uL (ref 0.0–0.7)
Eosinophils Relative: 2.1 % (ref 0.0–5.0)
HCT: 41.9 % (ref 39.0–52.0)
Hemoglobin: 14.1 g/dL (ref 13.0–17.0)
Lymphocytes Relative: 26.3 % (ref 12.0–46.0)
Lymphs Abs: 1.1 10*3/uL (ref 0.7–4.0)
MCHC: 33.6 g/dL (ref 30.0–36.0)
MCV: 88.2 fl (ref 78.0–100.0)
Monocytes Absolute: 0.3 10*3/uL (ref 0.1–1.0)
Monocytes Relative: 8.4 % (ref 3.0–12.0)
Neutro Abs: 2.6 10*3/uL (ref 1.4–7.7)
Neutrophils Relative %: 62.3 % (ref 43.0–77.0)
Platelets: 180 10*3/uL (ref 150.0–400.0)
RBC: 4.76 Mil/uL (ref 4.22–5.81)
RDW: 12.2 % (ref 11.5–15.5)
WBC: 4.1 10*3/uL (ref 4.0–10.5)

## 2021-08-14 LAB — COMPREHENSIVE METABOLIC PANEL
ALT: 14 U/L (ref 0–53)
AST: 16 U/L (ref 0–37)
Albumin: 4.4 g/dL (ref 3.5–5.2)
Alkaline Phosphatase: 54 U/L (ref 39–117)
BUN: 35 mg/dL — ABNORMAL HIGH (ref 6–23)
CO2: 28 mEq/L (ref 19–32)
Calcium: 9.3 mg/dL (ref 8.4–10.5)
Chloride: 107 mEq/L (ref 96–112)
Creatinine, Ser: 1.03 mg/dL (ref 0.40–1.50)
GFR: 82.39 mL/min (ref >60.00)
Glucose, Bld: 89 mg/dL (ref 70–99)
Potassium: 3.9 mEq/L (ref 3.5–5.1)
Sodium: 142 mEq/L (ref 135–145)
Total Bilirubin: 0.9 mg/dL (ref 0.2–1.2)
Total Protein: 6.5 g/dL (ref 6.0–8.3)

## 2021-08-14 LAB — LIPID PANEL
Cholesterol: 160 mg/dL (ref 0–200)
HDL: 56 mg/dL (ref >39.00)
LDL Cholesterol: 94 mg/dL (ref 0–99)
NonHDL: 103.75
Total CHOL/HDL Ratio: 3
Triglycerides: 49 mg/dL (ref 0.0–149.0)
VLDL: 9.8 mg/dL (ref 0.0–40.0)

## 2021-08-14 LAB — VITAMIN B12: Vitamin B-12: 182 pg/mL — ABNORMAL LOW (ref 211–911)

## 2021-08-14 MED ORDER — IBUPROFEN 800 MG PO TABS: 800.0000 mg | ORAL_TABLET | Freq: Three times a day (TID) | ORAL | 0 refills | Status: DC | PRN

## 2021-08-14 NOTE — Progress Notes (Addendum)
Patient ID: Paul Pruitt, male   DOB: 1965-07-25, 56 y.o.   MRN: [id number]   Subjective:    Patient ID: Paul Pruitt, male    DOB: 12/18/1965, 56 y.o.   MRN: [id number]  This visit occurred during the SARS-CoV-2 public health emergency.  Safety protocols were in place, including screening questions prior to the visit, additional usage of staff PPE, and extensive cleaning of exam room while observing appropriate contact time as indicated for disinfecting solutions.   Patient here for a scheduled follow up.   Chief Complaint  Patient presents with   Hyperlipidemia   Back Pain    Degenerative disc causing pain last 4 month has cortisone injections ask ing for refill on 800 mg ibuprofen    .   Hyperlipidemia Pertinent negatives include no chest pain, myalgias or shortness of breath.  Back Pain Pertinent negatives include no abdominal pain, chest pain, dysuria or headaches.  Persistent - right side low back pain and right side sciatica.  Seeing Dr Luciana Axe.  Planning for [facility] 08/16/21.  He is still exercising.  Unable to run.  Worse in AM.  Taking ibuprofen - 1-3x/day.  Some occasional nausea.  Does not report as a significant problem.  No chest pain.  Breathing stable.  No increased cough or congestion.  No abdominal pain.  Bowels moving.  Previously axillary lesion - resolved.  Discussed f/u with cardiology regarding ECHO.     Past Medical History:  Diagnosis Date   Dysplastic nevus 04/09/2015   left post lateral deltoid, mild atypia    Dysplastic nevus 05/17/2014   right prox med thigh, mild atypia    Dysplastic nevus 08/22/2008   right post shoulder, slight atypia    Dysplastic nevus 01/04/2007   R sup post lat thigh, slight atypia    Dysplastic nevus 09/15/2006   left post waistline paraspinal, severe atypia    Dysplastic nevus 07/09/2006   Left lower back, slight atypia    Dysplastic nevus 07/09/2006   right ear, moderate atypia    Melanoma ([place]) 1995   L pectoral    Past  Surgical History:  Procedure Laterality Date   BACK SURGERY N/A 2004, 2005   L3, 4 and L4,5   CATARACT EXTRACTION, BILATERAL     08/12/16 and 08/19/16.   COLONOSCOPY  02/07/2016   By dr Carlisle Beers polyps, internal hemorrhoids, waiting path report   HERNIA REPAIR     LAMINECTOMY     x 2   MELANOMA EXCISION     chest area   TONSILLECTOMY     Family History  Problem Relation Age of Onset   Arthritis Mother    Colon cancer Mother    Hypertension Father    Asthma Father    Hypertension Brother    Obesity Brother    Asthma Son    Obesity Son    Kidney disease Neg Hx    Prostate cancer Neg Hx    Social History   Socioeconomic History   Marital status: Married    Spouse name: Not on file   Number of children: Not on file   Years of education: Not on file   Highest education level: Not on file  Occupational History   Not on file  Tobacco Use   Smoking status: Never   Smokeless tobacco: Never  Vaping Use   Vaping Use: Never used  Substance and Sexual Activity   Alcohol use: Not Currently    Alcohol/week: 0.0 standard drinks  Comment: seldomly-3 drinks a year maybe   Drug use: No   Sexual activity: Yes    Birth control/protection: None  Other Topics Concern   Not on file  Social History Narrative   Patient states heavy exercise. 7 days per week x 30-90 minutes.   Social Determinants of Health   Financial Resource Strain: Not on file  Food Insecurity: Not on file  Transportation Needs: Not on file  Physical Activity: Not on file  Stress: Not on file  Social Connections: Not on file     Review of Systems  Constitutional:  Negative for appetite change and unexpected weight change.  HENT:  Negative for congestion and sinus pressure.   Respiratory:  Negative for cough, chest tightness and shortness of breath.   Cardiovascular:  Negative for chest pain, palpitations and leg swelling.  Gastrointestinal:  Negative for abdominal pain, diarrhea, nausea and vomiting.   Genitourinary:  Negative for difficulty urinating and dysuria.  Musculoskeletal:  Positive for back pain. Negative for joint swelling and myalgias.  Neurological:  Negative for dizziness, light-headedness and headaches.  Psychiatric/Behavioral:  Negative for agitation and dysphoric mood.       Objective:     BP 122/76    Pulse 71    Temp 97.9 F (36.6 C)    Resp 16    Ht _0  (1.93 m)    Wt 199 lb 9.6 oz (90.5 kg)    SpO2 98%    BMI 24.30 kg/m  Wt Readings from Last 3 Encounters:  08/14/21 199 lb 9.6 oz (90.5 kg)  02/11/21 199 lb (90.3 kg)  06/26/20 203 lb (92.1 kg)    Physical Exam Constitutional:      General: He is not in acute distress.    Appearance: Normal appearance. He is well-developed.  HENT:     Head: Normocephalic and atraumatic.     Right Ear: External ear normal.     Left Ear: External ear normal.  Eyes:     General: No scleral icterus.       Right eye: No discharge.        Left eye: No discharge.  Cardiovascular:     Rate and Rhythm: Normal rate and regular rhythm.  Pulmonary:     Effort: Pulmonary effort is normal. No respiratory distress.     Breath sounds: Normal breath sounds.  Abdominal:     General: Bowel sounds are normal.     Palpations: Abdomen is soft.     Tenderness: There is no abdominal tenderness.  Musculoskeletal:        General: No swelling or tenderness.     Cervical back: Neck supple. No tenderness.  Lymphadenopathy:     Cervical: No cervical adenopathy.  Skin:    Findings: No erythema or rash.  Neurological:     Mental Status: He is alert.  Psychiatric:        Mood and Affect: Mood normal.        Behavior: Behavior normal.     Outpatient Encounter Medications as of 08/14/2021  Medication Sig   ibuprofen (ADVIL) 800 MG tablet Take 1 tablet (800 mg total) by mouth every 8 (eight) hours as needed. With food   [DISCONTINUED] ibuprofen (ADVIL) 800 MG tablet Take 1 tablet by mouth 3 (three) times daily. With food   No  facility-administered encounter medications on file as of 08/14/2021.     Lab Results  Component Value Date   WBC 4.1 08/14/2021   HGB 14.1 08/14/2021  HCT 41.9 08/14/2021   PLT 180.0 08/14/2021   GLUCOSE 89 08/14/2021   CHOL 160 08/14/2021   TRIG 49.0 08/14/2021   HDL 56.00 08/14/2021   LDLCALC 94 08/14/2021   ALT 14 08/14/2021   AST 16 08/14/2021   NA 142 08/14/2021   K 3.9 08/14/2021   CL 107 08/14/2021   CREATININE 1.03 08/14/2021   BUN 35 (H) 08/14/2021   CO2 28 08/14/2021   TSH 3.34 02/11/2021   PSA 0.54 02/11/2021       Assessment & Plan:   Problem List Items Addressed This Visit     Aortic root dilatation (HCC)    Seeing Dr Saralyn Pilar.  2D echocardiogram revealed normal left ventricular function, trivial valvular insufficiencies, with mild aortic root dilatation of 4.3 cm. Recommended f/u in one year. Discussed with cardiology - planning for f/u in 12/2021.        B12 deficiency    Recheck B12 level.        Back pain    With persistent low back (right) with right leg pain.  Seeing Dr Luciana Axe.  Planning for [facility] 08/16/21.  Taking ibuprofen as outlined.  Discussed possible side effects and risk of ibuprofen.  Discussed taking with food.  Monitor for GI side effects.  Check renal function.  Hopefully will not need after ESI.  Refilled today.  Follow.        Relevant Medications   ibuprofen (ADVIL) 800 MG tablet   BPH with obstruction/lower urinary tract symptoms    Previously had tried flomax.  Did not feel made a difference.  Nocturia. Discussed modifying increased fluid intake prior to bed, etc.  Consider referral to urology. Will monitor. Notify me if desires referral.       Hx of colonic polyp    Per record, colonoscopy 07/12/21.  Need report.        Nocturia    See above.  Will notify me if desires urology referral.  Discussed adjusting increased fluid intake prior to bed.  Discussed possible etiologies.  Desires to follow.        Thrombocytopenia ([place])     Will check cbc today to confirm wnl.       Relevant Orders   CBC w/Diff (Completed)   Vitamin D deficiency    Follow vitamin D level.        Other Visit Diagnoses     Hyperbilirubinemia       Relevant Orders   Comp Met (CMET) (Completed)   Lipid panel (Completed)   Leukopenia, unspecified type       Relevant Orders   CBC w/Diff (Completed)   Vitamin B 12 deficiency       Relevant Orders   B12 (Completed)   Encounter for hepatitis C screening test for low risk patient       Relevant Orders   HIV antibody (with reflex)   Hepatitis C Antibody   Screening for HIV without presence of risk factors            Einar Pheasant, MD

## 2021-08-15 ENCOUNTER — Encounter: Payer: Self-pay | Admitting: Internal Medicine

## 2021-08-15 ENCOUNTER — Telehealth: Payer: Self-pay | Admitting: Internal Medicine

## 2021-08-15 DIAGNOSIS — M549 Dorsalgia, unspecified: Secondary | ICD-10-CM | POA: Insufficient documentation

## 2021-08-15 DIAGNOSIS — I77810 Thoracic aortic ectasia: Secondary | ICD-10-CM | POA: Insufficient documentation

## 2021-08-15 LAB — HEPATITIS C ANTIBODY
Hepatitis C Ab: NONREACTIVE
SIGNAL TO CUT-OFF: 0.02 (ref <1.00)

## 2021-08-15 LAB — HIV ANTIBODY (ROUTINE TESTING W REFLEX): HIV 1&2 Ab, 4th Generation: NONREACTIVE

## 2021-08-15 NOTE — Assessment & Plan Note (Addendum)
See above.  Will notify me if desires urology referral.  Discussed adjusting increased fluid intake prior to bed.  Discussed possible etiologies.  Desires to follow.   ?

## 2021-08-15 NOTE — Telephone Encounter (Signed)
Spoke to patient and informed him to let us know if he had not heard back from Cardiology before 12/2021 ?

## 2021-08-15 NOTE — Assessment & Plan Note (Signed)
With persistent low back (right) with right leg pain.  Seeing Dr Luciana Axe.  Planning for [facility] 08/16/21.  Taking ibuprofen as outlined.  Discussed possible side effects and risk of ibuprofen.  Discussed taking with food.  Monitor for GI side effects.  Check renal function.  Hopefully will not need after ESI.  Refilled today.  Follow.   ?

## 2021-08-15 NOTE — Assessment & Plan Note (Signed)
Recheck B12 level 

## 2021-08-15 NOTE — Telephone Encounter (Signed)
Please notify Paul Pruitt that I spoke to cardiology and they informed me they have him in a call back for 12/2021.  Let us know if does not hear anything.  Also, need copy of colonoscopy report. Per record - had 07/12/21.  Need to document if had covid vaccine.  Thanks   ?

## 2021-08-15 NOTE — Assessment & Plan Note (Signed)
Follow vitamin D level.  

## 2021-08-15 NOTE — Assessment & Plan Note (Signed)
Will check cbc today to confirm wnl.  ?

## 2021-08-15 NOTE — Assessment & Plan Note (Signed)
Previously had tried flomax.  Did not feel made a difference.  Nocturia. Discussed modifying increased fluid intake prior to bed, etc.  Consider referral to urology. Will monitor. Notify me if desires referral.  ?

## 2021-08-15 NOTE — Assessment & Plan Note (Signed)
Per record, colonoscopy 07/12/21.  Need report.   ?

## 2021-08-15 NOTE — Telephone Encounter (Signed)
LMTCB

## 2021-08-15 NOTE — Assessment & Plan Note (Signed)
Seeing Dr Saralyn Pilar.  2D echocardiogram revealed normal left ventricular function, trivial valvular insufficiencies, with mild aortic root dilatation of 4.3 cm. Recommended f/u in one year. Discussed with cardiology - planning for f/u in 12/2021.  ? ?

## 2021-08-16 ENCOUNTER — Telehealth: Payer: Self-pay

## 2021-08-16 DIAGNOSIS — M5416 Radiculopathy, lumbar region: Secondary | ICD-10-CM | POA: Diagnosis not present

## 2021-08-16 NOTE — Telephone Encounter (Signed)
LM FOR PT TO CB RE : LOW B12 needs to schedule injections ?

## 2021-08-16 NOTE — Telephone Encounter (Signed)
-----   Message from Einar Pheasant, MD sent at 08/15/2021  4:45 AM EST ----- ?Notify Paul Pruitt that his cholesterol levels look good.  B12 level is low.  Needs to start B12 injections - 1033mcg q week x 4 weeks and then 1032mcg q month.  Hgb, kidney function tests and liver function tests are ok.  ?

## 2021-08-19 ENCOUNTER — Encounter: Payer: Self-pay | Admitting: Internal Medicine

## 2021-08-22 ENCOUNTER — Other Ambulatory Visit: Payer: Self-pay

## 2021-08-22 ENCOUNTER — Ambulatory Visit (INDEPENDENT_AMBULATORY_CARE_PROVIDER_SITE_OTHER): Payer: BC Managed Care – PPO

## 2021-08-22 DIAGNOSIS — E538 Deficiency of other specified B group vitamins: Secondary | ICD-10-CM

## 2021-08-22 MED ORDER — CYANOCOBALAMIN 1000 MCG/ML IJ SOLN
1000.0000 ug | Freq: Once | INTRAMUSCULAR | Status: AC
  Administered 2021-08-22: 11:00:00 1000 ug via INTRAMUSCULAR

## 2021-08-22 NOTE — Progress Notes (Signed)
Patient presented for B 12 injection to left deltoid, patient voiced no concerns nor showed any signs of distress during injection. 

## 2021-08-29 ENCOUNTER — Ambulatory Visit (INDEPENDENT_AMBULATORY_CARE_PROVIDER_SITE_OTHER): Payer: BC Managed Care – PPO

## 2021-08-29 ENCOUNTER — Other Ambulatory Visit: Payer: Self-pay

## 2021-08-29 DIAGNOSIS — E538 Deficiency of other specified B group vitamins: Secondary | ICD-10-CM | POA: Diagnosis not present

## 2021-08-29 MED ORDER — CYANOCOBALAMIN 1000 MCG/ML IJ SOLN
1000.0000 ug | Freq: Once | INTRAMUSCULAR | Status: AC
  Administered 2021-08-29: 1000 ug via INTRAMUSCULAR

## 2021-08-29 NOTE — Progress Notes (Signed)
Patient presented for B 12 injection to right deltoid, patient voiced no concerns nor showed any signs of distress during injection. 

## 2021-09-05 ENCOUNTER — Ambulatory Visit (INDEPENDENT_AMBULATORY_CARE_PROVIDER_SITE_OTHER): Payer: BC Managed Care – PPO

## 2021-09-05 ENCOUNTER — Other Ambulatory Visit: Payer: Self-pay

## 2021-09-05 DIAGNOSIS — E538 Deficiency of other specified B group vitamins: Secondary | ICD-10-CM

## 2021-09-05 MED ORDER — CYANOCOBALAMIN 1000 MCG/ML IJ SOLN
1000.0000 ug | Freq: Once | INTRAMUSCULAR | Status: AC
  Administered 2021-09-05: 1000 ug via INTRAMUSCULAR

## 2021-09-05 NOTE — Progress Notes (Signed)
Pt arrived for B12 injection, given in L deltoid. Pt tolerated injection well, showed no signs of distress nor voiced any concerns.  ?

## 2021-09-12 ENCOUNTER — Ambulatory Visit (INDEPENDENT_AMBULATORY_CARE_PROVIDER_SITE_OTHER): Payer: BC Managed Care – PPO

## 2021-09-12 DIAGNOSIS — E538 Deficiency of other specified B group vitamins: Secondary | ICD-10-CM | POA: Diagnosis not present

## 2021-09-12 MED ORDER — CYANOCOBALAMIN 1000 MCG/ML IJ SOLN: 1000.0000 ug | Freq: Once | INTRAMUSCULAR | Status: DC

## 2021-09-12 NOTE — Progress Notes (Signed)
Pt arrived for 4/4 B12 injection, given in L deltoid. Pt tolerated injection well, showed no signs of distress nor voiced any concerns.  ? ?Pt advised to stop at checkout to scheduled next B12 injection in 1 month. Pt verbalized understanding.  ?

## 2021-09-24 ENCOUNTER — Ambulatory Visit: Payer: BC Managed Care – PPO | Admitting: Anesthesiology

## 2021-10-16 ENCOUNTER — Ambulatory Visit (INDEPENDENT_AMBULATORY_CARE_PROVIDER_SITE_OTHER): Payer: BC Managed Care – PPO

## 2021-10-16 DIAGNOSIS — E538 Deficiency of other specified B group vitamins: Secondary | ICD-10-CM

## 2021-10-16 MED ORDER — CYANOCOBALAMIN 1000 MCG/ML IJ SOLN
1000.0000 ug | Freq: Once | INTRAMUSCULAR | Status: AC
  Administered 2021-10-16: 1000 ug via INTRAMUSCULAR

## 2021-10-16 NOTE — Progress Notes (Signed)
Patient presented for B 12 injection to left deltoid, patient voiced no concerns nor showed any signs of distress during injection. ? ?Patient also wanted to know if he needed labs to check B-12 again after he had the injections for one month or if he needed the labs to see if he was absorbing B-12 properly. He would like to do injections at home monthly if he does need to continue & his wife can administer since she is a Therapist, sports. Would that be okay? ?

## 2021-10-17 ENCOUNTER — Telehealth: Payer: Self-pay

## 2021-10-17 NOTE — Telephone Encounter (Signed)
LMTCB to let patient know that Dr. Nicki Reaper was okay with patient having wife give B-12 injections. She also wanted patient to continue them. Needed to verify what pharmacy he wanted script sent to as well. ?

## 2021-10-26 ENCOUNTER — Emergency Department
Admission: EM | Admit: 2021-10-26 | Discharge: 2021-10-26 | Disposition: A | Discharge status: Home / Self Care | Payer: BC Managed Care – PPO | Attending: Emergency Medicine | Admitting: Emergency Medicine

## 2021-10-26 ENCOUNTER — Other Ambulatory Visit: Payer: Self-pay

## 2021-10-26 ENCOUNTER — Emergency Department: Payer: BC Managed Care – PPO

## 2021-10-26 ENCOUNTER — Encounter: Payer: Self-pay | Admitting: Emergency Medicine

## 2021-10-26 DIAGNOSIS — S92354A Nondisplaced fracture of fifth metatarsal bone, right foot, initial encounter for closed fracture: Secondary | ICD-10-CM | POA: Diagnosis not present

## 2021-10-26 DIAGNOSIS — Y9301 Activity, walking, marching and hiking: Secondary | ICD-10-CM | POA: Diagnosis not present

## 2021-10-26 DIAGNOSIS — X500XXA Overexertion from strenuous movement or load, initial encounter: Secondary | ICD-10-CM | POA: Diagnosis not present

## 2021-10-26 DIAGNOSIS — S99921A Unspecified injury of right foot, initial encounter: Secondary | ICD-10-CM | POA: Diagnosis not present

## 2021-10-26 DIAGNOSIS — Y92015 Private garage of single-family (private) house as the place of occurrence of the external cause: Secondary | ICD-10-CM | POA: Insufficient documentation

## 2021-10-26 MED ORDER — ONDANSETRON 4 MG PO TBDP: 4.0000 mg | ORAL_TABLET | Freq: Three times a day (TID) | ORAL | 0 refills | Status: DC | PRN

## 2021-10-26 MED ORDER — OXYCODONE-ACETAMINOPHEN 5-325 MG PO TABS: 1.0000 | ORAL_TABLET | Freq: Four times a day (QID) | ORAL | 0 refills | Status: DC | PRN

## 2021-10-26 MED ORDER — OXYCODONE-ACETAMINOPHEN 5-325 MG PO TABS
1.0000 | ORAL_TABLET | Freq: Once | ORAL | Status: AC
  Administered 2021-10-26: 1 via ORAL
  Filled 2021-10-26: qty 1

## 2021-10-26 MED ORDER — KETOROLAC TROMETHAMINE 10 MG PO TABS: 10.0000 mg | ORAL_TABLET | Freq: Four times a day (QID) | ORAL | 0 refills | Status: DC | PRN

## 2021-10-26 MED ORDER — KETOROLAC TROMETHAMINE 30 MG/ML IJ SOLN
30.0000 mg | Freq: Once | INTRAMUSCULAR | Status: AC
  Administered 2021-10-26: 30 mg via INTRAMUSCULAR
  Filled 2021-10-26: qty 1

## 2021-10-26 MED ORDER — ONDANSETRON 8 MG PO TBDP
8.0000 mg | ORAL_TABLET | Freq: Once | ORAL | Status: AC
  Administered 2021-10-26: 8 mg via ORAL
  Filled 2021-10-26: qty 1

## 2021-10-26 NOTE — ED Provider Notes (Signed)
? ?[facility] ?Provider Note ? ?Patient Contact: 7:15 PM (approximate) ? ? ?History  ? ?Foot Injury ? ? ?HPI ? ?Paul Pruitt is a 56 y.o. male who presents the emergency department complaining of right foot injury.  Patient was going up the steps from his garage to his laundry room when he missed a step rolling his ankle.  He states that his ankle does not hurt but he is having pain to the lateral aspect of the foot.  Patient denies any open wounds.  No history of previous injuries.  He states that he is very active and runs every day. ?  ? ? ?Physical Exam  ? ?Triage Vital Signs: ?ED Triage Vitals  ?Enc Vitals Group  ?   BP 10/26/21 1826 (!) 139/95  ?   Pulse Rate 10/26/21 1826 64  ?   Resp 10/26/21 1826 18  ?   Temp 10/26/21 1826 98.4 ?F (36.9 ?C)  ?   Temp Source 10/26/21 1826 Oral  ?   SpO2 10/26/21 1826 100 %  ?   Weight 10/26/21 1819 200 lb (90.7 kg)  ?   Height 10/26/21 1819 '6\' 4"'$  (1.93 m)  ?   Head Circumference --   ?   Peak Flow --   ?   Pain Score 10/26/21 1819 8  ?   Pain Loc --   ?   Pain Edu? --   ?   Excl. in [place]? --   ? ? ?Most recent vital signs: ?Vitals:  ? 10/26/21 1826  ?BP: (!) 139/95  ?Pulse: 64  ?Resp: 18  ?Temp: 98.4 ?F (36.9 ?C)  ?SpO2: 100%  ? ? ? ?General: Alert and in no acute distress.  ?Cardiovascular:  Good peripheral perfusion ?Respiratory: Normal respiratory effort without tachypnea or retractions. Lungs CTAB.  ?Musculoskeletal: Full range of motion to all extremities.  Visualization of the right foot reveals mild edema about the lateral foot roughly over the proximal fifth metatarsal and fourth metatarsal range.  Patient is tender in this region.  No palpable abnormality.  Pulse and sensation intact to the foot. ?Neurologic:  No gross focal neurologic deficits are appreciated.  ?Skin:   No rash noted ?Other: ? ? ?ED Results / Procedures / Treatments  ? ?Labs ?(all labs ordered are listed, but only abnormal results are displayed) ?Labs Reviewed - No data to  display ? ? ?EKG ? ? ? ? ?RADIOLOGY ? ?I personally viewed, evaluated, and interpreted these images as part of my medical decision making, as well as reviewing the written report by the radiologist. ? ?ED Provider Interpretation: Fracture of the fifth metatarsal, nondisplaced ? ?DG Foot Complete Right ? ?Result Date: 10/26/2021 ?CLINICAL DATA:  Right foot injury, pain with ambulation at base of fifth digit EXAM: RIGHT FOOT COMPLETE - 3+ VIEW COMPARISON:  05/01/2014 FINDINGS: Frontal, oblique, and lateral views of the right foot are obtained. There is a nondisplaced transverse fracture at the base of the fifth metatarsal. No other acute bony abnormalities. Joint spaces are well preserved. Soft tissues are unremarkable. IMPRESSION: 1. Nondisplaced fracture at the base of the fifth metatarsal. Electronically Signed   By: Randa Ngo M.D.   On: 10/26/2021 18:48   ? ?PROCEDURES: ? ?Critical Care performed: No ? ?Procedures ? ? ?MEDICATIONS ORDERED IN ED: ?Medications  ?ketorolac (TORADOL) 30 MG/ML injection 30 mg (30 mg Intramuscular Given 10/26/21 1949)  ?oxyCODONE-acetaminophen (PERCOCET/ROXICET) 5-325 MG per tablet 1 tablet (1 tablet Oral Given 10/26/21 1956)  ?ondansetron (  ZOFRAN-ODT) disintegrating tablet 8 mg (8 mg Oral Given 10/26/21 1956)  ? ? ? ?IMPRESSION / MDM / ASSESSMENT AND PLAN / ED COURSE  ?I reviewed the triage vital signs and the nursing notes. ?             ?               ? ?Differential diagnosis includes, but is not limited to, sprain of the foot, foot fracture, ankle dislocation, ligamentous injury ? ? ?Patient's diagnosis is consistent with fifth metatarsal fracture.  Patient presented to the emergency department after sustaining an injury to the right foot.  He does have a fracture through the base of the fifth metatarsal.  There is no displacement at this time.  Patiently placed in a walking boot.  I will provide a postop shoe as well for convenience.  Patient received Toradol, pain medication  here in the emergency department.  We will send in prescription for Percocet, Toradol tabs at home.  Follow-up with podiatry.  Patient is given ED precautions to return to the ED for any worsening or new symptoms. ? ? ?  ? ? ?FINAL CLINICAL IMPRESSION(S) / ED DIAGNOSES  ? ?Final diagnoses:  ?Closed nondisplaced fracture of fifth metatarsal bone of right foot, initial encounter  ? ? ? ?Rx / DC Orders  ? ?ED Discharge Orders   ? ?      Ordered  ?  oxyCODONE-acetaminophen (PERCOCET/ROXICET) 5-325 MG tablet  Every 6 hours PRN       ? 10/26/21 1957  ?  ondansetron (ZOFRAN-ODT) 4 MG disintegrating tablet  Every 8 hours PRN       ? 10/26/21 1957  ?  ketorolac (TORADOL) 10 MG tablet  Every 6 hours PRN       ? 10/26/21 1957  ? ?  ?  ? ?  ? ? ? ?Note:  This document was prepared using Dragon voice recognition software and may include unintentional dictation errors. ?  ?Darletta Moll, PA-C ?10/26/21 1958 ? ?  ?Rada Hay, MD ?10/27/21 1106 ? ?

## 2021-10-26 NOTE — ED Triage Notes (Signed)
Pt reports stepped on a stair and felt a pop in his right foot and has had pain walking since then. ?

## 2021-10-26 NOTE — ED Notes (Signed)
Pt gives verbal consent to Dc ? ?

## 2021-11-04 ENCOUNTER — Encounter: Payer: Self-pay | Admitting: Podiatry

## 2021-11-04 ENCOUNTER — Ambulatory Visit (INDEPENDENT_AMBULATORY_CARE_PROVIDER_SITE_OTHER): Payer: BC Managed Care – PPO

## 2021-11-04 ENCOUNTER — Ambulatory Visit: Payer: BC Managed Care – PPO | Admitting: Podiatry

## 2021-11-04 DIAGNOSIS — S92354A Nondisplaced fracture of fifth metatarsal bone, right foot, initial encounter for closed fracture: Secondary | ICD-10-CM

## 2021-11-04 NOTE — Progress Notes (Signed)
Subjective:  Patient ID: Paul Pruitt, male    DOB: [birth year],  MRN: [id number] HPI Chief Complaint  Patient presents with   Foot Injury    5th met fracture right - missed a step and foot twisted on May 13th, had xrayed and put in a boot during walking hours and a shoe to sleep in, goes on a cruise June 17th-would like to be out of the boot   New Patient (Initial Visit)    Est pt 2019    56 y.o. male presents with the above complaint.   ROS: Denies fever chills nausea vomit muscle aches pains calf pain back pain chest pain shortness of breath.  Past Medical History:  Diagnosis Date   Dysplastic nevus 04/09/2015   left post lateral deltoid, mild atypia    Dysplastic nevus 05/17/2014   right prox med thigh, mild atypia    Dysplastic nevus 08/22/2008   right post shoulder, slight atypia    Dysplastic nevus 01/04/2007   R sup post lat thigh, slight atypia    Dysplastic nevus 09/15/2006   left post waistline paraspinal, severe atypia    Dysplastic nevus 07/09/2006   Left lower back, slight atypia    Dysplastic nevus 07/09/2006   right ear, moderate atypia    Melanoma ([place]) 1995   L pectoral    Past Surgical History:  Procedure Laterality Date   BACK SURGERY N/A 2004, 2005   L3, 4 and L4,5   CATARACT EXTRACTION, BILATERAL     08/12/16 and 08/19/16.   COLONOSCOPY  02/07/2016   By dr Carlisle Beers polyps, internal hemorrhoids, waiting path report   HERNIA REPAIR     LAMINECTOMY     x 2   MELANOMA EXCISION     chest area   TONSILLECTOMY      Current Outpatient Medications:    gabapentin (NEURONTIN) 300 MG capsule, Take 300 mg by mouth 3 (three) times daily., Disp: , Rfl:    ibuprofen (ADVIL) 800 MG tablet, Take 1 tablet (800 mg total) by mouth every 8 (eight) hours as needed. With food, Disp: 30 tablet, Rfl: 0   ketorolac (TORADOL) 10 MG tablet, Take 1 tablet (10 mg total) by mouth every 6 (six) hours as needed., Disp: 20 tablet, Rfl: 0   ondansetron (ZOFRAN-ODT) 4 MG  disintegrating tablet, Take 1 tablet (4 mg total) by mouth every 8 (eight) hours as needed for nausea or vomiting., Disp: 20 tablet, Rfl: 0  Current Facility-Administered Medications:    cyanocobalamin ((VITAMIN B-12)) injection 1,000 mcg, 1,000 mcg, Intramuscular, Once, Einar Pheasant, MD  No Known Allergies Review of Systems Objective:  There were no vitals filed for this visit.  General: Well developed, nourished, in no acute distress, alert and oriented x3   Dermatological: Skin is warm, dry and supple bilateral. Nails x 10 are well maintained; remaining integument appears unremarkable at this time. There are no open sores, no preulcerative lesions, no rash or signs of infection present.  Vascular: Dorsalis Pedis artery and Posterior Tibial artery pedal pulses are 2/4 bilateral with immedate capillary fill time. Pedal hair growth present. No varicosities and no lower extremity edema present bilateral.   Neruologic: Grossly intact via light touch bilateral. Vibratory intact via tuning fork bilateral. Protective threshold with Semmes Wienstein monofilament intact to all pedal sites bilateral. Patellar and Achilles deep tendon reflexes 2+ bilateral. No Babinski or clonus noted bilateral.   Musculoskeletal: No gross boney pedal deformities bilateral. No pain, crepitus, or limitation noted with foot and  ankle range of motion bilateral. Muscular strength 5/5 in all groups tested bilateral.  Direct pain on palpation  Gait: Unassisted, Nonantalgic.    Radiographs:  Radiographs taken today demonstrate an osseously mature individual he does have a fracture of the fifth metatarsal base.  I reviewed the images from his previous radiograph which demonstrates hairline fracture however now he does have some diastases transversely through the fifth met base this is a Jones fracture.  Assessment & Plan:   Assessment: Jones fracture fifth met right  Plan: Recommended he continue his cam boot.  I  recommended nonweightbearing status for the next 6 to 8 weeks to allow this heal completely.  I will follow-up with him in a few weeks prior to a cruise that they are about to embark on.     [name], [place]

## 2021-11-12 ENCOUNTER — Encounter: Payer: Self-pay | Admitting: Urology

## 2021-11-12 ENCOUNTER — Ambulatory Visit: Payer: BC Managed Care – PPO | Admitting: Urology

## 2021-11-12 VITALS — BP 134/80 | HR 66 | Ht 76.0 in | Wt 200.0 lb

## 2021-11-12 DIAGNOSIS — N401 Enlarged prostate with lower urinary tract symptoms: Secondary | ICD-10-CM

## 2021-11-12 DIAGNOSIS — G8929 Other chronic pain: Secondary | ICD-10-CM

## 2021-11-12 DIAGNOSIS — N138 Other obstructive and reflux uropathy: Secondary | ICD-10-CM

## 2021-11-12 DIAGNOSIS — R3912 Poor urinary stream: Secondary | ICD-10-CM

## 2021-11-12 DIAGNOSIS — N50812 Left testicular pain: Secondary | ICD-10-CM

## 2021-11-12 DIAGNOSIS — N50811 Right testicular pain: Secondary | ICD-10-CM

## 2021-11-12 NOTE — Patient Instructions (Signed)
Cystoscopy Cystoscopy is a procedure that is used to help diagnose and sometimes treat conditions that affect the lower urinary tract. The lower urinary tract includes the bladder and the urethra. The urethra is the tube that drains urine from the bladder. Cystoscopy is done using a thin, tube-shaped instrument with a light and camera at the end (cystoscope). The cystoscope may be hard or flexible, depending on the goal of the procedure. The cystoscope is inserted through the urethra, into the bladder. Cystoscopy may be recommended if you have: Urinary tract infections that keep coming back. Blood in the urine (hematuria). An inability to control when you urinate (urinary incontinence) or an overactive bladder. Unusual cells found in a urine sample. A blockage in the urethra, such as a urinary stone. Painful urination. An abnormality in the bladder found during an intravenous pyelogram (IVP) or CT scan. What are the risks? Generally, this is a safe procedure. However, problems may occur, including: Infection. Bleeding.  What happens during the procedure?  You will be given one or more of the following: A medicine to numb the area (local anesthetic). The area around the opening of your urethra will be cleaned. The cystoscope will be passed through your urethra into your bladder. Germ-free (sterile) fluid will flow through the cystoscope to fill your bladder. The fluid will stretch your bladder so that your health care provider can clearly examine your bladder walls. Your doctor will look at the urethra and bladder. The cystoscope will be removed The procedure may vary among health care providers  What can I expect after the procedure? After the procedure, it is common to have: Some soreness or pain in your urethra. Urinary symptoms. These include: Mild pain or burning when you urinate. Pain should stop within a few minutes after you urinate. This may last for up to a few days after the  procedure. A small amount of blood in your urine for several days. Feeling like you need to urinate but producing only a small amount of urine. Follow these instructions at home: General instructions Return to your normal activities as told by your health care provider.  Drink plenty of fluids after the procedure. Keep all follow-up visits as told by your health care provider. This is important. Contact a health care provider if you: Have pain that gets worse or does not get better with medicine, especially pain when you urinate lasting longer than 72 hours after the procedure. Have trouble urinating. Get help right away if you: Have blood clots in your urine. Have a fever or chills. Are unable to urinate. Summary Cystoscopy is a procedure that is used to help diagnose and sometimes treat conditions that affect the lower urinary tract. Cystoscopy is done using a thin, tube-shaped instrument with a light and camera at the end. After the procedure, it is common to have some soreness or pain in your urethra. It is normal to have blood in your urine after the procedure.  If you were prescribed an antibiotic medicine, take it as told by your health care provider.  This information is not intended to replace advice given to you by your health care provider. Make sure you discuss any questions you have with your health care provider. Document Revised: 05/25/2018 Document Reviewed: 05/25/2018 Elsevier Patient Education  2020 Elsevier Inc.   Transrectal Ultrasound  A transrectal ultrasound is a procedure that uses sound waves to create images of the prostate gland and nearby tissues. For this procedure, an ultrasound probe is placed   in the rectum. The probe sends sound waves through the wall of the rectum into the prostate gland. The prostate is a walnut-sized gland that is located below the bladder and in front of the rectum. The images show the size and shape of the prostate gland and nearby  structures. You may need this test if you have: Trouble urinating. Trouble getting your partner pregnant (infertility). An abnormal result from a prostate screening exam. Tell a health care provider about: Any allergies you have. All medicines you are taking, including vitamins, herbs, eye drops, creams, and over-the-counter medicines. Any bleeding problems you have. Any surgeries you have had. Any medical conditions you have. Any prostate infections you have had. What are the risks? Generally, this is a safe procedure. However, problems may occur, including: Discomfort during the procedure. Blood in your urine or sperm after the procedure. This may occur if a sample of tissue is taken to look at under a microscope (biopsy) during the procedure. What happens before the procedure? Your health care provider may instruct you to use an enema 1-4 hours before the procedure. Follow instructions from your health care provider about how to do the enema. Ask your health care provider about: Changing or stopping your regular medicines. This is especially important if you are taking diabetes medicines or blood thinners. Taking medicines such as aspirin and ibuprofen. These medicines can thin your blood. Do not take these medicines unless your health care provider tells you to take them. Taking over-the-counter medicines, vitamins, herbs, and supplements. What happens during the procedure? You will be asked to lie down on your left side on an exam table. You will bend your knees toward your chest. Gel will be put on a small probe that is about the width of a finger. The probe will be gently inserted into your rectum. You may have a feeling of fullness but should not feel pain. The probe will send signals to a computer that will create images. These will be displayed on a monitor that looks like a small television screen. The technician will slightly rotate the probe throughout the procedure. While  rotating the probe, he or she will view and capture images of the prostate gland and the surrounding structures from different angles. Your health care provider may take a biopsy sample of prostate tissue during the procedure. The images captured from the ultrasound will help guide the needle that is used to remove a sample of tissue. The sample will be sent to a lab for testing. The probe will be removed. The procedure may vary among health care providers and hospitals. What can I expect after the procedure? It is up to you to get the results of your procedure. Ask your health care provider, or the department that is doing the procedure, when your results will be ready. Keep all follow-up visits. This is important. Summary A transrectal ultrasound is a procedure that uses sound waves to create images of the prostate gland and nearby tissues. The images show the size and shape of the prostate gland and nearby structures. Before the procedure, ask your health care provider about changing or stopping your regular medicines. This is especially important if you are taking diabetes medicines or blood thinners. This information is not intended to replace advice given to you by your health care provider. Make sure you discuss any questions you have with your health care provider. Document Revised: 02/13/2021 Document Reviewed: 11/26/2020 Elsevier Patient Education  2023 Elsevier Inc.  

## 2021-11-12 NOTE — Progress Notes (Signed)
11/12/21 12:59 PM   Paul Pruitt [birth year] [id number]  Referring provider:  Einar Pheasant, [address] [place],  [place] [address] Chief Complaint  Patient presents with   Benign Prostatic Hypertrophy    HPI: Paul Pruitt is a 56 y.o.male who presents today for further evaluation of BPH with LUTS.   He was last seen in clinic on 01/23/2015. He was noted to have urinary hesitancy and primary right testicular pain.  At the time, he was felt to have possibly a pelvic floor component to both his genital pain as well as urinary symptoms.  He previously has tried Flomax which had no improvement on his symptoms. He was seen by his PCP Dr Einar Pheasant on 08/14/2021. He was noted to have nocturia and discussed behavioral management. He was referred to back urology.   IPSS 15 below. PVR of 25 ml.   He reports today that he has a weak stream. He has discussed with his PCP about his nocturia. He wakes up 2-3x nightly but he intermittently has to void every hour. He intermittently has a split stream.  Although his weak stream in the daytime, he is most bothered by his nighttime urinary symptoms.  He does snore but is never had a sleep study.  He is not interested in pursuing this.  He does drink water throughout the day and into the evening.  He is not interested in cutting back his water before bedtime.  He also mentions today that he has off-and-on genital pain which is chronic.  It is flared back up over the last couple of days.  He is worried about testicular cancer.  He is accompanied by his wife today who also helps contribute to his history.  He denies any dysuria or gross hematuria.  He recently injured his right fifth metatarsal and is nonweightbearing on that side.  This is affected his mood in the setting of inability to exercise.  He is an avid runner.   IPSS     Row Name 11/12/21 1600         International Prostate Symptom Score   How often have  you had the sensation of not emptying your bladder? Not at All     How often have you had to urinate less than every two hours? Not at All     How often have you found you stopped and started again several times when you urinated? Almost always     How often have you found it difficult to postpone urination? About half the time     How often have you had a weak urinary stream? Less than half the time     How often have you had to strain to start urination? Almost always     How many times did you typically get up at night to urinate? None     Total IPSS Score 15       Quality of Life due to urinary symptoms   If you were to spend the rest of your life with your urinary condition just the way it is now how would you feel about that? Mixed              Score:  1-7 Mild 8-19 Moderate 20-35 Severe  PMH: Past Medical History:  Diagnosis Date   Dysplastic nevus 04/09/2015   left post lateral deltoid, mild atypia    Dysplastic nevus 05/17/2014   right prox med thigh, mild atypia  Dysplastic nevus 08/22/2008   right post shoulder, slight atypia    Dysplastic nevus 01/04/2007   R sup post lat thigh, slight atypia    Dysplastic nevus 09/15/2006   left post waistline paraspinal, severe atypia    Dysplastic nevus 07/09/2006   Left lower back, slight atypia    Dysplastic nevus 07/09/2006   right ear, moderate atypia    Melanoma ([place]) 1995   L pectoral     Surgical History: Past Surgical History:  Procedure Laterality Date   BACK SURGERY N/A 2004, 2005   L3, 4 and L4,5   CATARACT EXTRACTION, BILATERAL     08/12/16 and 08/19/16.   COLONOSCOPY  02/07/2016   By dr Carlisle Beers polyps, internal hemorrhoids, waiting path report   HERNIA REPAIR     LAMINECTOMY     x 2   MELANOMA EXCISION     chest area   TONSILLECTOMY      Home Medications:  Allergies as of 11/12/2021   No Known Allergies      Medication List        Accurate as of Nov 12, 2021 11:59 PM. If you have any  questions, ask your nurse or doctor.          STOP taking these medications    gabapentin 300 MG capsule Commonly known as: NEURONTIN Stopped by: Hollice Espy, MD   ibuprofen 800 MG tablet Commonly known as: ADVIL Stopped by: Hollice Espy, MD   ketorolac 10 MG tablet Commonly known as: TORADOL Stopped by: Hollice Espy, MD       TAKE these medications    diazepam 10 MG tablet Commonly known as: Valium Take 1 hr prior to procedure, have a driver Started by: Hollice Espy, MD   ondansetron 4 MG disintegrating tablet Commonly known as: ZOFRAN-ODT Take 1 tablet (4 mg total) by mouth every 8 (eight) hours as needed for nausea or vomiting.        Allergies:  No Known Allergies  Family History: Family History  Problem Relation Age of Onset   Arthritis Mother    Colon cancer Mother    Hypertension Father    Asthma Father    Hypertension Brother    Obesity Brother    Asthma Son    Obesity Son    Kidney disease Neg Hx    Prostate cancer Neg Hx     Social History:  reports that he has never smoked. He has never used smokeless tobacco. He reports that he does not currently use alcohol. He reports that he does not use drugs.   Physical Exam: BP 134/80   Pulse 66   Ht '6\' 4"'$  (1.93 m)   Wt 200 lb (90.7 kg)   BMI 24.34 kg/m   Constitutional:  Alert and oriented, No acute distress. HEENT: [place] AT, moist mucus membranes.  Trachea midline, no masses. Cardiovascular: No clubbing, cyanosis, or edema. Respiratory: Normal respiratory effort, no increased work of breathing. GI: No inguinal hernia  GU: No CVA tenderness.  No bladder fullness or masses.  Patient with circumcised phallus. Foreskin easily retracted  Urethral meatus is patent.  No penile discharge. No penile lesions or rashes. Scrotum without lesions, cysts, rashes and/or edema.  Bilateral hydroceles, testicles are located scrotally bilaterally. No right epididymis, probable 1.5 cm cm right spermatocele  ,no testicular masses.  No inguinal hernias bilaterally. Skin: No rashes, bruises or suspicious lesions. Neurologic: Grossly intact, no focal deficits, moving all 4 extremities. Psychiatric: Normal mood and affect.  Laboratory  Data: Lab Results  Component Value Date   CREATININE 1.03 08/14/2021    Urinalysis Unremarkable   Pertinent Imaging: Results for orders placed or performed in visit on 11/12/21  Microscopic Examination   Urine  Result Value Ref Range   WBC, UA 0-5 0 - 5 /hpf   RBC 0-2 0 - 2 /hpf   Epithelial Cells (non renal) 0-10 0 - 10 /hpf   Bacteria, UA Moderate (A) None seen/Few  Urinalysis, Complete  Result Value Ref Range   Specific Gravity, UA 1.030 1.005 - 1.030   pH, UA 5.5 5.0 - 7.5   Color, UA Yellow Yellow   Appearance Ur Clear Clear   Leukocytes,UA Negative Negative   Protein,UA Negative Negative/Trace   Glucose, UA Negative Negative   Ketones, UA Negative Negative   RBC, UA Negative Negative   Bilirubin, UA Negative Negative   Urobilinogen, Ur 0.2 0.2 - 1.0 mg/dL   Nitrite, UA Negative Negative   Microscopic Examination See below:     Assessment & Plan:    BPH with nocturia and weak stream   - We discussed differential of pelvic floor dysfunction versus untreated  sleep apnea versus BPH.  We discussed that each of these may have a contributing factor to his urinary symptoms and are not mutually exclusive.  He is not interested in pelvic floor therapy or evaluation for possible sleep apnea.  In terms of BPH, we discussed treatment of symptoms with medicine versus outlet procedure versus pelvic floor exercises.  - We discussed outlet procedures in detail and that to see if he is a good candidate for procedure he would need to undergo a cystoscopy/ TRUS to evaluate prostate health and anatomy.  This may also help guide management moving forward.  2. Chronic testicular  - Continues to be symptomatic  - Issues may be exasperated by of pelvic floor  issues as well as anxiety  -Continue supportive care, General exam today was reassuring and unremarkable  Follow-up cystoscopy TRUS  Conley Rolls as a scribe for Hollice Espy, MD.,have documented all relevant documentation on the behalf of Hollice Espy, MD,as directed by  Hollice Espy, MD while in the presence of Hollice Espy, MD.  I have reviewed the above documentation for accuracy and completeness, and I agree with the above.   Hollice Espy, MD  I spent 50 total minutes on the day of the encounter including pre-visit review of the medical record, face-to-face time with the patient, and post visit ordering of labs/imaging/tests.  Conversation with he and his wife today was lengthy.   [place] [address], [place], [place] [address] [phone]

## 2021-11-13 LAB — MICROSCOPIC EXAMINATION

## 2021-11-13 LAB — URINALYSIS, COMPLETE
Bilirubin, UA: NEGATIVE
Glucose, UA: NEGATIVE
Ketones, UA: NEGATIVE
Leukocytes,UA: NEGATIVE
Nitrite, UA: NEGATIVE
Protein,UA: NEGATIVE
RBC, UA: NEGATIVE
Specific Gravity, UA: 1.03 (ref 1.005–1.030)
Urobilinogen, Ur: 0.2 mg/dL (ref 0.2–1.0)
pH, UA: 5.5 (ref 5.0–7.5)

## 2021-11-13 MED ORDER — DIAZEPAM 10 MG PO TABS: ORAL_TABLET | ORAL | 0 refills | Status: DC

## 2021-11-15 DIAGNOSIS — S92354A Nondisplaced fracture of fifth metatarsal bone, right foot, initial encounter for closed fracture: Secondary | ICD-10-CM | POA: Diagnosis not present

## 2021-11-17 ENCOUNTER — Telehealth: Payer: Self-pay | Admitting: Internal Medicine

## 2021-11-17 NOTE — Telephone Encounter (Signed)
Pts wife was in and was following up on the request for her to give B12 injections.  She is a Marine scientist. I am ok with her giving the injections. See last phone message. Please notify pt and ok to call in B12 - to give injections at home.

## 2021-11-18 ENCOUNTER — Other Ambulatory Visit: Payer: Self-pay

## 2021-11-18 DIAGNOSIS — E538 Deficiency of other specified B group vitamins: Secondary | ICD-10-CM

## 2021-11-18 MED ORDER — "BD ECLIPSE SYRINGE 25G X 1"" 3 ML MISC": 0 refills | Status: DC

## 2021-11-18 MED ORDER — CYANOCOBALAMIN 1000 MCG/ML IJ SOLN: 1000.0000 ug | INTRAMUSCULAR | 0 refills | Status: DC

## 2021-11-18 MED ORDER — "BD DISP NEEDLES 25G X 5/8"" MISC": 0 refills | Status: DC

## 2021-11-18 NOTE — Telephone Encounter (Signed)
LM that B-12 was sent to pharmacy. I have sent with script for syringe & needles as well.

## 2021-11-20 ENCOUNTER — Encounter: Payer: Self-pay | Admitting: Podiatry

## 2021-11-20 ENCOUNTER — Ambulatory Visit: Payer: BC Managed Care – PPO | Admitting: Urology

## 2021-11-20 ENCOUNTER — Encounter: Payer: Self-pay | Admitting: Urology

## 2021-11-20 ENCOUNTER — Ambulatory Visit (INDEPENDENT_AMBULATORY_CARE_PROVIDER_SITE_OTHER): Payer: BC Managed Care – PPO

## 2021-11-20 ENCOUNTER — Ambulatory Visit: Payer: BC Managed Care – PPO | Admitting: Podiatry

## 2021-11-20 VITALS — BP 135/76 | HR 80 | Ht 76.0 in | Wt 200.0 lb

## 2021-11-20 DIAGNOSIS — G8929 Other chronic pain: Secondary | ICD-10-CM

## 2021-11-20 DIAGNOSIS — S92354D Nondisplaced fracture of fifth metatarsal bone, right foot, subsequent encounter for fracture with routine healing: Secondary | ICD-10-CM

## 2021-11-20 DIAGNOSIS — R351 Nocturia: Secondary | ICD-10-CM

## 2021-11-20 DIAGNOSIS — N138 Other obstructive and reflux uropathy: Secondary | ICD-10-CM

## 2021-11-20 DIAGNOSIS — R3912 Poor urinary stream: Secondary | ICD-10-CM

## 2021-11-20 DIAGNOSIS — N401 Enlarged prostate with lower urinary tract symptoms: Secondary | ICD-10-CM | POA: Diagnosis not present

## 2021-11-20 DIAGNOSIS — N50819 Testicular pain, unspecified: Secondary | ICD-10-CM

## 2021-11-20 MED ORDER — VITAMIN D (ERGOCALCIFEROL) 1.25 MG (50000 UNIT) PO CAPS: 50000.0000 [IU] | ORAL_CAPSULE | ORAL | 0 refills | Status: DC

## 2021-11-20 NOTE — Progress Notes (Addendum)
   11/20/2021 CC:  Chief Complaint  Patient presents with   Cysto   Trus     HPI: LUCCIANO Pruitt is a 56 y.o. male with a personal history of BPH with nocturia and weak stream and chronic testicular pain.   He previously has tried Flomax which had no improvement on his symptoms.   He is interested in an outlet procedure for his BPH.   Vitals:   11/20/21 1405  BP: 135/76  Pulse: 80  NED. A&Ox3.   No respiratory distress   Abd soft, NT, ND Normal phallus with bilateral descended testicles  Cystoscopy Procedure Note  Patient identification was confirmed, informed consent was obtained, and patient was prepped using Betadine solution.  Lidocaine jelly was administered per urethral meatus.     Pre-Procedure: - Inspection reveals a normal caliber ureteral meatus.  Procedure: The flexible cystoscope was introduced without difficulty - No urethral strictures/lesions are present. - Enlarged prostate trilobar coaptation  - Bilateral ureteral orifices identified - Bladder mucosa  reveals no ulcers, tumors, or lesions - No bladder stones - No trabeculation  Retroflexion shows an intravesical component of prostate but without a decrete circumscribed median lobe  Post-Procedure: - Patient tolerated the procedure well   Prostate transrectal ultrasound sizing   Informed consent was obtained after discussing risks/benefits of the procedure.  A time out was performed to ensure correct patient identity.   Pre-Procedure: -Transrectal probe was placed without difficulty -Transrectal Ultrasound performed revealing a 58.6 gm prostate measuring 3.95 x 5.25 x 5.41 cm (length) -No significant hypoechoic or median lobe noted   Assessment/ Plan:  BPH with obstruction/ LUTS  - We discussed outlet procedures in detail today such a UroLift, TURP, and HoLEP. His is a good candidate for all procedures. Would recommend UroLift.  -We also discussed contributing factors to his urinary  symptoms as previously discussed in the office along with alternatives including optimization of medications. - We discussed the risk of UroLift including pain or burning with urination, blood in the urine, pelvic pain, urgent need to urinate and/or the inability to control the urge.  He will let us know how would like to proceed  I,Kailey Littlejohn,acting as a scribe for Vanna Scotland, MD.,have documented all relevant documentation on the behalf of Vanna Scotland, MD,as directed by  Vanna Scotland, MD while in the presence of Vanna Scotland, MD.  I have reviewed the above documentation for accuracy and completeness, and I agree with the above.   Vanna Scotland, MD

## 2021-11-20 NOTE — Progress Notes (Signed)
He presents today for follow-up of his Jones fracture fifth metatarsal of his right foot.  States that he really has not hurt and have used my boot 80 to 90% of the time otherwise he continues nonweightbearing status with his crutches.  He is in a much more jovial mood than last visit and understands that most likely he is going to have to have his crutches knee scooter and boot with him as he proceeds to port for his cruise.  However the end of July he will be traveling to [facility] and wants to be healed completely before that time.  His wife does go on to say that he has low vitamin D.  Objective: Vital signs are stable alert and oriented x3.  Pulses are palpable there is no erythema minimal edema no cellulitis drainage or odor.  Radiographs taken today demonstrate no change in diastases of the fifth met base fracture/Jones fracture.  Assessment Jones fracture.  Plan: Continue nonweightbearing status with his cam boot for another 6 to 8 weeks but I will follow-up with him in 4 weeks for another set of x-rays.

## 2021-12-12 ENCOUNTER — Ambulatory Visit (INDEPENDENT_AMBULATORY_CARE_PROVIDER_SITE_OTHER): Payer: BC Managed Care – PPO

## 2021-12-12 ENCOUNTER — Ambulatory Visit: Payer: BC Managed Care – PPO | Admitting: Podiatry

## 2021-12-12 DIAGNOSIS — S92354D Nondisplaced fracture of fifth metatarsal bone, right foot, subsequent encounter for fracture with routine healing: Secondary | ICD-10-CM | POA: Diagnosis not present

## 2021-12-15 ENCOUNTER — Other Ambulatory Visit: Payer: Self-pay | Admitting: Internal Medicine

## 2021-12-19 NOTE — Progress Notes (Signed)
He presents today for follow-up of his Jones fracture fifth metatarsal of his right foot.  He states doing a lot better he does not any pain.  He would like to know if he can come out of the boot.  He is known to Dr. Milinda Pointer.  He denies any other acute complaints.  His wife does go on to say that he has low vitamin D.  Objective: Vital signs are stable alert and oriented x3.  Pulses are palpable there is no erythema minimal edema no cellulitis drainage or odor.  Radiographs taken today demonstrate no change in diastases of the fifth met base fracture/Jones fracture.  Assessment Jones fracture.  Plan: Begin transitioning to weightbearing as tolerated in regular shoes.  X-rays were reviewed which shows some consolidation across the fracture site however I discussed with the patient this can take anywhere from 6 months to a year to completely consolidate due to vascular flow in that area

## 2021-12-30 ENCOUNTER — Ambulatory Visit: Payer: BC Managed Care – PPO | Admitting: Podiatry

## 2022-01-09 ENCOUNTER — Ambulatory Visit: Payer: BC Managed Care – PPO | Admitting: Podiatry

## 2022-01-09 DIAGNOSIS — S92354D Nondisplaced fracture of fifth metatarsal bone, right foot, subsequent encounter for fracture with routine healing: Secondary | ICD-10-CM

## 2022-01-09 NOTE — Progress Notes (Signed)
He presents today for follow-up of his Jones fracture fifth metatarsal of his right foot.  He states doing a lot better he does not any pain.  He wants to know if he can return to running/moving.  He denies any other acute complaints  His wife does go on to say that he has low vitamin D.  Objective: Vital signs are stable alert and oriented x3.  Pulses are palpable there is no erythema minimal edema no cellulitis drainage or odor.   Assessment Jones fracture.  Plan: Patient has been able to resume her normal activities without any pain.  At this time patient is officially discharged from my care.  If any foot and ankle issues that arise in the future of asked him to come back and see me.

## 2022-01-27 ENCOUNTER — Telehealth: Payer: Self-pay | Admitting: Internal Medicine

## 2022-01-27 MED ORDER — IBUPROFEN 800 MG PO TABS: 800.0000 mg | ORAL_TABLET | Freq: Two times a day (BID) | ORAL | 0 refills | Status: DC | PRN

## 2022-01-27 NOTE — Telephone Encounter (Signed)
Pt broke his foot, has been seen by podiatrist. Still has some pain, but has been released by podiatry. Also periodically has back pain. Uses ibuprofen for that. Per Sharyn Lull, pt takes very seldom but is very low on medication. They are going on cruise, wants to take with him.

## 2022-01-27 NOTE — Telephone Encounter (Signed)
Rx ok'd for ibuprofen.  Rx sent in to pharmacy.

## 2022-01-27 NOTE — Telephone Encounter (Signed)
Pts wife had reported that he needed refill ibuprofen. Please call him and confirm symptoms and see what is needed.  I do not mind refilling, just need more information.

## 2022-01-27 NOTE — Addendum Note (Signed)
Addended by: Alisa Graff on: 01/27/2022 08:32 PM   Modules accepted: Orders

## 2022-01-28 ENCOUNTER — Other Ambulatory Visit: Payer: Self-pay

## 2022-01-28 DIAGNOSIS — E538 Deficiency of other specified B group vitamins: Secondary | ICD-10-CM

## 2022-01-28 DIAGNOSIS — E559 Vitamin D deficiency, unspecified: Secondary | ICD-10-CM

## 2022-01-28 DIAGNOSIS — Z13220 Encounter for screening for lipoid disorders: Secondary | ICD-10-CM

## 2022-01-28 NOTE — Telephone Encounter (Signed)
Per pt wife - ok to hold psa for now. They are going on a cruise, and then pt is going on business trip - isnt sure when he is returning. Does not want to hold on all labs till he returns. Lab appt kept as is. Tentatively booked psa for mid September, but pt wife will call if that is not going to work.

## 2022-01-28 NOTE — Telephone Encounter (Signed)
Pt wife asking for pt's B12 and D to be checked. Pt recieves b12 shots a home.  Ok to order/sched ?

## 2022-01-28 NOTE — Telephone Encounter (Signed)
Ok to order lipid panel, met b, vitamin D (dx vit d def) and B12 (B12 def.).  unable to do psa until after 02/11/22 - if wants to change lab appt to after 02/11/22 - would be able to do psa then.

## 2022-02-06 ENCOUNTER — Other Ambulatory Visit (INDEPENDENT_AMBULATORY_CARE_PROVIDER_SITE_OTHER): Payer: BC Managed Care – PPO

## 2022-02-06 DIAGNOSIS — E538 Deficiency of other specified B group vitamins: Secondary | ICD-10-CM

## 2022-02-06 DIAGNOSIS — E559 Vitamin D deficiency, unspecified: Secondary | ICD-10-CM | POA: Diagnosis not present

## 2022-02-06 LAB — VITAMIN B12: Vitamin B-12: 433 pg/mL (ref 211–911)

## 2022-02-06 LAB — VITAMIN D 25 HYDROXY (VIT D DEFICIENCY, FRACTURES): VITD: 38.28 ng/mL (ref 30.00–100.00)

## 2022-02-07 ENCOUNTER — Telehealth: Payer: Self-pay

## 2022-02-07 NOTE — Telephone Encounter (Signed)
-----   Message from Einar Pheasant, MD sent at 02/07/2022  5:18 AM EDT ----- Notify - vitamin D level is wnl.  B12 level has improved.  Continue as he is doing.

## 2022-02-07 NOTE — Telephone Encounter (Signed)
LMTCB for lab results.  

## 2022-02-10 ENCOUNTER — Encounter: Payer: Self-pay | Admitting: *Deleted

## 2022-02-14 ENCOUNTER — Encounter: Payer: BC Managed Care – PPO | Admitting: Internal Medicine

## 2022-02-28 ENCOUNTER — Other Ambulatory Visit (INDEPENDENT_AMBULATORY_CARE_PROVIDER_SITE_OTHER): Payer: BC Managed Care – PPO

## 2022-02-28 DIAGNOSIS — Z125 Encounter for screening for malignant neoplasm of prostate: Secondary | ICD-10-CM | POA: Diagnosis not present

## 2022-02-28 LAB — PSA: PSA: 0.64 ng/mL (ref 0.10–4.00)

## 2022-03-10 ENCOUNTER — Telehealth: Payer: Self-pay

## 2022-03-10 NOTE — Telephone Encounter (Signed)
Lvm for pt to return call in regards to PSA results.  Per Dr.Scott: I reviewed your recent lab result and your psa is ok.  Let us know if you need anything.     Hope you are doing well.  Dr Nicki Reaper

## 2022-03-11 NOTE — Telephone Encounter (Signed)
Called pt x2 lvm x2 for pt to return call in regards to PSA results.  See note.

## 2022-07-30 ENCOUNTER — Telehealth: Payer: Self-pay

## 2022-07-30 ENCOUNTER — Encounter: Payer: Self-pay | Admitting: Internal Medicine

## 2022-07-30 ENCOUNTER — Encounter: Payer: Self-pay | Admitting: Dermatology

## 2022-07-30 ENCOUNTER — Ambulatory Visit: Payer: BC Managed Care – PPO | Admitting: Dermatology

## 2022-07-30 VITALS — BP 124/71 | HR 133

## 2022-07-30 DIAGNOSIS — L821 Other seborrheic keratosis: Secondary | ICD-10-CM

## 2022-07-30 DIAGNOSIS — Z1283 Encounter for screening for malignant neoplasm of skin: Secondary | ICD-10-CM | POA: Diagnosis not present

## 2022-07-30 DIAGNOSIS — L570 Actinic keratosis: Secondary | ICD-10-CM | POA: Diagnosis not present

## 2022-07-30 DIAGNOSIS — R591 Generalized enlarged lymph nodes: Secondary | ICD-10-CM

## 2022-07-30 DIAGNOSIS — L814 Other melanin hyperpigmentation: Secondary | ICD-10-CM

## 2022-07-30 DIAGNOSIS — Z85820 Personal history of malignant melanoma of skin: Secondary | ICD-10-CM

## 2022-07-30 DIAGNOSIS — L089 Local infection of the skin and subcutaneous tissue, unspecified: Secondary | ICD-10-CM | POA: Diagnosis not present

## 2022-07-30 DIAGNOSIS — D229 Melanocytic nevi, unspecified: Secondary | ICD-10-CM

## 2022-07-30 NOTE — Telephone Encounter (Signed)
Called imaging. Patient has been scheduled for Friday 08/08/2022 at 10:15 am. Called patient, LMOVM with appt. information.

## 2022-07-30 NOTE — Progress Notes (Unsigned)
Follow-Up Visit   Subjective  Paul Pruitt is a 57 y.o. male who presents for the following: Annual Exam (Hx of multiple dysplastic nevi. Hx MM).  The patient presents for Total-Body Skin Exam (TBSE) for skin cancer screening and mole check.  The patient has spots, moles and lesions to be evaluated, some may be new or changing and the patient has concerns that these could be cancer.   The following portions of the chart were reviewed this encounter and updated as appropriate:  Tobacco  Allergies  Meds  Problems  Med Hx  Surg Hx  Fam Hx      Review of Systems: No other skin or systemic complaints except as noted in HPI or Assessment and Plan.   Objective  Well appearing patient in no apparent distress; mood and affect are within normal limits.  A full examination was performed including scalp, head, eyes, ears, nose, lips, neck, chest, axillae, abdomen, back, buttocks, bilateral upper extremities, bilateral lower extremities, hands, feet, fingers, toes, fingernails, and toenails. All findings within normal limits unless otherwise noted below.  Left Inguinal fold Firm subcutaneous nodules  right temple Erythematous keratotic papule  left vertex scalp, left forehead (2) Erythematous thin papules/macules with gritty scale.   Left Foot - Anterior Scaling and maceration at toe web spaces    Assessment & Plan   History of Melanoma. Left pectoral. 1995 - No evidence of recurrence today - No lymphadenopathy - Recommend regular full body skin exams - Recommend daily broad spectrum sunscreen SPF 30+ to sun-exposed areas, reapply every 2 hours as needed.  - Call if any new or changing lesions are noted between office visits   Lentigines - Scattered tan macules - Due to sun exposure - Benign-appearing, observe - Recommend daily broad spectrum sunscreen SPF 30+ to sun-exposed areas, reapply every 2 hours as needed. - Call for any changes  Seborrheic Keratoses -  Stuck-on, waxy, tan-brown papules and/or plaques  - Benign-appearing - Discussed benign etiology and prognosis. - Observe - Call for any changes  Melanocytic Nevi - Tan-brown and/or pink-flesh-colored symmetric macules and papules - Benign appearing on exam today - Observation - Call clinic for new or changing moles - Recommend daily use of broad spectrum spf 30+ sunscreen to sun-exposed areas.   Hemangiomas - Red papules - Discussed benign nature - Observe - Call for any changes  Actinic Damage - Chronic condition, secondary to cumulative UV/sun exposure - diffuse scaly erythematous macules with underlying dyspigmentation - Recommend daily broad spectrum sunscreen SPF 30+ to sun-exposed areas, reapply every 2 hours as needed.  - Staying in the shade or wearing long sleeves, sun glasses (UVA+UVB protection) and wide brim hats (4-inch brim around the entire circumference of the hat) are also recommended for sun protection.  - Call for new or changing lesions.  Skin cancer screening performed today.  Lymphadenopathy Left Inguinal fold  Will order ultrasound.  Related Procedures [place] LT LOWER EXTREM LTD SOFT TISSUE NON VASCULAR  Hypertrophic actinic keratosis right temple  Actinic keratoses are precancerous spots that appear secondary to cumulative UV radiation exposure/sun exposure over time. They are chronic with expected duration over 1 year. A portion of actinic keratoses will progress to squamous cell carcinoma of the skin. It is not possible to reliably predict which spots will progress to skin cancer and so treatment is recommended to prevent development of skin cancer.  Recommend daily broad spectrum sunscreen SPF 30+ to sun-exposed areas, reapply every 2 hours as needed.  Recommend staying in the shade or wearing long sleeves, sun glasses (UVA+UVB protection) and wide brim hats (4-inch brim around the entire circumference of the hat). Call for new or changing  lesions.  RTC after speaking engagement for LN2 treatment.   AK (actinic keratosis) (2) left vertex scalp, left forehead  Actinic keratoses are precancerous spots that appear secondary to cumulative UV radiation exposure/sun exposure over time. They are chronic with expected duration over 1 year. A portion of actinic keratoses will progress to squamous cell carcinoma of the skin. It is not possible to reliably predict which spots will progress to skin cancer and so treatment is recommended to prevent development of skin cancer.  Recommend daily broad spectrum sunscreen SPF 30+ to sun-exposed areas, reapply every 2 hours as needed.  Recommend staying in the shade or wearing long sleeves, sun glasses (UVA+UVB protection) and wide brim hats (4-inch brim around the entire circumference of the hat). Call for new or changing lesions.  RTC after speaking engagement for LN2 treatment.   Infection of toe web Left Foot - Anterior  Tinea pedis vs erythrasma or mix of both   Start OTC Clotrimazole cream twice daily to feet and between toes until clear then once a week for maintenance.    Return in about 1 year (around 07/31/2023) for TBSE, HxMM, HxDN, AK treatment in next month.  I, Emelia Salisbury, CMA, am acting as scribe for Forest Gleason, MD.  Documentation: I have reviewed the above documentation for accuracy and completeness, and I agree with the above.  Forest Gleason, MD

## 2022-07-30 NOTE — Patient Instructions (Addendum)
Feet: Start OTC Clotrimazole cream twice daily to feet and between toes until clear then once a week for maintenance.    Recommend taking Heliocare sun protection supplement daily in sunny weather for additional sun protection. For maximum protection on the sunniest days, you can take up to 2 capsules of regular Heliocare OR take 1 capsule of Heliocare Ultra. For prolonged exposure (such as a full day in the sun), you can repeat your dose of the supplement 4 hours after your first dose. Heliocare can be purchased at [employer], at some [facility] or at [email or web address].    Recommend daily broad spectrum sunscreen SPF 30+ to sun-exposed areas, reapply every 2 hours as needed. Call for new or changing lesions.  Staying in the shade or wearing long sleeves, sun glasses (UVA+UVB protection) and wide brim hats (4-inch brim around the entire circumference of the hat) are also recommended for sun protection.    Melanoma ABCDEs  Melanoma is the most dangerous type of skin cancer, and is the leading cause of death from skin disease.  You are more likely to develop melanoma if you: Have light-colored skin, light-colored eyes, or red or blond hair Spend a lot of time in the sun Tan regularly, either outdoors or in a tanning bed Have had blistering sunburns, especially during childhood Have a close family member who has had a melanoma Have atypical moles or large birthmarks  Early detection of melanoma is key since treatment is typically straightforward and cure rates are extremely high if we catch it early.   The first sign of melanoma is often a change in a mole or a new dark spot.  The ABCDE system is a way of remembering the signs of melanoma.  A for asymmetry:  The two halves do not match. B for border:  The edges of the growth are irregular. C for color:  A mixture of colors are present instead of an even brown color. D for diameter:  Melanomas are usually (but not always) greater than  2m - the size of a pencil eraser. E for evolution:  The spot keeps changing in size, shape, and color.  Please check your skin once per month between visits. You can use a small mirror in front and a large mirror behind you to keep an eye on the back side or your body.   If you see any new or changing lesions before your next follow-up, please call to schedule a visit.  Please continue daily skin protection including broad spectrum sunscreen SPF 30+ to sun-exposed areas, reapplying every 2 hours as needed when you're outdoors.   Staying in the shade or wearing long sleeves, sun glasses (UVA+UVB protection) and wide brim hats (4-inch brim around the entire circumference of the hat) are also recommended for sun protection.    Due to recent changes in healthcare laws, you may see results of your pathology and/or laboratory studies on MyChart before the doctors have had a chance to review them. We understand that in some cases there may be results that are confusing or concerning to you. Please understand that not all results are received at the same time and often the doctors may need to interpret multiple results in order to provide you with the best plan of care or course of treatment. Therefore, we ask that you please give u[place]2 business days to thoroughly review all your results before contacting the office for clarification. Should we see a critical lab result, you will be  contacted sooner.   If You Need Anything After Your Visit  If you have any questions or concerns for your doctor, please call our main line at [phone] and press option 4 to reach your doctor's medical assistant. If no one answers, please leave a voicemail as directed and we will return your call as soon as possible. Messages left after 4 pm will be answered the following business day.   You may also send [place] a message via [place]. We typically respond to MyChart messages within 1-2 business days.  For prescription  refills, please ask your pharmacy to contact our office. Our fax number is [phone].  If you have an urgent issue when the clinic is closed that cannot wait until the next business day, you can page your doctor at the number below.    Please note that while we do our best to be available for urgent issues outside of office hours, we are not available 24/7.   If you have an urgent issue and are unable to reach [place], you may choose to seek medical care at your doctor's office, retail clinic, urgent care center, or emergency room.  If you have a medical emergency, please immediately call 911 or go to the emergency department.  Pager Numbers  - Dr. Nehemiah Massed: [phone]  - Dr. Laurence Ferrari: [phone]  - Dr. Nicole Kindred: [phone]  In the event of inclement weather, please call our main line at [phone] for an update on the status of any delays or closures.  Dermatology Medication Tips: Please keep the boxes that topical medications come in in order to help keep track of the instructions about where and how to use these. Pharmacies typically print the medication instructions only on the boxes and not directly on the medication tubes.   If your medication is too expensive, please contact our office at [phone] option 4 or send [place] a message through [place].   We are unable to tell what your co-pay for medications will be in advance as this is different depending on your insurance coverage. However, we may be able to find a substitute medication at lower cost or fill out paperwork to get insurance to cover a needed medication.   If a prior authorization is required to get your medication covered by your insurance company, please allow [place] 1-2 business days to complete this process.  Drug prices often vary depending on where the prescription is filled and some pharmacies may offer cheaper prices.  The website www.goodrx.com contains coupons for medications through different pharmacies. The  prices here do not account for what the cost may be with help from insurance (it may be cheaper with your insurance), but the website can give you the price if you did not use any insurance.  - You can print the associated coupon and take it with your prescription to the pharmacy.  - You may also stop by our office during regular business hours and pick up a GoodRx coupon card.  - If you need your prescription sent electronically to a different pharmacy, notify our office through [facility] or by phone at [phone] option 4.     Si Usted Necesita Algo Despus de Su Visita  Tambin puede enviarnos un mensaje a travs de Pharmacist, community. Por lo general respondemos a los mensajes de MyChart en el transcurso de 1 a 2 das hbiles.  Para renovar recetas, por favor pida a su farmacia que se ponga en contacto con nuestra oficina. Harland Dingwall de fax es  el [phone].  Si tiene un asunto urgente cuando la clnica est cerrada y que no puede esperar hasta el siguiente da hbil, puede llamar/localizar a su doctor(a) al nmero que aparece a continuacin.   Por favor, tenga en cuenta que aunque hacemos todo lo posible para estar disponibles para asuntos urgentes fuera del horario de [facility], no estamos disponibles las 24 horas del da, los 7 das de la [place].   Si tiene un problema urgente y no puede comunicarse con nosotros, puede optar por buscar atencin mdica  en el consultorio de su doctor(a), en una clnica privada, en un centro de atencin urgente o en una sala de emergencias.  Si tiene Engineering geologist, por favor llame inmediatamente al 911 o vaya a la sala de emergencias.  Nmeros de bper  - Dr. Nehemiah Massed: [phone]  - Dra. Moye: [phone]  - Dra. Nicole Kindred: [phone]  En caso de inclemencias del [place], por favor llame a Johnsie Kindred principal al [phone] para una actualizacin sobre el [place] de cualquier retraso o cierre.  Consejos para la medicacin en  dermatologa: Por favor, guarde las cajas en las que vienen los medicamentos de uso tpico para ayudarle a seguir las instrucciones sobre dnde y cmo usarlos. Las farmacias generalmente imprimen las instrucciones del medicamento slo en las cajas y no directamente en los tubos del [place].   Si su medicamento es muy caro, por favor, pngase en contacto con Zigmund Daniel llamando al [phone] y presione la opcin 4 o envenos un mensaje a travs de Pharmacist, community.   No podemos decirle cul ser su copago por los medicamentos por adelantado ya que esto es diferente dependiendo de la cobertura de su seguro. Sin embargo, es posible que podamos encontrar un medicamento sustituto a Electrical engineer un formulario para que el seguro cubra el medicamento que se considera necesario.   Si se requiere una autorizacin previa para que su compaa de seguros [place] su medicamento, por favor permtanos de 1 a 2 das hbiles para completar este proceso.  Los precios de los medicamentos varan con frecuencia dependiendo del Environmental consultant de dnde se surte la receta y alguna farmacias pueden ofrecer precios ms baratos.  El sitio web www.goodrx.com tiene cupones para medicamentos de Airline pilot. Los precios aqu no tienen en cuenta lo que podra costar con la ayuda del seguro (puede ser ms barato con su seguro), pero el sitio web puede darle el precio si no utiliz Research scientist (physical sciences).  - Puede imprimir el cupn correspondiente y llevarlo con su receta a la farmacia.  - Tambin puede pasar por nuestra oficina durante el horario de atencin regular y Charity fundraiser una tarjeta de cupones de GoodRx.  - Si necesita que su receta se enve electrnicamente a una farmacia diferente, informe a nuestra oficina a travs de MyChart de [place] o por telfono llamando al [phone] y presione la opcin 4.

## 2022-07-31 NOTE — Telephone Encounter (Signed)
I would like to see him.  See if he can come in on Tuesday 8:00 am

## 2022-07-31 NOTE — Telephone Encounter (Signed)
LMTCB. Just needs appt scheduled.

## 2022-08-06 NOTE — Telephone Encounter (Signed)
Patient rescheduled for march 7

## 2022-08-06 NOTE — Telephone Encounter (Signed)
Pt returned [place] call. Message below was read to him. I scheduled him for 4/1. I didn't see any openings before that, only virtual. Anything sooner, he will take.

## 2022-08-08 ENCOUNTER — Ambulatory Visit
Admission: RE | Admit: 2022-08-08 | Discharge: 2022-08-08 | Disposition: A | Discharge status: Home / Self Care | Payer: BC Managed Care – PPO | Source: Ambulatory Visit | Attending: Dermatology | Admitting: Dermatology

## 2022-08-08 DIAGNOSIS — R591 Generalized enlarged lymph nodes: Secondary | ICD-10-CM | POA: Insufficient documentation

## 2022-08-08 DIAGNOSIS — R590 Localized enlarged lymph nodes: Secondary | ICD-10-CM | POA: Diagnosis not present

## 2022-08-12 ENCOUNTER — Telehealth: Payer: Self-pay

## 2022-08-12 NOTE — Telephone Encounter (Signed)
LMOVM benign lymph node. Will recheck at follow up. C/B if any questions.

## 2022-08-12 NOTE — Telephone Encounter (Signed)
-----   Message from Alfonso Patten, MD sent at 08/12/2022  3:08 PM EST ----- Ultrasound suggested benign/reactive lymph node. Will recheck at his follow-up.   MAs please call. Thank you!

## 2022-08-21 ENCOUNTER — Encounter: Payer: Self-pay | Admitting: Internal Medicine

## 2022-08-21 ENCOUNTER — Ambulatory Visit: Payer: BC Managed Care – PPO | Admitting: Internal Medicine

## 2022-08-21 VITALS — BP 116/70 | HR 80 | Temp 98.2°F | Resp 16 | Ht 76.0 in | Wt 202.0 lb

## 2022-08-21 DIAGNOSIS — Z13220 Encounter for screening for lipoid disorders: Secondary | ICD-10-CM | POA: Diagnosis not present

## 2022-08-21 DIAGNOSIS — E559 Vitamin D deficiency, unspecified: Secondary | ICD-10-CM

## 2022-08-21 DIAGNOSIS — Z86010 Personal history of colonic polyps: Secondary | ICD-10-CM

## 2022-08-21 DIAGNOSIS — R591 Generalized enlarged lymph nodes: Secondary | ICD-10-CM

## 2022-08-21 DIAGNOSIS — N401 Enlarged prostate with lower urinary tract symptoms: Secondary | ICD-10-CM

## 2022-08-21 DIAGNOSIS — D696 Thrombocytopenia, unspecified: Secondary | ICD-10-CM

## 2022-08-21 DIAGNOSIS — E538 Deficiency of other specified B group vitamins: Secondary | ICD-10-CM

## 2022-08-21 DIAGNOSIS — I77810 Thoracic aortic ectasia: Secondary | ICD-10-CM

## 2022-08-21 DIAGNOSIS — N138 Other obstructive and reflux uropathy: Secondary | ICD-10-CM

## 2022-08-21 LAB — TSH: TSH: 2.69 u[IU]/mL (ref 0.35–5.50)

## 2022-08-21 LAB — CBC WITH DIFFERENTIAL/PLATELET
Basophils Absolute: 0 10*3/uL (ref 0.0–0.1)
Basophils Relative: 0.9 % (ref 0.0–3.0)
Eosinophils Absolute: 0.1 10*3/uL (ref 0.0–0.7)
Eosinophils Relative: 2 % (ref 0.0–5.0)
HCT: 41.9 % (ref 39.0–52.0)
Hemoglobin: 14.6 g/dL (ref 13.0–17.0)
Lymphocytes Relative: 25.1 % (ref 12.0–46.0)
Lymphs Abs: 1.2 10*3/uL (ref 0.7–4.0)
MCHC: 34.9 g/dL (ref 30.0–36.0)
MCV: 88.5 fl (ref 78.0–100.0)
Monocytes Absolute: 0.3 10*3/uL (ref 0.1–1.0)
Monocytes Relative: 6.9 % (ref 3.0–12.0)
Neutro Abs: 3.2 10*3/uL (ref 1.4–7.7)
Neutrophils Relative %: 65.1 % (ref 43.0–77.0)
Platelets: 190 10*3/uL (ref 150.0–400.0)
RBC: 4.74 Mil/uL (ref 4.22–5.81)
RDW: 12 % (ref 11.5–15.5)
WBC: 5 10*3/uL (ref 4.0–10.5)

## 2022-08-21 LAB — LIPID PANEL
Cholesterol: 149 mg/dL (ref 0–200)
HDL: 60.9 mg/dL (ref >39.00)
LDL Cholesterol: 81 mg/dL (ref 0–99)
NonHDL: 88.4
Total CHOL/HDL Ratio: 2
Triglycerides: 38 mg/dL (ref 0.0–149.0)
VLDL: 7.6 mg/dL (ref 0.0–40.0)

## 2022-08-21 LAB — BASIC METABOLIC PANEL
BUN: 26 mg/dL — ABNORMAL HIGH (ref 6–23)
CO2: 29 mEq/L (ref 19–32)
Calcium: 9.4 mg/dL (ref 8.4–10.5)
Chloride: 108 mEq/L (ref 96–112)
Creatinine, Ser: 1.07 mg/dL (ref 0.40–1.50)
GFR: 78.14 mL/min (ref >60.00)
Glucose, Bld: 91 mg/dL (ref 70–99)
Potassium: 4.1 mEq/L (ref 3.5–5.1)
Sodium: 142 mEq/L (ref 135–145)

## 2022-08-21 LAB — HEPATIC FUNCTION PANEL
ALT: 18 U/L (ref 0–53)
AST: 21 U/L (ref 0–37)
Albumin: 4 g/dL (ref 3.5–5.2)
Alkaline Phosphatase: 62 U/L (ref 39–117)
Bilirubin, Direct: 0.1 mg/dL (ref 0.0–0.3)
Total Bilirubin: 0.7 mg/dL (ref 0.2–1.2)
Total Protein: 6.2 g/dL (ref 6.0–8.3)

## 2022-08-21 LAB — VITAMIN B12: Vitamin B-12: 332 pg/mL (ref 211–911)

## 2022-08-21 LAB — VITAMIN D 25 HYDROXY (VIT D DEFICIENCY, FRACTURES): VITD: 18.83 ng/mL — ABNORMAL LOW (ref 30.00–100.00)

## 2022-08-21 NOTE — Progress Notes (Signed)
Subjective:    Patient ID: Paul Pruitt, male    DOB: 01/17/1966, 57 y.o.   MRN: [id number]  Patient here for  Chief Complaint  Patient presents with   Medical Management of Chronic Issues    HPI Work in appt - recently had f/u with dermatology 07/30/22 - f/u melanoma.  Found to have left inguinal lymphadenopathy on exam.  Ultrasound - revealed 66mm left inguinal node - felt to be likely reactive/benign.  Discussed further evaluation given history of melanoma - including referral to surgery.  Saw cardiology previously.  ECHO revealed - mild aortic root dilatation of 4.3 cm.  Has not been seen since 12/2020.  Per note, was to f/u 12/2021.  Discussed.  Agreeable for f/u.  No chest pain or sob reported.  No cough or congestion.  Did report bilateral elbow soreness - to forearm.  Has been lifting weights.  Has stopped temporarily - is some better. Also stays active - running and aerobic activity.     Past Medical History:  Diagnosis Date   Dysplastic nevus 04/09/2015   left post lateral deltoid, mild atypia    Dysplastic nevus 05/17/2014   right prox med thigh, mild atypia    Dysplastic nevus 08/22/2008   right post shoulder, slight atypia    Dysplastic nevus 01/04/2007   R sup post lat thigh, slight atypia    Dysplastic nevus 09/15/2006   left post waistline paraspinal, severe atypia    Dysplastic nevus 07/09/2006   Left lower back, slight atypia    Dysplastic nevus 07/09/2006   right ear, moderate atypia    Melanoma ([place]) 1995   L pectoral    Past Surgical History:  Procedure Laterality Date   BACK SURGERY N/A 2004, 2005   L3, 4 and L4,5   CATARACT EXTRACTION, BILATERAL     08/12/16 and 08/19/16.   COLONOSCOPY  02/07/2016   By dr Carlisle Beers polyps, internal hemorrhoids, waiting path report   HERNIA REPAIR     LAMINECTOMY     x 2   MELANOMA EXCISION     chest area   TONSILLECTOMY     Family History  Problem Relation Age of Onset   Arthritis Mother    Colon cancer Mother     Hypertension Father    Asthma Father    Hypertension Brother    Obesity Brother    Asthma Son    Obesity Son    Kidney disease Neg Hx    Prostate cancer Neg Hx    Social History   Socioeconomic History   Marital status: Married    Spouse name: Not on file   Number of children: Not on file   Years of education: Not on file   Highest education level: Not on file  Occupational History   Not on file  Tobacco Use   Smoking status: Never   Smokeless tobacco: Never  Vaping Use   Vaping Use: Never used  Substance and Sexual Activity   Alcohol use: Not Currently    Alcohol/week: 0.0 standard drinks of alcohol    Comment: seldomly-3 drinks a year maybe   Drug use: No   Sexual activity: Yes    Birth control/protection: None  Other Topics Concern   Not on file  Social History Narrative   Patient states heavy exercise. 7 days per week x 30-90 minutes.   Social Determinants of Health   Financial Resource Strain: Not on file  Food Insecurity: Not on file  Transportation Needs:  Not on file  Physical Activity: Not on file  Stress: Not on file  Social Connections: Not on file     Review of Systems  Constitutional:  Negative for appetite change and unexpected weight change.  HENT:  Negative for congestion and sinus pressure.   Respiratory:  Negative for cough, chest tightness and shortness of breath.   Cardiovascular:  Negative for chest pain and palpitations.  Gastrointestinal:  Negative for abdominal pain, diarrhea, nausea and vomiting.  Genitourinary:  Negative for difficulty urinating and dysuria.  Musculoskeletal:  Negative for joint swelling and myalgias.  Skin:  Negative for color change and rash.  Neurological:  Negative for dizziness and headaches.  Psychiatric/Behavioral:  Negative for agitation and dysphoric mood.        Objective:     BP 116/70   Pulse 80   Temp 98.2 F (36.8 C)   Resp 16   Ht 6\' 4"  (1.93 m)   Wt 202 lb (91.6 kg)   SpO2 98%   BMI  24.59 kg/m  Wt Readings from Last 3 Encounters:  08/21/22 202 lb (91.6 kg)  11/20/21 200 lb (90.7 kg)  11/12/21 200 lb (90.7 kg)    Physical Exam Constitutional:      General: He is not in acute distress.    Appearance: Normal appearance. He is well-developed.  HENT:     Head: Normocephalic and atraumatic.     Right Ear: External ear normal.     Left Ear: External ear normal.  Eyes:     General: No scleral icterus.       Right eye: No discharge.        Left eye: No discharge.  Cardiovascular:     Rate and Rhythm: Normal rate and regular rhythm.  Pulmonary:     Effort: Pulmonary effort is normal. No respiratory distress.     Breath sounds: Normal breath sounds.  Abdominal:     General: Bowel sounds are normal.     Palpations: Abdomen is soft.     Tenderness: There is no abdominal tenderness.  Musculoskeletal:        General: No swelling or tenderness.     Cervical back: Neck supple. No tenderness.     Comments: Increased tenderness - left - elbow.    Lymphadenopathy:     Cervical: No cervical adenopathy.  Skin:    Findings: No erythema or rash.     Comments: Could not appreciate - increased lymphadenopathy.   Neurological:     Mental Status: He is alert.  Psychiatric:        Mood and Affect: Mood normal.        Behavior: Behavior normal.      Outpatient Encounter Medications as of 08/21/2022  Medication Sig   ibuprofen (ADVIL) 800 MG tablet Take 1 tablet (800 mg total) by mouth 2 (two) times daily as needed.   [DISCONTINUED] cyanocobalamin (,VITAMIN B-12,) 1000 MCG/ML injection Inject 1 mL (1,000 mcg total) into the muscle every 30 (thirty) days.   [DISCONTINUED] NEEDLE, DISP, 25 G (BD DISP NEEDLES) 25G X 5/8" MISC Used to give monthly B-12 injections.   [DISCONTINUED] ondansetron (ZOFRAN-ODT) 4 MG disintegrating tablet Take 1 tablet (4 mg total) by mouth every 8 (eight) hours as needed for nausea or vomiting.   [DISCONTINUED] SYRINGE-NEEDLE, DISP, 3 ML (BD ECLIPSE  SYRINGE) 25G X 1" 3 ML MISC Used to give monthly B-12 injections.   [DISCONTINUED] Vitamin D, Ergocalciferol, (DRISDOL) 1.25 MG (50000 UNIT) CAPS capsule Take  1 capsule (50,000 Units total) by mouth every 7 (seven) days.   No facility-administered encounter medications on file as of 08/21/2022.     Lab Results  Component Value Date   WBC 5.0 08/21/2022   HGB 14.6 08/21/2022   HCT 41.9 08/21/2022   PLT 190.0 08/21/2022   GLUCOSE 91 08/21/2022   CHOL 149 08/21/2022   TRIG 38.0 08/21/2022   HDL 60.90 08/21/2022   LDLCALC 81 08/21/2022   ALT 18 08/21/2022   AST 21 08/21/2022   NA 142 08/21/2022   K 4.1 08/21/2022   CL 108 08/21/2022   CREATININE 1.07 08/21/2022   BUN 26 (H) 08/21/2022   CO2 29 08/21/2022   TSH 2.69 08/21/2022   PSA 0.64 02/28/2022    [place] LT LOWER EXTREM LTD SOFT TISSUE NON VASCULAR  Result Date: 08/11/2022 CLINICAL DATA:  Left inguinal lymphadenopathy EXAM: ULTRASOUND LEFT LOWER EXTREMITY LIMITED TECHNIQUE: Ultrasound examination of the lower extremity soft tissues was performed in the area of clinical concern. COMPARISON:  None Available. FINDINGS: Targeted ultrasound was performed in the area of clinical concern (left groin) with contralateral images for comparison. With Valsalva, there is mild bulging of a knuckle of bowel along the left inguinal ring (ultrasound series 2, image 147) without frank herniation. Prominent 6 mm short axis left inguinal node, possibly accounting for the clinical abnormality. 4 mm short axis contralateral node in the right inguinal canal. IMPRESSION: Prominent 6 mm short axis left inguinal node, possibly accounting for the clinical abnormality, likely reactive/benign. No frank inguinal hernia. Electronically Signed   By: Julian Hy M.D.   On: 08/11/2022 02:17       Assessment & Plan:  Lymphadenopathy Assessment & Plan: Sees dermatology for f/u history of melanoma.  Found to have left inguinal lymphadenopathy on exam.  Ultrasound -  revealed 73mm left inguinal node - felt to be likely reactive/benign.  Discussed further evaluation given history of melanoma - including referral to surgery.  Agreeable to referral.   Orders: -     CBC with Differential/Platelet -     Basic metabolic panel -     Hepatic function panel -     TSH -     Ambulatory referral to General Surgery  Screening cholesterol level -     Lipid panel  B12 deficiency Assessment & Plan: Recheck B12 level.    Orders: -     Vitamin B12  Vitamin D deficiency Assessment & Plan: Follow vitamin D level.    Orders: -     VITAMIN D 25 Hydroxy (Vit-D Deficiency, Fractures)  Aortic root dilatation [facility]) Assessment & Plan: Has seen Dr Saralyn Pilar.  2D echocardiogram revealed normal left ventricular function, trivial valvular insufficiencies, with mild aortic root dilatation of 4.3 cm. Recommended f/u in one year. Discussed - is overdue.  F/u with cardiology as outlined.     Orders: -     Ambulatory referral to Cardiology  BPH with obstruction/lower urinary tract symptoms Assessment & Plan: Previously had tried flomax.  Did not feel made a difference.  Previous nocturia.  Discussed urology referral.   Orders: -     Ambulatory referral to Urology  Hx of colonic polyp Assessment & Plan: Colonoscopy 07/12/21 - tubular adenoma.  Recommended f/u colonoscopy in 5 years.     Thrombocytopenia ([place]) Assessment & Plan: Follow cbc.       Einar Pheasant, MD

## 2022-08-22 ENCOUNTER — Telehealth: Payer: Self-pay

## 2022-08-22 NOTE — Telephone Encounter (Signed)
-----   Message from Einar Pheasant, MD sent at 08/22/2022  5:34 AM EST ----- Please call and notify - cholesterol levels are ok.  Vitamin D level is low.  Need to confirm if taking any vitamin D.  If no, then start vitamin D3 1000 units per day.  Kidney function tests, hgb, thyroid test and liver function tests are wnl.  B12 level wnl, but at lower end.  If not taking any B12, then start oral B12 1063mcg q day.

## 2022-08-22 NOTE — Telephone Encounter (Signed)
LMTCB

## 2022-08-30 ENCOUNTER — Encounter: Payer: Self-pay | Admitting: Internal Medicine

## 2022-08-30 NOTE — Assessment & Plan Note (Signed)
Follow cbc.  

## 2022-08-30 NOTE — Assessment & Plan Note (Signed)
Sees dermatology for f/u history of melanoma.  Found to have left inguinal lymphadenopathy on exam.  Ultrasound - revealed 41mm left inguinal node - felt to be likely reactive/benign.  Discussed further evaluation given history of melanoma - including referral to surgery.  Agreeable to referral.

## 2022-08-30 NOTE — Assessment & Plan Note (Signed)
Colonoscopy 07/12/21 - tubular adenoma.  Recommended f/u colonoscopy in 5 years.

## 2022-08-30 NOTE — Assessment & Plan Note (Signed)
Has seen Dr Saralyn Pilar.  2D echocardiogram revealed normal left ventricular function, trivial valvular insufficiencies, with mild aortic root dilatation of 4.3 cm. Recommended f/u in one year. Discussed - is overdue.  F/u with cardiology as outlined.

## 2022-08-30 NOTE — Assessment & Plan Note (Signed)
Follow vitamin D level.  

## 2022-08-30 NOTE — Assessment & Plan Note (Signed)
Recheck B12 level 

## 2022-08-30 NOTE — Assessment & Plan Note (Signed)
Previously had tried flomax.  Did not feel made a difference.  Previous nocturia.  Discussed urology referral.

## 2022-09-04 ENCOUNTER — Ambulatory Visit: Payer: BC Managed Care – PPO | Admitting: Dermatology

## 2022-09-09 DIAGNOSIS — H04123 Dry eye syndrome of bilateral lacrimal glands: Secondary | ICD-10-CM | POA: Diagnosis not present

## 2022-09-09 DIAGNOSIS — Z0100 Encounter for examination of eyes and vision without abnormal findings: Secondary | ICD-10-CM | POA: Diagnosis not present

## 2022-09-09 DIAGNOSIS — Z961 Presence of intraocular lens: Secondary | ICD-10-CM | POA: Diagnosis not present

## 2022-09-11 ENCOUNTER — Encounter: Payer: Self-pay | Admitting: Dermatology

## 2022-09-11 ENCOUNTER — Ambulatory Visit: Payer: BC Managed Care – PPO | Admitting: Dermatology

## 2022-09-11 VITALS — BP 116/70

## 2022-09-11 DIAGNOSIS — L570 Actinic keratosis: Secondary | ICD-10-CM

## 2022-09-11 DIAGNOSIS — R591 Generalized enlarged lymph nodes: Secondary | ICD-10-CM | POA: Diagnosis not present

## 2022-09-11 NOTE — Progress Notes (Signed)
   Follow-Up Visit   Subjective  Paul Pruitt is a 57 y.o. male who presents for the following: Actinic keratosis  Patient here today to treat AK's at right temple, left vertex scalp, left forehead.  The following portions of the chart were reviewed this encounter and updated as appropriate: medications, allergies, medical history  Review of Systems:  No other skin or systemic complaints except as noted in HPI or Assessment and Plan.  Objective  Well appearing patient in no apparent distress; mood and affect are within normal limits.  A focused examination was performed of the following areas: Face, scalp  Relevant exam findings are noted in the Assessment and Plan.    Assessment & Plan    ACTINIC KERATOSIS  Exam: Erythematous thin papules/macules with gritty scale  Actinic keratoses are precancerous spots that appear secondary to cumulative UV radiation exposure/sun exposure over time. They are chronic with expected duration over 1 year. A portion of actinic keratoses will progress to squamous cell carcinoma of the skin. It is not possible to reliably predict which spots will progress to skin cancer and so treatment is recommended to prevent development of skin cancer.  Recommend daily broad spectrum sunscreen SPF 30+ to sun-exposed areas, reapply every 2 hours as needed.  Recommend staying in the shade or wearing long sleeves, sun glasses (UVA+UVB protection) and wide brim hats (4-inch brim around the entire circumference of the hat). Call for new or changing lesions.  Treatment Plan: Destruction Procedure Note Destruction method: cryotherapy   Informed consent: discussed and consent obtained   Lesion destroyed using liquid nitrogen: Yes   Outcome: patient tolerated procedure well with no complications   Post-procedure details: wound care instructions given   Locations: right temple, left vertex scalp, left forehead. # of Lesions Treated: 3  Prior to procedure,  discussed risks of blister formation, small wound, skin dyspigmentation, or rare scar following cryotherapy. Recommend Vaseline ointment to treated areas while healing.  LYMPHADENOPATHY Ultrasound suggests benign lymphadenopathy Keep f/u with surgeon for evaluation  Return for TBSE, as scheduled, Hx MM, Hx Dysplastic Nevi.  Graciella Belton, RMA, am acting as scribe for Forest Gleason, MD .   Documentation: I have reviewed the above documentation for accuracy and completeness, and I agree with the above.  Forest Gleason, MD

## 2022-09-11 NOTE — Patient Instructions (Addendum)
Cryotherapy Aftercare  Wash gently with soap and water everyday.   Apply Vaseline and Band-Aid daily until healed.    Recommend taking Heliocare sun protection supplement daily in sunny weather for additional sun protection. For maximum protection on the sunniest days, you can take up to 2 capsules of regular Heliocare OR take 1 capsule of Heliocare Ultra. For prolonged exposure (such as a full day in the sun), you can repeat your dose of the supplement 4 hours after your first dose. Heliocare can be purchased at [facility], at some [facility] or at www.heliocare.com.      Due to recent changes in healthcare laws, you may see results of your pathology and/or laboratory studies on MyChart before the doctors have had a chance to review them. We understand that in some cases there may be results that are confusing or concerning to you. Please understand that not all results are received at the same time and often the doctors may need to interpret multiple results in order to provide you with the best plan of care or course of treatment. Therefore, we ask that you please give us 2 business days to thoroughly review all your results before contacting the office for clarification. Should we see a critical lab result, you will be contacted sooner.   If You Need Anything After Your Visit  If you have any questions or concerns for your doctor, please call our main line at [phone] and press option 4 to reach your doctor's medical assistant. If no one answers, please leave a voicemail as directed and we will return your call as soon as possible. Messages left after 4 pm will be answered the following business day.   You may also send us a message via MyChart. We typically respond to MyChart messages within 1-2 business days.  For prescription refills, please ask your pharmacy to contact our office. Our fax number is [phone].  If you have an urgent issue when the clinic is closed that  cannot wait until the next business day, you can page your doctor at the number below.    Please note that while we do our best to be available for urgent issues outside of office hours, we are not available 24/7.   If you have an urgent issue and are unable to reach us, you may choose to seek medical care at your doctor's office, retail clinic, urgent care center, or emergency room.  If you have a medical emergency, please immediately call 911 or go to the emergency department.  Pager Numbers  - Dr. Kowalski: [phone]  - Dr. Moye: [phone]  - Dr. Stewart: [phone]  In the event of inclement weather, please call our main line at [phone] for an update on the status of any delays or closures.  Dermatology Medication Tips: Please keep the boxes that topical medications come in in order to help keep track of the instructions about where and how to use these. Pharmacies typically print the medication instructions only on the boxes and not directly on the medication tubes.   If your medication is too expensive, please contact our office at [phone] option 4 or send us a message through MyChart.   We are unable to tell what your co-pay for medications will be in advance as this is different depending on your insurance coverage. However, we may be able to find a substitute medication at lower cost or fill out paperwork to get insurance to cover a needed medication.   If   a prior authorization is required to get your medication covered by your insurance company, please allow us 1-2 business days to complete this process.  Drug prices often vary depending on where the prescription is filled and some pharmacies may offer cheaper prices.  The website www.goodrx.com contains coupons for medications through different pharmacies. The prices here do not account for what the cost may be with help from insurance (it may be cheaper with your insurance), but the website can give you the  price if you did not use any insurance.  - You can print the associated coupon and take it with your prescription to the pharmacy.  - You may also stop by our office during regular business hours and pick up a GoodRx coupon card.  - If you need your prescription sent electronically to a different pharmacy, notify our office through [place] MyChart or by phone at [phone] option 4.     Si Usted Necesita Algo Despus de Su Visita  Tambin puede enviarnos un mensaje a travs de MyChart. Por lo general respondemos a los mensajes de MyChart en el transcurso de 1 a 2 das hbiles.  Para renovar recetas, por favor pida a su farmacia que se ponga en contacto con nuestra oficina. Nuestro nmero de fax es el [phone].  Si tiene un asunto urgente cuando la clnica est cerrada y que no puede esperar hasta el siguiente da hbil, puede llamar/localizar a su doctor(a) al nmero que aparece a continuacin.   Por favor, tenga en cuenta que aunque hacemos todo lo posible para estar disponibles para asuntos urgentes fuera del horario de oficina, no estamos disponibles las 24 horas del da, los 7 das de la semana.   Si tiene un problema urgente y no puede comunicarse con nosotros, puede optar por buscar atencin mdica  en el consultorio de su doctor(a), en una clnica privada, en un centro de atencin urgente o en una sala de emergencias.  Si tiene una emergencia mdica, por favor llame inmediatamente al 911 o vaya a la sala de emergencias.  Nmeros de bper  - Dr. Kowalski: [phone]  - Dra. Moye: [phone]  - Dra. Stewart: [phone]  En caso de inclemencias del tiempo, por favor llame a nuestra lnea principal al [phone] para una actualizacin sobre el estado de cualquier retraso o cierre.  Consejos para la medicacin en dermatologa: Por favor, guarde las cajas en las que vienen los medicamentos de uso tpico para ayudarle a seguir las instrucciones sobre dnde y cmo  usarlos. Las farmacias generalmente imprimen las instrucciones del medicamento slo en las cajas y no directamente en los tubos del medicamento.   Si su medicamento es muy caro, por favor, pngase en contacto con nuestra oficina llamando al [phone] y presione la opcin 4 o envenos un mensaje a travs de MyChart.   No podemos decirle cul ser su copago por los medicamentos por adelantado ya que esto es diferente dependiendo de la cobertura de su seguro. Sin embargo, es posible que podamos encontrar un medicamento sustituto a menor costo o llenar un formulario para que el seguro cubra el medicamento que se considera necesario.   Si se requiere una autorizacin previa para que su compaa de seguros cubra su medicamento, por favor permtanos de 1 a 2 das hbiles para completar este proceso.  Los precios de los medicamentos varan con frecuencia dependiendo del lugar de dnde se surte la receta y alguna farmacias pueden ofrecer precios ms baratos.  El sitio web www.goodrx.com tiene   cupones para medicamentos de diferentes farmacias. Los precios aqu no tienen en cuenta lo que podra costar con la ayuda del seguro (puede ser ms barato con su seguro), pero el sitio web puede darle el precio si no utiliz ningn seguro.  - Puede imprimir el cupn correspondiente y llevarlo con su receta a la farmacia.  - Tambin puede pasar por nuestra oficina durante el horario de atencin regular y recoger una tarjeta de cupones de GoodRx.  - Si necesita que su receta se enve electrnicamente a una farmacia diferente, informe a nuestra oficina a travs de MyChart de [place] o por telfono llamando al [phone] y presione la opcin 4.  

## 2022-09-15 ENCOUNTER — Ambulatory Visit: Payer: BC Managed Care – PPO | Admitting: Internal Medicine

## 2022-09-25 ENCOUNTER — Ambulatory Visit: Payer: BC Managed Care – PPO | Admitting: Urology

## 2022-09-25 ENCOUNTER — Other Ambulatory Visit: Payer: Self-pay | Admitting: Urology

## 2022-09-25 ENCOUNTER — Telehealth: Payer: Self-pay

## 2022-09-25 ENCOUNTER — Encounter: Payer: Self-pay | Admitting: Urology

## 2022-09-25 VITALS — BP 127/81 | HR 67 | Ht 76.0 in | Wt 205.1 lb

## 2022-09-25 DIAGNOSIS — Z01818 Encounter for other preprocedural examination: Secondary | ICD-10-CM

## 2022-09-25 DIAGNOSIS — N138 Other obstructive and reflux uropathy: Secondary | ICD-10-CM

## 2022-09-25 DIAGNOSIS — N401 Enlarged prostate with lower urinary tract symptoms: Secondary | ICD-10-CM

## 2022-09-25 DIAGNOSIS — R590 Localized enlarged lymph nodes: Secondary | ICD-10-CM | POA: Diagnosis not present

## 2022-09-25 LAB — URINALYSIS, COMPLETE
Bilirubin, UA: NEGATIVE
Glucose, UA: NEGATIVE
Ketones, UA: NEGATIVE
Leukocytes,UA: NEGATIVE
Nitrite, UA: NEGATIVE
Protein,UA: NEGATIVE
RBC, UA: NEGATIVE
Specific Gravity, UA: 1.025 (ref 1.005–1.030)
Urobilinogen, Ur: 0.2 mg/dL (ref 0.2–1.0)
pH, UA: 6 (ref 5.0–7.5)

## 2022-09-25 LAB — MICROSCOPIC EXAMINATION

## 2022-09-25 NOTE — Progress Notes (Signed)
Surgical Physician Order Form [facility] [place]  * Scheduling expectation : Next Available  *Length of Case:   *Clearance needed: no  *Anticoagulation Instructions: Hold all anticoagulants  *Aspirin Instructions: Hold Aspirin  *Post-op visit Date/Instructions:  4-6 week w/PVR  *Diagnosis: BPH w/urinary obstruction  *Procedure:  Urolift     Additional orders: N/A  -Admit type: OUTpatient  -Anesthesia: MAC  -VTE Prophylaxis Standing Order SCD's       Other:   -Standing Lab Orders Per Anesthesia    Lab other:  n/a  -Standing Test orders EKG/Chest x-ray per Anesthesia       Test other:   - Medications:  Ancef 2gm IV  -Other orders:  N/A

## 2022-09-25 NOTE — Progress Notes (Signed)
I, [name],acting as a scribe for Paul Scotland, MD.,have documented all relevant documentation on the behalf of Paul Scotland, MD,as directed by  Paul Scotland, MD while in the presence of Paul Scotland, MD.  09/25/2022 10:48 AM   [name] 1966-05-19 [id number]  Referring provider: Dale [place], MD [address] [place],  [place] [address]  Chief Complaint  Patient presents with   Benign Prostatic Hypertrophy    HPI: 57 year-old male with a personal history of BPH returns today for a follow-up.   He underwent Cystoscopy TRUS on 11/20/2021. Recommendation of a Urolift was made at that time. Hasn't had the procedure yet. He's back in today with ongoing urinary symptoms, including intermittency and weak stream. His urinalysis today is negative.   He is ready to proceed with the Urolift. He reports nocturia even if he stops drinking by 6 pm. As a result, he is not getting enough sleep. He denies any changes in overall medical history.   Results for orders placed or performed in visit on 09/25/22  Urinalysis, Complete  Result Value Ref Range   Scan Result 43 ml     PMH: Past Medical History:  Diagnosis Date   Dysplastic nevus 04/09/2015   left post lateral deltoid, mild atypia    Dysplastic nevus 05/17/2014   right prox med thigh, mild atypia    Dysplastic nevus 08/22/2008   right post shoulder, slight atypia    Dysplastic nevus 01/04/2007   R sup post lat thigh, slight atypia    Dysplastic nevus 09/15/2006   left post waistline paraspinal, severe atypia    Dysplastic nevus 07/09/2006   Left lower back, slight atypia    Dysplastic nevus 07/09/2006   right ear, moderate atypia    Melanoma 1995   L pectoral     Surgical History: Past Surgical History:  Procedure Laterality Date   BACK SURGERY N/A 2004, 2005   L3, 4 and L4,5   CATARACT EXTRACTION, BILATERAL     08/12/16 and 08/19/16.   COLONOSCOPY  02/07/2016   By dr Clovis Fredrickson  polyps, internal hemorrhoids, waiting path report   HERNIA REPAIR     LAMINECTOMY     x 2   MELANOMA EXCISION     chest area   TONSILLECTOMY      Home Medications:  Allergies as of 09/25/2022   No Known Allergies      Medication List        Accurate as of September 25, 2022 10:48 AM. If you have any questions, ask your nurse or doctor.          ibuprofen 800 MG tablet Commonly known as: ADVIL Take 1 tablet (800 mg total) by mouth 2 (two) times daily as needed.        Family History: Family History  Problem Relation Age of Onset   Arthritis Mother    Colon cancer Mother    Hypertension Father    Asthma Father    Hypertension Brother    Obesity Brother    Asthma Son    Obesity Son    Kidney disease Neg Hx    Prostate cancer Neg Hx     Social History:  reports that he has never smoked. He has never been exposed to tobacco smoke. He has never used smokeless tobacco. He reports that he does not currently use alcohol. He reports that he does not use drugs.   Physical Exam: BP 127/81   Pulse 67  Ht 6\' 4"  (1.93 m)   Wt 205 lb 2 oz (93 kg)   BMI 24.97 kg/m   Constitutional:  Alert and oriented, No acute distress. HEENT:  AT, moist mucus membranes.  Trachea midline, no masses. Neurologic: Grossly intact, no focal deficits, moving all 4 extremities. Psychiatric: Normal mood and affect.   Assessment & Plan:    BPH with obstruction/ LUTS  - His symptoms have worsened over the last year and is ready to proceed with Urolift. Brief explanation given of the procedure and recovery process again. Pre-op instructions given and UA sample taken. Able to do it as soon as Monday but he was considering early May. Will call him to schedule. -Preop / UCx  I have reviewed the above documentation for accuracy and completeness, and I agree with the above.   Paul Scotland, MD   [facility] [address] [place], [place] [address] [phone]

## 2022-09-25 NOTE — Telephone Encounter (Signed)
Tried to call patient to schedule surgery. Left message for patient to return my call. I will try again.

## 2022-09-30 LAB — CULTURE, URINE COMPREHENSIVE

## 2022-09-30 NOTE — Telephone Encounter (Signed)
Left message for patient to return my call to set up surgery. Will try again.  

## 2022-10-06 ENCOUNTER — Telehealth: Payer: Self-pay

## 2022-10-06 NOTE — Telephone Encounter (Signed)
Left message for patient to call back to set up surgery, will try again.

## 2022-10-07 ENCOUNTER — Telehealth: Payer: Self-pay

## 2022-10-07 NOTE — Telephone Encounter (Signed)
Patient called in wanting to know what to do about Urine Culture results, saw them in MyChart.

## 2022-10-08 NOTE — Progress Notes (Signed)
   [facility] Surgical Posting Form  Surgery Date: Date: 11/03/2022  Surgeon: Dr. Vanna Scotland, MD  Inpt ( No  )   Outpt (Yes)   Obs ( No  )   Diagnosis: N40.1, N13.8 Benign Prostatic Hyperplasia with Urinary Obstruction  -CPT: 52441, [phone]  Surgery: Cystoscopy with Insertion of Urolift  Stop Anticoagulations: Yes and also hold ASA  Cardiac/Medical/Pulmonary Clearance needed: no  *Orders entered into EPIC  Date: 10/08/22   *Case booked in EPIC  Date: 10/08/22  *Notified pt of Surgery: Date: 10/08/22  PRE-OP UA & CX:no  *Placed into Prior Authorization Work Angela Nevin Date: 10/08/22  Assistant/laser/rep:No

## 2022-10-08 NOTE — Telephone Encounter (Signed)
I spoke with Paul Pruitt. We have discussed possible surgery dates and Monday May 20th, 2024 was agreed upon by all parties. Patient given information about surgery date, what to expect pre-operatively and post operatively.  We discussed that a Pre-Admission Testing office will be calling to set up the pre-op visit that will take place prior to surgery, and that these appointments are typically done over the phone with a Pre-Admissions RN. Informed patient that our office will communicate any additional care to be provided after surgery. Patients questions or concerns were discussed during our call. Advised to call our office should there be any additional information, questions or concerns that arise. Patient verbalized understanding.

## 2022-10-08 NOTE — Telephone Encounter (Signed)
Spoke with pt. Pt. Advised and verbalized understanding.  ?

## 2022-10-27 ENCOUNTER — Encounter
Admission: RE | Admit: 2022-10-27 | Discharge: 2022-10-27 | Disposition: A | Discharge status: Home / Self Care | Payer: BC Managed Care – PPO | Source: Ambulatory Visit | Attending: Urology | Admitting: Urology

## 2022-10-27 VITALS — Ht 76.0 in | Wt 207.2 lb

## 2022-10-27 DIAGNOSIS — R002 Palpitations: Secondary | ICD-10-CM

## 2022-10-27 DIAGNOSIS — I77810 Thoracic aortic ectasia: Secondary | ICD-10-CM

## 2022-10-27 DIAGNOSIS — Z01810 Encounter for preprocedural cardiovascular examination: Secondary | ICD-10-CM

## 2022-10-27 DIAGNOSIS — R0602 Shortness of breath: Secondary | ICD-10-CM

## 2022-10-27 HISTORY — DX: Thrombocytopenia, unspecified: D69.6

## 2022-10-27 HISTORY — DX: COVID-19: U07.1

## 2022-10-27 HISTORY — DX: Ventricular premature depolarization: I49.3

## 2022-10-27 HISTORY — DX: Scoliosis, unspecified: M41.9

## 2022-10-27 HISTORY — DX: Radiculopathy, lumbar region: M54.16

## 2022-10-27 HISTORY — DX: Other obstructive and reflux uropathy: N40.1

## 2022-10-27 HISTORY — DX: Other obstructive and reflux uropathy: N13.8

## 2022-10-27 HISTORY — DX: Dyspnea, unspecified: R06.00

## 2022-10-27 HISTORY — DX: Generalized enlarged lymph nodes: R59.1

## 2022-10-27 HISTORY — DX: Thoracic aortic ectasia: I77.810

## 2022-10-27 HISTORY — DX: Bradycardia, unspecified: R00.1

## 2022-10-27 HISTORY — DX: Vitamin D deficiency, unspecified: E55.9

## 2022-10-27 HISTORY — DX: Deficiency of other specified B group vitamins: E53.8

## 2022-10-27 HISTORY — DX: Palpitations: R00.2

## 2022-10-27 NOTE — Pre-Procedure Instructions (Signed)
Called pt to do his anesthesia preop interview. Pt informed that he needed an EKG to be done prior to surgery on 11-03-22 with Dr Apolinar Junes. Informed pt sooner than later. Pt states the only day he can do this is Thursday 5-16. I explained to pt that is very close to his surgery date and if EKG is abnormal the surgery could be cancelled. Pt states he understands this and this is an elective procedure and he is ok with this if surgery was to get cancelled due to needing clearance.

## 2022-10-27 NOTE — Patient Instructions (Signed)
Your procedure is scheduled on:11-03-22 Monday Report to the Registration Desk on the 1st floor of the Medical Mall.Then proceed to the 2nd floor Surgery Desk To find out your arrival time, please call [phone] between 1PM - 3PM on:10-31-22 Friday If your arrival time is 6:00 am, do not arrive before that time as the Medical Mall entrance doors do not open until 6:00 am.  REMEMBER: Instructions that are not followed completely may result in serious medical risk, up to and including death; or upon the discretion of your surgeon and anesthesiologist your surgery may need to be rescheduled.  Do not eat food OR drink any liquids after midnight the night before surgery.  No gum chewing or hard candies.  One week prior to surgery: Stop Anti-inflammatories (NSAIDS) such as Advil, Aleve, Ibuprofen, Motrin, Naproxen, Naprosyn and Aspirin based products such as Excedrin, Goody's Powder, BC Powder.You may however, take Tylenol if needed for pain up until the day of surgery. Stop ANY OVER THE COUNTER supplements/vitamins NOW (10-27-22) until after surgery.  Do NOT take any medication the morning of surgery  No Alcohol for 24 hours before or after surgery.  No Smoking including e-cigarettes for 24 hours before surgery.  No chewable tobacco products for at least 6 hours before surgery.  No nicotine patches on the day of surgery.  Do not use any "recreational" drugs for at least a week (preferably 2 weeks) before your surgery.  Please be advised that the combination of cocaine and anesthesia may have negative outcomes, up to and including death. If you test positive for cocaine, your surgery will be cancelled.  On the morning of surgery brush your teeth with toothpaste and water, you may rinse your mouth with mouthwash if you wish. Do not swallow any toothpaste or mouthwash.  Do not wear jewelry, make-up, hairpins, clips or nail polish.  Do not wear lotions, powders, or perfumes.   Do not shave  body hair from the neck down 48 hours before surgery.  Contact lenses, hearing aids and dentures may not be worn into surgery.  Do not bring valuables to the hospital. [facility] is not responsible for any missing/lost belongings or valuables.   Notify your doctor if there is any change in your medical condition (cold, fever, infection).  Wear comfortable clothing (specific to your surgery type) to the hospital.  After surgery, you can help prevent lung complications by doing breathing exercises.  Take deep breaths and cough every 1-2 hours. Your doctor may order a device called an Incentive Spirometer to help you take deep breaths. When coughing or sneezing, hold a pillow firmly against your incision with both hands. This is called "splinting." Doing this helps protect your incision. It also decreases belly discomfort.  If you are being admitted to the hospital overnight, leave your suitcase in the car. After surgery it may be brought to your room.  In case of increased patient census, it may be necessary for you, the patient, to continue your postoperative care in the Same Day Surgery department.  If you are being discharged the day of surgery, you will not be allowed to drive home. You will need a responsible individual to drive you home and stay with you for 24 hours after surgery.   If you are taking public transportation, you will need to have a responsible individual with you.  Please call the Pre-admissions Testing Dept. at [phone] if you have any questions about these instructions.  Surgery Visitation Policy:  Patients  having surgery or a procedure may have two visitors.  Children under the age of 9 must have an adult with them who is not the patient.

## 2022-10-30 ENCOUNTER — Encounter
Admission: RE | Admit: 2022-10-30 | Discharge: 2022-10-30 | Disposition: A | Discharge status: Home / Self Care | Payer: BC Managed Care – PPO | Source: Ambulatory Visit | Attending: Urology | Admitting: Urology

## 2022-10-30 DIAGNOSIS — Z01818 Encounter for other preprocedural examination: Secondary | ICD-10-CM | POA: Diagnosis not present

## 2022-10-30 DIAGNOSIS — Z01810 Encounter for preprocedural cardiovascular examination: Secondary | ICD-10-CM | POA: Diagnosis not present

## 2022-10-30 DIAGNOSIS — I77810 Thoracic aortic ectasia: Secondary | ICD-10-CM | POA: Diagnosis not present

## 2022-10-30 DIAGNOSIS — R002 Palpitations: Secondary | ICD-10-CM | POA: Diagnosis not present

## 2022-10-30 DIAGNOSIS — R0602 Shortness of breath: Secondary | ICD-10-CM | POA: Insufficient documentation

## 2022-10-30 DIAGNOSIS — R001 Bradycardia, unspecified: Secondary | ICD-10-CM | POA: Insufficient documentation

## 2022-11-03 ENCOUNTER — Encounter
Admission: RE | Disposition: A | Discharge status: Home / Self Care | Payer: Self-pay | Source: Home / Self Care | Attending: Urology

## 2022-11-03 ENCOUNTER — Ambulatory Visit: Payer: BC Managed Care – PPO | Admitting: General Practice

## 2022-11-03 ENCOUNTER — Other Ambulatory Visit: Payer: Self-pay

## 2022-11-03 ENCOUNTER — Encounter: Payer: Self-pay | Admitting: Emergency Medicine

## 2022-11-03 ENCOUNTER — Ambulatory Visit: Payer: BC Managed Care – PPO | Admitting: Urgent Care

## 2022-11-03 ENCOUNTER — Ambulatory Visit (INDEPENDENT_AMBULATORY_CARE_PROVIDER_SITE_OTHER): Payer: BC Managed Care – PPO | Admitting: Physician Assistant

## 2022-11-03 ENCOUNTER — Encounter: Payer: Self-pay | Admitting: Urology

## 2022-11-03 ENCOUNTER — Ambulatory Visit
Admission: RE | Admit: 2022-11-03 | Discharge: 2022-11-03 | Disposition: A | Discharge status: Home / Self Care | Payer: BC Managed Care – PPO | Attending: Urology | Admitting: Urology

## 2022-11-03 VITALS — BP 122/76 | HR 49 | Ht 76.0 in | Wt 198.0 lb

## 2022-11-03 DIAGNOSIS — G709 Myoneural disorder, unspecified: Secondary | ICD-10-CM | POA: Insufficient documentation

## 2022-11-03 DIAGNOSIS — R1030 Lower abdominal pain, unspecified: Secondary | ICD-10-CM | POA: Insufficient documentation

## 2022-11-03 DIAGNOSIS — N138 Other obstructive and reflux uropathy: Secondary | ICD-10-CM | POA: Diagnosis not present

## 2022-11-03 DIAGNOSIS — T819XXA Unspecified complication of procedure, initial encounter: Secondary | ICD-10-CM | POA: Insufficient documentation

## 2022-11-03 DIAGNOSIS — R3912 Poor urinary stream: Secondary | ICD-10-CM | POA: Insufficient documentation

## 2022-11-03 DIAGNOSIS — R39198 Other difficulties with micturition: Secondary | ICD-10-CM | POA: Insufficient documentation

## 2022-11-03 DIAGNOSIS — R300 Dysuria: Secondary | ICD-10-CM | POA: Insufficient documentation

## 2022-11-03 DIAGNOSIS — R319 Hematuria, unspecified: Secondary | ICD-10-CM | POA: Insufficient documentation

## 2022-11-03 DIAGNOSIS — Z8616 Personal history of COVID-19: Secondary | ICD-10-CM | POA: Insufficient documentation

## 2022-11-03 DIAGNOSIS — N401 Enlarged prostate with lower urinary tract symptoms: Secondary | ICD-10-CM

## 2022-11-03 HISTORY — PX: CYSTOSCOPY WITH INSERTION OF UROLIFT: SHX6678

## 2022-11-03 LAB — COMPREHENSIVE METABOLIC PANEL
ALT: 40 U/L (ref 0–44)
AST: 43 U/L — ABNORMAL HIGH (ref 15–41)
Albumin: 4 g/dL (ref 3.5–5.0)
Alkaline Phosphatase: 59 U/L (ref 38–126)
Anion gap: 8 (ref 5–15)
BUN: 23 mg/dL — ABNORMAL HIGH (ref 6–20)
CO2: 25 mmol/L (ref 22–32)
Calcium: 8.9 mg/dL (ref 8.9–10.3)
Chloride: 102 mmol/L (ref 98–111)
Creatinine, Ser: 0.86 mg/dL (ref 0.61–1.24)
GFR, Estimated: >60 mL/min (ref >60)
Glucose, Bld: 107 mg/dL — ABNORMAL HIGH (ref 70–99)
Potassium: 4 mmol/L (ref 3.5–5.1)
Sodium: 135 mmol/L (ref 135–145)
Total Bilirubin: 1 mg/dL (ref 0.3–1.2)
Total Protein: 6.5 g/dL (ref 6.5–8.1)

## 2022-11-03 LAB — CBC WITH DIFFERENTIAL/PLATELET
Abs Immature Granulocytes: 0.02 10*3/uL (ref 0.00–0.07)
Basophils Absolute: 0 10*3/uL (ref 0.0–0.1)
Basophils Relative: 0 %
Eosinophils Absolute: 0.1 10*3/uL (ref 0.0–0.5)
Eosinophils Relative: 1 %
HCT: 41.8 % (ref 39.0–52.0)
Hemoglobin: 14.1 g/dL (ref 13.0–17.0)
Immature Granulocytes: 0 %
Lymphocytes Relative: 22 %
Lymphs Abs: 1.3 10*3/uL (ref 0.7–4.0)
MCH: 30.5 pg (ref 26.0–34.0)
MCHC: 33.7 g/dL (ref 30.0–36.0)
MCV: 90.3 fL (ref 80.0–100.0)
Monocytes Absolute: 0.5 10*3/uL (ref 0.1–1.0)
Monocytes Relative: 8 %
Neutro Abs: 4 10*3/uL (ref 1.7–7.7)
Neutrophils Relative %: 69 %
Platelets: 164 10*3/uL (ref 150–400)
RBC: 4.63 MIL/uL (ref 4.22–5.81)
RDW: 11.5 % (ref 11.5–15.5)
WBC: 5.9 10*3/uL (ref 4.0–10.5)
nRBC: 0 % (ref 0.0–0.2)

## 2022-11-03 LAB — BLADDER SCAN AMB NON-IMAGING: Scan Result: 34

## 2022-11-03 LAB — URINALYSIS, ROUTINE W REFLEX MICROSCOPIC
Bacteria, UA: NONE SEEN
Bilirubin Urine: NEGATIVE
Glucose, UA: NEGATIVE mg/dL
Ketones, ur: NEGATIVE mg/dL
Leukocytes,Ua: NEGATIVE
Nitrite: POSITIVE — AB
Protein, ur: 100 mg/dL — AB
RBC / HPF: >50 RBC/hpf (ref 0–5)
Specific Gravity, Urine: 1.018 (ref 1.005–1.030)
Squamous Epithelial / HPF: NONE SEEN /HPF (ref 0–5)
pH: 8 (ref 5.0–8.0)

## 2022-11-03 SURGERY — CYSTOSCOPY WITH INSERTION OF UROLIFT
Anesthesia: General | Site: Prostate

## 2022-11-03 MED ORDER — DIAZEPAM 5 MG PO TABS
ORAL_TABLET | ORAL | 0 refills | Status: DC
Start: 2022-11-03 — End: 2023-01-14

## 2022-11-03 MED ORDER — ONDANSETRON HCL 4 MG/2ML IJ SOLN
INTRAMUSCULAR | Status: DC | PRN
  Administered 2022-11-03: 4 mg via INTRAVENOUS

## 2022-11-03 MED ORDER — PROPOFOL 10 MG/ML IV BOLUS
INTRAVENOUS | Status: DC | PRN
  Administered 2022-11-03: 50 mg via INTRAVENOUS

## 2022-11-03 MED ORDER — FAMOTIDINE 20 MG PO TABS
20.0000 mg | ORAL_TABLET | Freq: Once | ORAL | Status: AC
  Administered 2022-11-03: 20 mg via ORAL

## 2022-11-03 MED ORDER — FAMOTIDINE 20 MG PO TABS
ORAL_TABLET | ORAL | Status: AC
  Filled 2022-11-03: qty 1

## 2022-11-03 MED ORDER — LACTATED RINGERS IV SOLN: INTRAVENOUS | Status: DC

## 2022-11-03 MED ORDER — OXYCODONE HCL 5 MG PO TABS
5.0000 mg | ORAL_TABLET | Freq: Once | ORAL | Status: AC | PRN
  Administered 2022-11-03: 5 mg via ORAL

## 2022-11-03 MED ORDER — PROPOFOL 500 MG/50ML IV EMUL
INTRAVENOUS | Status: DC | PRN
  Administered 2022-11-03: 150 ug/kg/min via INTRAVENOUS

## 2022-11-03 MED ORDER — CHLORHEXIDINE GLUCONATE 0.12 % MT SOLN
15.0000 mL | Freq: Once | OROMUCOSAL | Status: AC
  Administered 2022-11-03: 15 mL via OROMUCOSAL

## 2022-11-03 MED ORDER — PROPOFOL 1000 MG/100ML IV EMUL
INTRAVENOUS | Status: AC
  Filled 2022-11-03: qty 100

## 2022-11-03 MED ORDER — OXYCODONE HCL 5 MG PO TABS
ORAL_TABLET | ORAL | Status: AC
  Filled 2022-11-03: qty 1

## 2022-11-03 MED ORDER — OXYCODONE HCL 5 MG PO TABS
5.0000 mg | ORAL_TABLET | Freq: Four times a day (QID) | ORAL | 0 refills | Status: DC | PRN
Start: 2022-11-03 — End: 2023-01-14

## 2022-11-03 MED ORDER — FENTANYL CITRATE (PF) 100 MCG/2ML IJ SOLN: 25.0000 ug | INTRAMUSCULAR | Status: DC | PRN

## 2022-11-03 MED ORDER — SODIUM CHLORIDE 0.9 % IR SOLN
Status: DC | PRN
  Administered 2022-11-03: 3000 mL

## 2022-11-03 MED ORDER — ONDANSETRON 4 MG PO TBDP
4.0000 mg | ORAL_TABLET | Freq: Three times a day (TID) | ORAL | 0 refills | Status: DC | PRN
Start: 2022-11-03 — End: 2023-01-14

## 2022-11-03 MED ORDER — OXYCODONE HCL 5 MG/5ML PO SOLN: 5.0000 mg | Freq: Once | ORAL | Status: AC | PRN

## 2022-11-03 MED ORDER — CHLORHEXIDINE GLUCONATE 0.12 % MT SOLN
OROMUCOSAL | Status: AC
  Filled 2022-11-03: qty 15

## 2022-11-03 MED ORDER — CEFAZOLIN SODIUM-DEXTROSE 2-4 GM/100ML-% IV SOLN
INTRAVENOUS | Status: AC
  Filled 2022-11-03: qty 100

## 2022-11-03 MED ORDER — ORAL CARE MOUTH RINSE: 15.0000 mL | Freq: Once | OROMUCOSAL | Status: AC

## 2022-11-03 MED ORDER — MIDAZOLAM HCL 2 MG/2ML IJ SOLN
INTRAMUSCULAR | Status: AC
  Filled 2022-11-03: qty 2

## 2022-11-03 MED ORDER — LIDOCAINE HCL (CARDIAC) PF 100 MG/5ML IV SOSY
PREFILLED_SYRINGE | INTRAVENOUS | Status: DC | PRN
  Administered 2022-11-03: 50 mg via INTRAVENOUS

## 2022-11-03 MED ORDER — CEFAZOLIN SODIUM-DEXTROSE 2-4 GM/100ML-% IV SOLN
2.0000 g | INTRAVENOUS | Status: AC
  Administered 2022-11-03: 2 g via INTRAVENOUS

## 2022-11-03 MED ORDER — MIDAZOLAM HCL 2 MG/2ML IJ SOLN
INTRAMUSCULAR | Status: DC | PRN
  Administered 2022-11-03: 2 mg via INTRAVENOUS

## 2022-11-03 SURGICAL SUPPLY — 17 items
BAG DRAIN SIEMENS DORNER NS (MISCELLANEOUS) ×1 IMPLANT
BAG DRN NS LF (MISCELLANEOUS) ×1
GAUZE 4X4 16PLY ~~LOC~~+RFID DBL (SPONGE) ×2 IMPLANT
GLOVE BIO SURGEON STRL SZ 6.5 (GLOVE) ×1 IMPLANT
GOWN STRL REUS W/ TWL LRG LVL3 (GOWN DISPOSABLE) ×2 IMPLANT
GOWN STRL REUS W/TWL LRG LVL3 (GOWN DISPOSABLE) ×2
KIT TURNOVER CYSTO (KITS) ×1 IMPLANT
MANIFOLD NEPTUNE II (INSTRUMENTS) ×1 IMPLANT
PACK CYSTO AR (MISCELLANEOUS) ×1 IMPLANT
SET CYSTO W/LG BORE CLAMP LF (SET/KITS/TRAYS/PACK) ×1 IMPLANT
SURGILUBE 2OZ TUBE FLIPTOP (MISCELLANEOUS) IMPLANT
SYSTEM UROLIFT 2 CART W/ HNDL (Male Continence) ×1 IMPLANT
SYSTEM UROLIFT 2 CARTRIDGE (Male Continence) IMPLANT
TRAP FLUID SMOKE EVACUATOR (MISCELLANEOUS) ×1 IMPLANT
WATER STERILE IRR 1000ML POUR (IV SOLUTION) ×1 IMPLANT
WATER STERILE IRR 3000ML UROMA (IV SOLUTION) ×1 IMPLANT
WATER STERILE IRR 500ML POUR (IV SOLUTION) ×1 IMPLANT

## 2022-11-03 NOTE — Progress Notes (Signed)
Pt's spouse called stating that her husband was having a pain 10 out of 10. And that he felt like wanting to pee but could not pee and that he was having frank blood coming out. MD was notified, pt was instructed to go to the Office to be seen ASAP.

## 2022-11-03 NOTE — ED Triage Notes (Signed)
Pt presents ambulatory to triage via POV with complaints of post op pain and urinary urgency following a cystoscopy with insertion of urolift this AM. Pt endorses some blood clots while using the restroom. Pt was seen by the urologist this evening who obtained a bladder scan; prescribed Valium suppository. A&Ox4 at this time. Denies fevers, chills, N/V/D, CP or SOB.

## 2022-11-03 NOTE — H&P (Signed)
Paul Pruitt [birth year] [id number]  [facility] CTAB   Referring provider: Dale [place], MD [address] [place],  [place] [address]      Chief Complaint  Patient presents with   Benign Prostatic Hypertrophy      HPI: 57 year-old male with a personal history of BPH returns today for a follow-up.    He underwent Cystoscopy TRUS on 11/20/2021. Recommendation of a Urolift was made at that time. Hasn't had the procedure yet. He's back in today with ongoing urinary symptoms, including intermittency and weak stream. His urinalysis today is negative.    He is ready to proceed with the Urolift. He reports nocturia even if he stops drinking by 6 pm. As a result, he is not getting enough sleep. He denies any changes in overall medical history.         Results for orders placed or performed in visit on 09/25/22  Urinalysis, Complete  Result Value Ref Range    Scan Result 43 ml        PMH:     Past Medical History:  Diagnosis Date   Dysplastic nevus 04/09/2015    left post lateral deltoid, mild atypia    Dysplastic nevus 05/17/2014    right prox med thigh, mild atypia    Dysplastic nevus 08/22/2008    right post shoulder, slight atypia    Dysplastic nevus 01/04/2007    R sup post lat thigh, slight atypia    Dysplastic nevus 09/15/2006    left post waistline paraspinal, severe atypia    Dysplastic nevus 07/09/2006    Left lower back, slight atypia    Dysplastic nevus 07/09/2006    right ear, moderate atypia    Melanoma 1995    L pectoral       Surgical History:      Past Surgical History:  Procedure Laterality Date   BACK SURGERY N/A 2004, 2005    L3, 4 and L4,5   CATARACT EXTRACTION, BILATERAL        08/12/16 and 08/19/16.   COLONOSCOPY   02/07/2016    By dr Clovis Fredrickson polyps, internal hemorrhoids, waiting path report   HERNIA REPAIR       LAMINECTOMY        x 2   MELANOMA EXCISION        chest area   TONSILLECTOMY          Home Medications:   Allergies as of 09/25/2022   No Known Allergies         Medication List           Accurate as of September 25, 2022 10:48 AM. If you have any questions, ask your nurse or doctor.              ibuprofen 800 MG tablet Commonly known as: ADVIL Take 1 tablet (800 mg total) by mouth 2 (two) times daily as needed.             Family History:      Family History  Problem Relation Age of Onset   Arthritis Mother     Colon cancer Mother     Hypertension Father     Asthma Father     Hypertension Brother     Obesity Brother     Asthma Son     Obesity Son     Kidney disease Neg Hx     Prostate cancer Neg Hx        Social History:  reports that he has never smoked. He has never been exposed to tobacco smoke. He has never used smokeless tobacco. He reports that he does not currently use alcohol. He reports that he does not use drugs.     Physical Exam: BP 127/81   Pulse 67   Ht 6\' 4"  (1.93 m)   Wt 205 lb 2 oz (93 kg)   BMI 24.97 kg/m   Constitutional:  Alert and oriented, No acute distress. HEENT: [place] AT, moist mucus membranes.  Trachea midline, no masses. Neurologic: Grossly intact, no focal deficits, moving all 4 extremities. Psychiatric: Normal mood and affect.     Assessment & Plan:     BPH with obstruction/ LUTS  - His symptoms have worsened over the last year and is ready to proceed with Urolift. Brief explanation given of the procedure and recovery process again. Pre-op instructions given and UA sample taken. Able to do it as soon as Monday but he was considering early May. Will call him to schedule. -Preop / UCx   I have reviewed the above documentation for accuracy and completeness, and I agree with the above.    Vanna Scotland, MD     [facility] [address] [place], [place] [address] [phone]

## 2022-11-03 NOTE — Anesthesia Preprocedure Evaluation (Signed)
Anesthesia Evaluation  Patient identified by MRN, date of birth, ID band Patient awake    Reviewed: Allergy & Precautions, NPO status , Patient's Chart, lab work & pertinent test results  Airway Mallampati: III  TM Distance: >3 FB Neck ROM: full    Dental  (+) Dental Advidsory Given, Teeth Intact   Pulmonary neg pulmonary ROS   Pulmonary exam normal        Cardiovascular negative cardio ROS Normal cardiovascular exam     Neuro/Psych  Headaches  Neuromuscular disease  negative psych ROS   GI/Hepatic negative GI ROS, Neg liver ROS,,,  Endo/Other  negative endocrine ROS    Renal/GU negative Renal ROS  negative genitourinary   Musculoskeletal   Abdominal   Peds  Hematology negative hematology ROS (+)   Anesthesia Other Findings Past Medical History: No date: Aortic root dilatation (HCC)     Comment:  4.3 cm on echo in 2022 No date: B12 deficiency No date: BPH with obstruction/lower urinary tract symptoms No date: Bradycardia     Comment:  pt is a runner and very active with [employer] No date: Chronic left-sided lumbar radiculopathy No date: COVID-19 04/09/2015: Dysplastic nevus     Comment:  left post lateral deltoid, mild atypia  05/17/2014: Dysplastic nevus     Comment:  right prox med thigh, mild atypia  08/22/2008: Dysplastic nevus     Comment:  right post shoulder, slight atypia  01/04/2007: Dysplastic nevus     Comment:  R sup post lat thigh, slight atypia  09/15/2006: Dysplastic nevus     Comment:  left post waistline paraspinal, severe atypia  07/09/2006: Dysplastic nevus     Comment:  Left lower back, slight atypia  07/09/2006: Dysplastic nevus     Comment:  right ear, moderate atypia  No date: Dyspnea No date: Heart palpitations No date: Lymphadenopathy 1995: Melanoma (HCC)     Comment:  L pectoral  No date: PVC (premature ventricular contraction) No date: Scoliosis No date: Thrombocytopenia  (HCC) No date: Vitamin D deficiency  Past Surgical History: 2004, 2005: BACK SURGERY; N/A     Comment:  L3, 4 and L4,5 No date: CATARACT EXTRACTION, BILATERAL     Comment:  08/12/16 and 08/19/16. 02/07/2016: COLONOSCOPY     Comment:  By dr Clovis Fredrickson polyps, internal hemorrhoids, waiting               path report No date: HERNIA REPAIR No date: LAMINECTOMY     Comment:  x 2 No date: MELANOMA EXCISION     Comment:  chest area No date: TONSILLECTOMY  BMI    Body Mass Index: 24.10 kg/m      Reproductive/Obstetrics negative OB ROS                             Anesthesia Physical Anesthesia Plan  ASA: 2  Anesthesia Plan: General   Post-op Pain Management:    Induction: Intravenous  PONV Risk Score and Plan: 3 and Propofol infusion, Midazolam, TIVA and Ondansetron  Airway Management Planned: Natural Airway and Nasal Cannula  Additional Equipment:   Intra-op Plan:   Post-operative Plan:   Informed Consent: I have reviewed the patients History and Physical, chart, labs and discussed the procedure including the risks, benefits and alternatives for the proposed anesthesia with the patient or authorized representative who has indicated his/her understanding and acceptance.     Dental Advisory Given  Plan Discussed with: Anesthesiologist, CRNA and  Surgeon  Anesthesia Plan Comments: (Patient consented for risks of anesthesia including but not limited to:  - adverse reactions to medications - risk of airway placement if required - damage to eyes, teeth, lips or other oral mucosa - nerve damage due to positioning  - sore throat or hoarseness - Damage to heart, brain, nerves, lungs, other parts of body or loss of life  Patient voiced understanding.)       Anesthesia Quick Evaluation

## 2022-11-03 NOTE — Op Note (Signed)
Preoperative diagnosis: BPH with obstructive symptomatology   Postoperative diagnosis: BPH with obstructive symptomatology   Principal procedure: Urolift procedure, with the placement of 6 implants.   Surgeon: Vanna Scotland   Anesthesia: MAC   Complications: None   Drains: None   Estimated blood loss: < 5 mL   Indications: 57 year-old male with obstructive symptomatology secondary to BPH.  The patient's symptoms have progressed, and he has requested further management.  Management options including TURP with resection/ablation of the prostate as well as Urolift were discussed.  The patient has chosen to have a Urolift procedure.  He has been instructed to the procedure as well as risks and complications which include but are not limited to infection, bleeding, and inadequate treatment with the Urolift procedure alone, anesthetic complications, among others.  He understands these and desires to proceed.   Findings: Using the 17 French cystoscope, urethra and bladder were inspected.  There were no urethral lesions.  Prostatic urethra was obstructed secondary to bilobar hypertrophy.  The bladder was inspected circumferentially.  This revealed normal findings.   Description of procedure: The patient was properly identified in the holding area.  He received preoperative IV antibiotics.  He was taken to the operating room where MAC.  He is placed in the dorsolithotomy position.  Genitalia and perineum were prepped and draped.  Proper timeout was performed.   A 38F cystoscope was inserted into the bladder with findings as described above.  The 1st pair of implants were placed at the bladder neck ~1.5 cm from the bladder Neck. The 2nd pair of implants were placed at the level of the verumontanum.   A repeat cysto was performed and another pair of implants in between the prior pairs.    A final cystoscopy was conducted first to inspect the location and state of each implant and second, to confirm  the presence of a continuous anterior channel was present through the prostatic urethra with irrigation flow turned off.    6 implants were delivered in total.    Following this, the scope was removed.  After anesthetic reversal he was transported to the PACU in stable condition.  He tolerated the procedure well.   Plan: He will follow-up with me in 4 to 6 weeks for IPSS/PVR.

## 2022-11-03 NOTE — Discharge Instructions (Addendum)
Urolift Post-Operative Instructions ? ? ?  ?Patient Expectations ? ? ?1. Mild blood in your urine for about 1 week. ? ?2. Urinary buring, frequency, and urgency for 10 days. ? ?3. Mild pelvic pain 1-2 weeks. ? ? ? ? ?Return to Activity ? ?  ? ?1. Drink water post procedure. ? ?2. Take meds as needed.  Tylenol and/or Motrin is most helpful.  You may also by Pyridium/Azo over-the-counter for urinary burning. ? ?3. No lifting or straining 48hrs. ? ?4. Other activity when they feel up to it. ? ? ?AMBULATORY SURGERY  ?DISCHARGE INSTRUCTIONS ? ? ?The drugs that you were given will stay in your system until tomorrow so for the next 24 hours you should not: ? ?Drive an automobile ?Make any legal decisions ?Drink any alcoholic beverage ? ? ?You may resume regular meals tomorrow.  Today it is better to start with liquids and gradually work up to solid foods. ? ?You may eat anything you prefer, but it is better to start with liquids, then soup and crackers, and gradually work up to solid foods. ? ? ?Please notify your doctor immediately if you have any unusual bleeding, trouble breathing, redness and pain at the surgery site, drainage, fever, or pain not relieved by medication. ? ? ? ?Additional Instructions: ? ? ?Please contact your physician with any problems or Same Day Surgery at [phone], Monday through Friday 6 am to 4 pm, or [facility] Main number at [phone].  ?

## 2022-11-03 NOTE — Transfer of Care (Signed)
Immediate Anesthesia Transfer of Care Note  Patient: Paul Pruitt  Procedure(s) Performed: CYSTOSCOPY WITH INSERTION OF UROLIFT (Prostate)  Patient Location: PACU  Anesthesia Type:General  Level of Consciousness: drowsy  Airway & Oxygen Therapy: Patient Spontanous Breathing  Post-op Assessment: Report given to RN and Post -op Vital signs reviewed and stable  Post vital signs: Reviewed and stable  Last Vitals:  Vitals Value Taken Time  BP 113/82 11/03/22 0900  Temp    Pulse 50 11/03/22 0900  Resp 14 11/03/22 0900  SpO2 97 % 11/03/22 0900    Last Pain:  Vitals:   11/03/22 0749  TempSrc: Oral  PainSc: 0-No pain         Complications: No notable events documented.

## 2022-11-03 NOTE — Anesthesia Postprocedure Evaluation (Signed)
Anesthesia Post Note  Patient: Paul Pruitt  Procedure(s) Performed: CYSTOSCOPY WITH INSERTION OF UROLIFT (Prostate)  Patient location during evaluation: PACU Anesthesia Type: General Level of consciousness: awake and alert Pain management: pain level controlled Vital Signs Assessment: post-procedure vital signs reviewed and stable Respiratory status: spontaneous breathing, nonlabored ventilation, respiratory function stable and patient connected to nasal cannula oxygen Cardiovascular status: blood pressure returned to baseline and stable Postop Assessment: no apparent nausea or vomiting Anesthetic complications: no  No notable events documented.   Last Vitals:  Vitals:   11/03/22 0900 11/03/22 0915  BP: 113/82 (!) 121/55  Pulse: (!) 50 (!) 48  Resp: 14 11  Temp: (!) 36.2 C   SpO2: 97% 100%    Last Pain:  Vitals:   11/03/22 0900  TempSrc:   PainSc: Asleep                 Stephanie Coup

## 2022-11-04 ENCOUNTER — Emergency Department
Admission: EM | Admit: 2022-11-04 | Discharge: 2022-11-04 | Disposition: A | Discharge status: Home / Self Care | Payer: BC Managed Care – PPO | Source: Home / Self Care | Attending: Emergency Medicine | Admitting: Emergency Medicine

## 2022-11-04 ENCOUNTER — Ambulatory Visit (INDEPENDENT_AMBULATORY_CARE_PROVIDER_SITE_OTHER): Payer: BC Managed Care – PPO | Admitting: Physician Assistant

## 2022-11-04 ENCOUNTER — Encounter: Payer: Self-pay | Admitting: Physician Assistant

## 2022-11-04 VITALS — BP 119/76 | HR 57 | Ht 76.0 in | Wt 198.0 lb

## 2022-11-04 DIAGNOSIS — N138 Other obstructive and reflux uropathy: Secondary | ICD-10-CM | POA: Diagnosis not present

## 2022-11-04 DIAGNOSIS — N401 Enlarged prostate with lower urinary tract symptoms: Secondary | ICD-10-CM

## 2022-11-04 DIAGNOSIS — R319 Hematuria, unspecified: Secondary | ICD-10-CM

## 2022-11-04 DIAGNOSIS — R300 Dysuria: Secondary | ICD-10-CM

## 2022-11-04 LAB — BLADDER SCAN AMB NON-IMAGING: Scan Result: 38

## 2022-11-04 MED ORDER — DIAZEPAM 2 MG PO TABS
2.0000 mg | ORAL_TABLET | Freq: Once | ORAL | Status: AC
  Administered 2022-11-04: 2 mg via ORAL
  Filled 2022-11-04: qty 1

## 2022-11-04 MED ORDER — CEPHALEXIN 500 MG PO CAPS: 500.0000 mg | ORAL_CAPSULE | Freq: Four times a day (QID) | ORAL | 0 refills | Status: AC

## 2022-11-04 MED ORDER — TAMSULOSIN HCL 0.4 MG PO CAPS
0.4000 mg | ORAL_CAPSULE | Freq: Every day | ORAL | 0 refills | Status: AC
Start: 2022-11-04 — End: 2022-11-18

## 2022-11-04 MED ORDER — CEPHALEXIN 500 MG PO CAPS
500.0000 mg | ORAL_CAPSULE | Freq: Once | ORAL | Status: AC
  Administered 2022-11-04: 500 mg via ORAL
  Filled 2022-11-04: qty 1

## 2022-11-04 NOTE — Progress Notes (Signed)
11/04/2022 1:09 PM   Paul Pruitt 1965-12-26 [id number]  CC: Chief Complaint  Patient presents with   Benign Prostatic Hypertrophy   HPI: Paul Pruitt is a 57 y.o. male with PMH BPH with urinary obstruction s/p UroLift with Dr. Apolinar Junes yesterday who presents today for follow-up of postoperative pain.  He presented to the emergency department overnight with continued urinary urgency/frequency, pelvic pain, and gross hematuria.  On arrival, PVR was .  He subsequently able to void 400 mL of urine and notes passage of some clots with this.  Subsequently, his voiding symptoms significantly improved.  Follow-up PVR in the ED was 0 mL.    Today he reports he continues to feel better than when I saw him yesterday, though he is still having some bothersome urgency/frequency.  He has not been taking Valium or oxycodone as I prescribed yesterday.  Notably, he took Azo yesterday for symptom relief, with little improvement.  He was prescribed Keflex in the ED when his UA was nitrite positive with mild pyuria, urine culture pending.  PMH: Past Medical History:  Diagnosis Date   Aortic root dilatation (HCC)    4.3 cm on echo in 2022   B12 deficiency    BPH with obstruction/lower urinary tract symptoms    Bradycardia    pt is a runner and very active with [employer]   Chronic left-sided lumbar radiculopathy    COVID-19    Dysplastic nevus 04/09/2015   left post lateral deltoid, mild atypia    Dysplastic nevus 05/17/2014   right prox med thigh, mild atypia    Dysplastic nevus 08/22/2008   right post shoulder, slight atypia    Dysplastic nevus 01/04/2007   R sup post lat thigh, slight atypia    Dysplastic nevus 09/15/2006   left post waistline paraspinal, severe atypia    Dysplastic nevus 07/09/2006   Left lower back, slight atypia    Dysplastic nevus 07/09/2006   right ear, moderate atypia    Dyspnea    Heart palpitations    Lymphadenopathy    Melanoma (HCC) 1995   L pectoral     PVC (premature ventricular contraction)    Scoliosis    Thrombocytopenia (HCC)    Vitamin D deficiency     Surgical History: Past Surgical History:  Procedure Laterality Date   BACK SURGERY N/A 2004, 2005   L3, 4 and L4,5   CATARACT EXTRACTION, BILATERAL     08/12/16 and 08/19/16.   COLONOSCOPY  02/07/2016   By dr Clovis Fredrickson polyps, internal hemorrhoids, waiting path report   CYSTOSCOPY WITH INSERTION OF UROLIFT N/A 11/03/2022   Procedure: CYSTOSCOPY WITH INSERTION OF UROLIFT;  Surgeon: Vanna Scotland, MD;  Location: [facility] ORS;  Service: Urology;  Laterality: N/A;   HERNIA REPAIR     LAMINECTOMY     x 2   MELANOMA EXCISION     chest area   TONSILLECTOMY      Home Medications:  Allergies as of 11/04/2022   No Known Allergies      Medication List        Accurate as of Nov 04, 2022  1:09 PM. If you have any questions, ask your nurse or doctor.          cephALEXin 500 MG capsule Commonly known as: KEFLEX Take 1 capsule (500 mg total) by mouth 4 (four) times daily for 7 days.   diazepam 5 MG tablet Commonly known as: Valium Place one tablet rectally every 6 hours as needed  for urgency/frequency.   ibuprofen 800 MG tablet Commonly known as: ADVIL Take 1 tablet (800 mg total) by mouth 2 (two) times daily as needed.   multivitamin tablet Take 1 tablet by mouth daily.   ondansetron 4 MG disintegrating tablet Commonly known as: ZOFRAN-ODT Take 1 tablet (4 mg total) by mouth every 8 (eight) hours as needed for nausea or vomiting.   oxyCODONE 5 MG immediate release tablet Commonly known as: Roxicodone Take 1-2 tablets (5-10 mg total) by mouth every 6 (six) hours as needed for severe pain.   tamsulosin 0.4 MG Caps capsule Commonly known as: FLOMAX Take 1 capsule (0.4 mg total) by mouth daily for 14 days. Started by: Carman Ching, PA-C        Allergies:  No Known Allergies  Family History: Family History  Problem Relation Age of Onset   Arthritis  Mother    Colon cancer Mother    Hypertension Father    Asthma Father    Hypertension Brother    Obesity Brother    Asthma Son    Obesity Son    Kidney disease Neg Hx    Prostate cancer Neg Hx     Social History:   reports that he has never smoked. He has never been exposed to tobacco smoke. He has never used smokeless tobacco. He reports that he does not currently use alcohol. He reports that he does not use drugs.  Physical Exam: BP 119/76   Pulse (!) 57   Ht 6\' 4"  (1.93 m)   Wt 198 lb (89.8 kg)   BMI 24.10 kg/m   Constitutional:  Alert and oriented, no acute distress, nontoxic appearing HEENT: [place], AT Cardiovascular: No clubbing, cyanosis, or edema Respiratory: Normal respiratory effort, no increased work of breathing Skin: No rashes, bruises or suspicious lesions Neurologic: Grossly intact, no focal deficits, moving all 4 extremities Psychiatric: Normal mood and affect  Laboratory Data: Results for orders placed or performed in visit on 11/04/22  Bladder Scan (Post Void Residual) in office  Result Value Ref Range   Scan Result 38    Assessment & Plan:   1. Benign prostatic hyperplasia with urinary obstruction Symptoms have improved after he was able to pass a clot in the ED spontaneously last night.  He is emptying appropriately today.  We reviewed postop expectations following UroLift including gross hematuria, dysuria, pelvic discomfort, and urgency/frequency.  I encouraged him to use the medications I prescribed as tools to get him through the postoperative period, okay to consider taking Valium p.o. rather than rectally.  I have very low suspicion for UTI at this point and suspect nitrites on UA overnight for a false positive in the setting of Azo use.  He has not started Keflex yet, will defer to him on whether or not to take this.  I also offered him a short course of tamsulosin to help with any obstructive symptoms he is having in the immediate postoperative period  and he accepted. - Bladder Scan (Post Void Residual) in office - tamsulosin (FLOMAX) 0.4 MG CAPS capsule; Take 1 capsule (0.4 mg total) by mouth daily for 14 days.  Dispense: 14 capsule; Refill: 0  Return if symptoms worsen or fail to improve.  Carman Ching, PA-C  [facility] [place] [address] [place], [place] [address] [phone]

## 2022-11-04 NOTE — Discharge Instructions (Addendum)
Please follow-up with urology tomorrow morning.  You received a dose of an antibiotic in the ER.  You can discuss whether to continue the antibiotic with your urologist.  Continue to take Tylenol and the oxycodone for pain.  If you are not able to urinate in the morning then please call the urology office so you can be seen sooner.

## 2022-11-04 NOTE — Progress Notes (Signed)
Patient complained of pain and blood clots in urine. Patient went to ER until 330am and got home after 4am. Patient states he has been to the bathroom 6-7 times since then and has a follow up appt at 1130am with Dr Francee Piccolo today.

## 2022-11-04 NOTE — ED Provider Notes (Signed)
[facility] Provider Note    Event Date/Time   First MD Initiated Contact with Patient 11/04/22 0041     (approximate)   History   Post-op Problem   HPI  Paul Pruitt is a 57 y.o. male   with past medical history of BPH who presents because of dysuria and inability to urinate.  Patient had a UroLift procedure done yesterday morning.  After procedure he was passing clots and having a lot of difficulty urinating this was seen in the office at 3:00.  He was prescribed Valium suppository and oxycodone.  At that time did not have significant amount of urine in the bladder.  He has follow-up around 11 tomorrow as well.  Since going home patient has had significant pain and urgency.  Has sensation that he needs to void but is not only able to void small amounts.  Urine is bloody and he is passing clots.  Tells me it is beet red.  He endorses some lower abdominal discomfort.  No flank pain or fevers.  No nausea vomiting.  He is taken Tylenol and some oxycodone for the pain with minimal relief.     Past Medical History:  Diagnosis Date   Aortic root dilatation (HCC)    4.3 cm on echo in 2022   B12 deficiency    BPH with obstruction/lower urinary tract symptoms    Bradycardia    pt is a runner and very active with [employer]   Chronic left-sided lumbar radiculopathy    COVID-19    Dysplastic nevus 04/09/2015   left post lateral deltoid, mild atypia    Dysplastic nevus 05/17/2014   right prox med thigh, mild atypia    Dysplastic nevus 08/22/2008   right post shoulder, slight atypia    Dysplastic nevus 01/04/2007   R sup post lat thigh, slight atypia    Dysplastic nevus 09/15/2006   left post waistline paraspinal, severe atypia    Dysplastic nevus 07/09/2006   Left lower back, slight atypia    Dysplastic nevus 07/09/2006   right ear, moderate atypia    Dyspnea    Heart palpitations    Lymphadenopathy    Melanoma (HCC) 1995   L pectoral    PVC (premature  ventricular contraction)    Scoliosis    Thrombocytopenia (HCC)    Vitamin D deficiency     Patient Active Problem List   Diagnosis Date Noted   Lymphadenopathy 08/21/2022   Back pain 08/15/2021   Aortic root dilatation (HCC) 08/15/2021   B12 deficiency 08/14/2021   Lumbar spondylosis 05/21/2021   Numbness in feet 02/11/2021   Urinary frequency 06/26/2020   SOB (shortness of breath) 06/26/2020   Heart palpitations 06/19/2020   Increased heart rate 02/10/2020   Contusion of left chest wall 11/30/2019   Thrombocytopenia (HCC) 08/11/2019   Healthcare maintenance 01/30/2019   Leg length inequality 08/13/2018   Scoliosis deformity of spine 08/13/2018   Fatigue fracture of vertebra 08/13/2018   Chronic left lumbar radiculopathy 01/06/2018   Headache 07/06/2017   History of cataract surgery 07/06/2017   Family history of colon cancer 07/06/2017   Degeneration of lumbosacral intervertebral disc 02/05/2016   Chronic low back pain 12/11/2015   BPH with obstruction/lower urinary tract symptoms 12/21/2014   Vitamin D deficiency 11/21/2014   Hx of colonic polyp 11/21/2014   Nocturia 11/21/2014   Encounter for screening for malignant neoplasm of prostate 10/17/2013     Physical Exam  Triage Vital Signs: ED  Triage Vitals  Enc Vitals Group     BP 11/03/22 2058 (!) 139/95     Pulse Rate 11/03/22 2058 (!) 51     Resp 11/03/22 2058 18     Temp 11/03/22 2058 97.9 F (36.6 C)     Temp Source 11/03/22 2058 Oral     SpO2 11/03/22 2058 98 %     Weight 11/03/22 2057 198 lb (89.8 kg)     Height 11/03/22 2057 6\' 4"  (1.93 m)     Head Circumference --      Peak Flow --      Pain Score --      Pain Loc --      Pain Edu? --      Excl. in GC? --     Most recent vital signs: Vitals:   11/03/22 2058  BP: (!) 139/95  Pulse: (!) 51  Resp: 18  Temp: 97.9 F (36.6 C)  SpO2: 98%     General: Awake, no distress. CV:  Good peripheral perfusion.  Resp:  Normal effort.  Abd:  No  distention.  Abdomen is soft nontender no palpable bladder Neuro:             Awake, Alert, Oriented x 3  Other:     ED Results / Procedures / Treatments  Labs (all labs ordered are listed, but only abnormal results are displayed) Labs Reviewed  COMPREHENSIVE METABOLIC PANEL - Abnormal; Notable for the following components:      Result Value   Glucose, Bld 107 (*)    BUN 23 (*)    AST 43 (*)    All other components within normal limits  URINALYSIS, ROUTINE W REFLEX MICROSCOPIC - Abnormal; Notable for the following components:   Color, Urine AMBER (*)    APPearance CLOUDY (*)    Hgb urine dipstick LARGE (*)    Protein, ur 100 (*)    Nitrite POSITIVE (*)    All other components within normal limits  URINE CULTURE  CBC WITH DIFFERENTIAL/PLATELET     EKG     RADIOLOGY    PROCEDURES:  Critical Care performed: No  Procedures   MEDICATIONS ORDERED IN ED: Medications  cephALEXin (KEFLEX) capsule 500 mg (500 mg Oral Given 11/04/22 0236)  diazepam (VALIUM) tablet 2 mg (2 mg Oral Given 11/04/22 0236)     IMPRESSION / MDM / ASSESSMENT AND PLAN / ED COURSE  I reviewed the triage vital signs and the nursing notes.                              Patient's presentation is most consistent with acute complicated illness / injury requiring diagnostic workup.  Differential diagnosis includes, but is not limited to, urinary retention, bladder spasm, significant clotted blood in the bladder,   The patient is a 57 year old male who is status post a UroLift procedure done yesterday morning presents with urgency hematuria and difficulty urinating.  Since the procedure patient has had urinary urgency frequency and feeling like he is not able to completely void.  It is also extremely painful needs at hematuria says he is passing clots.  Describes the urine as beet red.  Did see Dr. Delana Meyer PA Lelon Mast in the office yesterday after the procedure and was prescribed Valium suppository.   Unable to see note from that visit.  He presents to the ED tonight because of ongoing discomfort.  Tells me that in the waiting  room he must of gotten up to use the bathroom about 10-12 times with minimal urine output. When I see him he does look comfortable and abdominal exam is benign.  He is denying any fevers or flank pain.  Urinalysis which was sent from triage does show positive nitrites pyuria and hematuria.  No visualized bacteria.  Urine cultures been sent.  CBC and CMP largely within normal limits otherwise.  Bladder scan from triage showed about 230 cc in the bladder.  Was able to urinate almost 400 cc into urinal Urine is dark but not grossly bloody.  There is 1 small clot at the bottom of the urinal.  He does feel improved.  Postvoid bladder scan shows essentially 0 cc of urine in the bladder.  At this point I am reassured that patient has made a normal amount of urine and is not in retention.  I am also not concerned about significant clot burden given how his urine seems to have cleared.  Given nitrite positive will cover with Keflex and have sent a culture although given there is no bacteria on urinalysis infection is less likely.  I am reassured that patient has follow-up tomorrow morning with urology and can be reassessed at this point.        FINAL CLINICAL IMPRESSION(S) / ED DIAGNOSES   Final diagnoses:  Dysuria  Hematuria, unspecified type     Rx / DC Orders   ED Discharge Orders     None        Note:  This document was prepared using Dragon voice recognition software and may include unintentional dictation errors.   Georga Hacking, MD 11/04/22 [phone]

## 2022-11-05 ENCOUNTER — Encounter: Payer: Self-pay | Admitting: Internal Medicine

## 2022-11-05 LAB — URINE CULTURE: Culture: NO GROWTH

## 2022-11-05 NOTE — Progress Notes (Signed)
11/03/2022 11:30 AM   Paul Pruitt April 27, 1966 [id number]  CC: Chief Complaint  Patient presents with   Urinary Retention   HPI: Paul Pruitt is a 56 y.o. male with PMH BPH with urinary obstruction s/p UroLift with Dr. Apolinar Junes earlier today who presents today for evaluation of possible urinary retention. He is accompanied today by his wife, who contributes to HPI.  Today he reports severe urgency/frequency, dysuria, difficulty urinating, and gross hematuria since being discharged earlier this morning. He thinks there has been some clot material in his urine. He has not taken any additional medications since being discharged; they try to avoid controlled substances.  PVR 34mL.  PMH: Past Medical History:  Diagnosis Date   Aortic root dilatation (HCC)    4.3 cm on echo in 2022   B12 deficiency    BPH with obstruction/lower urinary tract symptoms    Bradycardia    pt is a runner and very active with [employer]   Chronic left-sided lumbar radiculopathy    COVID-19    Dysplastic nevus 04/09/2015   left post lateral deltoid, mild atypia    Dysplastic nevus 05/17/2014   right prox med thigh, mild atypia    Dysplastic nevus 08/22/2008   right post shoulder, slight atypia    Dysplastic nevus 01/04/2007   R sup post lat thigh, slight atypia    Dysplastic nevus 09/15/2006   left post waistline paraspinal, severe atypia    Dysplastic nevus 07/09/2006   Left lower back, slight atypia    Dysplastic nevus 07/09/2006   right ear, moderate atypia    Dyspnea    Heart palpitations    Lymphadenopathy    Melanoma (HCC) 1995   L pectoral    PVC (premature ventricular contraction)    Scoliosis    Thrombocytopenia (HCC)    Vitamin D deficiency     Surgical History: Past Surgical History:  Procedure Laterality Date   BACK SURGERY N/A 2004, 2005   L3, 4 and L4,5   CATARACT EXTRACTION, BILATERAL     08/12/16 and 08/19/16.   COLONOSCOPY  02/07/2016   By dr Clovis Fredrickson polyps, internal  hemorrhoids, waiting path report   CYSTOSCOPY WITH INSERTION OF UROLIFT N/A 11/03/2022   Procedure: CYSTOSCOPY WITH INSERTION OF UROLIFT;  Surgeon: Vanna Scotland, MD;  Location: [facility] ORS;  Service: Urology;  Laterality: N/A;   HERNIA REPAIR     LAMINECTOMY     x 2   MELANOMA EXCISION     chest area   TONSILLECTOMY      Home Medications:  Allergies as of 11/03/2022   No Known Allergies      Medication List        Accurate as of Nov 03, 2022 11:59 PM. If you have any questions, ask your nurse or doctor.          cephALEXin 500 MG capsule Commonly known as: KEFLEX Take 1 capsule (500 mg total) by mouth 4 (four) times daily for 7 days.   diazepam 5 MG tablet Commonly known as: Valium Place one tablet rectally every 6 hours as needed for urgency/frequency.   ibuprofen 800 MG tablet Commonly known as: ADVIL Take 1 tablet (800 mg total) by mouth 2 (two) times daily as needed.   multivitamin tablet Take 1 tablet by mouth daily.   ondansetron 4 MG disintegrating tablet Commonly known as: ZOFRAN-ODT Take 1 tablet (4 mg total) by mouth every 8 (eight) hours as needed for nausea or vomiting.   oxyCODONE  5 MG immediate release tablet Commonly known as: Roxicodone Take 1-2 tablets (5-10 mg total) by mouth every 6 (six) hours as needed for severe pain.        Allergies:  No Known Allergies  Family History: Family History  Problem Relation Age of Onset   Arthritis Mother    Colon cancer Mother    Hypertension Father    Asthma Father    Hypertension Brother    Obesity Brother    Asthma Son    Obesity Son    Kidney disease Neg Hx    Prostate cancer Neg Hx     Social History:   reports that he has never smoked. He has never been exposed to tobacco smoke. He has never used smokeless tobacco. He reports that he does not currently use alcohol. He reports that he does not use drugs.  Physical Exam: BP 122/76   Pulse (!) 49   Ht 6\' 4"  (1.93 m)   Wt 198 lb (89.8  kg)   BMI 24.10 kg/m   Constitutional:  Alert and oriented, no acute distress, nontoxic appearing HEENT: [place], AT Cardiovascular: No clubbing, cyanosis, or edema Respiratory: Normal respiratory effort, no increased work of breathing Skin: No rashes, bruises or suspicious lesions Neurologic: Grossly intact, no focal deficits, moving all 4 extremities Psychiatric: Normal mood and affect  Laboratory Data: Results for orders placed or performed in visit on 11/03/22  Bladder Scan (Post Void Residual) in office  Result Value Ref Range   Scan Result 34 ml    Assessment & Plan:   1. Benign prostatic hyperplasia with urinary obstruction Afebrile, VSS, bladder scan WNL. Low concern for clot retention. We discussed that I think he is experiencing normal post-UroLift symptoms, which include dysuria, pelvic pain, urgency/leakage, and gross hematuria, but that these typically resolve within 2-4 weeks.  I did not elect to place Foley today or irrigate his bladder given normal residual and concern that further instrumentation would increase risk for infection and potentially worsen bladder spasms.  Will attempt to manage symptoms with pharmacotherapy alone at this point and see him back in clinic tomorrow for symptom recheck and repeat PVR. We discussed starting oxycodone, to be supplemented at home with Tylenol as desired, and rectal valium for bladder spasms. He has had nausea with oxycodone previously, so I'm sending in Zofran as well. We discussed that these are tools we are using in the immediate postoperative period to manage his surgical pain, but that I do not anticipate these will be needed on a long-term basis. - Bladder Scan (Post Void Residual) in office - diazepam (VALIUM) 5 MG tablet; Place one tablet rectally every 6 hours as needed for urgency/frequency.  Dispense: 10 tablet; Refill: 0 - ondansetron (ZOFRAN-ODT) 4 MG disintegrating tablet; Take 1 tablet (4 mg total) by mouth every 8 (eight)  hours as needed for nausea or vomiting.  Dispense: 20 tablet; Refill: 0 - oxyCODONE (ROXICODONE) 5 MG immediate release tablet; Take 1-2 tablets (5-10 mg total) by mouth every 6 (six) hours as needed for severe pain.  Dispense: 10 tablet; Refill: 0  Return in 1 day (on 11/04/2022) for Symptom recheck with PVR.  Carman Ching, PA-C  [facility] [place] [address] [place], [place] [address] [phone]

## 2022-11-06 NOTE — Telephone Encounter (Signed)
In reviewing the lab - the urine culture from 11/03/22 was negative.  (So no infection at the time the urine was collected).  I reviewed his visit to urology, and they had commented - symptoms were doing better.  If he feels symptoms are worsening, changed, persistent - I would recommend contacting urology and let them know with question of need for reevaluation or recheck urine, etc.

## 2022-11-08 ENCOUNTER — Other Ambulatory Visit: Payer: Self-pay | Admitting: Internal Medicine

## 2022-11-24 DIAGNOSIS — R0602 Shortness of breath: Secondary | ICD-10-CM | POA: Diagnosis not present

## 2022-11-24 DIAGNOSIS — R002 Palpitations: Secondary | ICD-10-CM | POA: Diagnosis not present

## 2022-11-24 DIAGNOSIS — I77810 Thoracic aortic ectasia: Secondary | ICD-10-CM | POA: Diagnosis not present

## 2022-12-01 ENCOUNTER — Other Ambulatory Visit: Payer: Self-pay | Admitting: Physician Assistant

## 2022-12-01 ENCOUNTER — Other Ambulatory Visit: Payer: Self-pay | Admitting: General Surgery

## 2022-12-01 DIAGNOSIS — R590 Localized enlarged lymph nodes: Secondary | ICD-10-CM

## 2022-12-01 DIAGNOSIS — I7121 Aneurysm of the ascending aorta, without rupture: Secondary | ICD-10-CM

## 2022-12-01 DIAGNOSIS — I77810 Thoracic aortic ectasia: Secondary | ICD-10-CM

## 2022-12-08 ENCOUNTER — Encounter: Payer: BC Managed Care – PPO | Admitting: Internal Medicine

## 2022-12-17 ENCOUNTER — Ambulatory Visit: Payer: BC Managed Care – PPO | Admitting: Urology

## 2022-12-17 ENCOUNTER — Ambulatory Visit
Admission: RE | Admit: 2022-12-17 | Discharge: 2022-12-17 | Disposition: A | Discharge status: Home / Self Care | Payer: BC Managed Care – PPO | Source: Ambulatory Visit | Attending: Physician Assistant | Admitting: Physician Assistant

## 2022-12-17 VITALS — BP 110/73 | HR 75 | Ht 76.0 in | Wt 198.0 lb

## 2022-12-17 DIAGNOSIS — N401 Enlarged prostate with lower urinary tract symptoms: Secondary | ICD-10-CM | POA: Diagnosis not present

## 2022-12-17 DIAGNOSIS — R350 Frequency of micturition: Secondary | ICD-10-CM

## 2022-12-17 DIAGNOSIS — N138 Other obstructive and reflux uropathy: Secondary | ICD-10-CM

## 2022-12-17 DIAGNOSIS — I77810 Thoracic aortic ectasia: Secondary | ICD-10-CM | POA: Diagnosis not present

## 2022-12-17 DIAGNOSIS — I7121 Aneurysm of the ascending aorta, without rupture: Secondary | ICD-10-CM | POA: Diagnosis not present

## 2022-12-17 DIAGNOSIS — I719 Aortic aneurysm of unspecified site, without rupture: Secondary | ICD-10-CM | POA: Diagnosis not present

## 2022-12-17 LAB — BLADDER SCAN AMB NON-IMAGING: Scan Result: 12

## 2022-12-17 MED ORDER — IOHEXOL 350 MG/ML SOLN
75.0000 mL | Freq: Once | INTRAVENOUS | Status: AC | PRN
  Administered 2022-12-17: 75 mL via INTRAVENOUS

## 2022-12-17 NOTE — Progress Notes (Signed)
Marcelle Overlie Plume,acting as a scribe for Vanna Scotland, MD.,have documented all relevant documentation on the behalf of Vanna Scotland, MD,as directed by  Vanna Scotland, MD while in the presence of Vanna Scotland, MD.  12/17/2022 9:14 AM   [name] 29-Apr-1966 [id number]  Referring provider: Dale Charenton, MD [address] [place],  [place] [address]  Chief Complaint  Patient presents with   Follow-up   Benign Prostatic Hypertrophy    HPI: 57 year-old male with a personal history of BPH, status post Uro lift on 11/03/2022 who returns today for follow up.   His post-operative course was complicated by difficulty urinating in an ER visit. He was able to pass a clot and ultimately empty. He was seen in the office the following day by pur PA and emptying reasonably well. UCx negative.    Today, he reports persistent irritative urinary symptoms including frequency, weak stream, and nocturia x5. He reports feeling exhausted due the the frequent nocturnal awakenings. He denies any burning sensations or pelvic pain.  He describes the post-operative experience as extremely distressing, comparable to significant life events such as back surgeries and the loss of both parents.  Results for orders placed or performed in visit on 12/17/22  BLADDER SCAN AMB NON-IMAGING  Result Value Ref Range   Scan Result 12 ml     IPSS     Row Name 12/17/22 0800         International Prostate Symptom Score   How often have you had the sensation of not emptying your bladder? Less than half the time     How often have you had to urinate less than every two hours? Almost always     How often have you found you stopped and started again several times when you urinated? More than half the time     How often have you found it difficult to postpone urination? About half the time     How often have you had a weak urinary stream? Almost always     How often have you had to strain to  start urination? Not at All     How many times did you typically get up at night to urinate? 5 Times     Total IPSS Score 24       Quality of Life due to urinary symptoms   If you were to spend the rest of your life with your urinary condition just the way it is now how would you feel about that? Mixed              Score:  1-7 Mild 8-19 Moderate 20-35 Severe   PMH: Past Medical History:  Diagnosis Date   Aortic root dilatation (HCC)    4.3 cm on echo in 2022   B12 deficiency    BPH with obstruction/lower urinary tract symptoms    Bradycardia    pt is a runner and very active with [employer]   Chronic left-sided lumbar radiculopathy    COVID-19    Dysplastic nevus 04/09/2015   left post lateral deltoid, mild atypia    Dysplastic nevus 05/17/2014   right prox med thigh, mild atypia    Dysplastic nevus 08/22/2008   right post shoulder, slight atypia    Dysplastic nevus 01/04/2007   R sup post lat thigh, slight atypia    Dysplastic nevus 09/15/2006   left post waistline paraspinal, severe atypia    Dysplastic nevus 07/09/2006   Left lower  back, slight atypia    Dysplastic nevus 07/09/2006   right ear, moderate atypia    Dyspnea    Heart palpitations    Lymphadenopathy    Melanoma (HCC) 1995   L pectoral    PVC (premature ventricular contraction)    Scoliosis    Thrombocytopenia (HCC)    Vitamin D deficiency     Surgical History: Past Surgical History:  Procedure Laterality Date   BACK SURGERY N/A 2004, 2005   L3, 4 and L4,5   CATARACT EXTRACTION, BILATERAL     08/12/16 and 08/19/16.   COLONOSCOPY  02/07/2016   By dr Clovis Fredrickson polyps, internal hemorrhoids, waiting path report   CYSTOSCOPY WITH INSERTION OF UROLIFT N/A 11/03/2022   Procedure: CYSTOSCOPY WITH INSERTION OF UROLIFT;  Surgeon: Vanna Scotland, MD;  Location: [facility] ORS;  Service: Urology;  Laterality: N/A;   HERNIA REPAIR     LAMINECTOMY     x 2   MELANOMA EXCISION     chest area   TONSILLECTOMY       Home Medications:  Allergies as of 12/17/2022   No Known Allergies      Medication List        Accurate as of December 17, 2022  9:14 AM. If you have any questions, ask your nurse or doctor.          diazepam 5 MG tablet Commonly known as: Valium Place one tablet rectally every 6 hours as needed for urgency/frequency.   ibuprofen 800 MG tablet Commonly known as: ADVIL TAKE 1 TABLET BY MOUTH 2 TIMES DAILY AS NEEDED.   multivitamin tablet Take 1 tablet by mouth daily.   ondansetron 4 MG disintegrating tablet Commonly known as: ZOFRAN-ODT Take 1 tablet (4 mg total) by mouth every 8 (eight) hours as needed for nausea or vomiting.   oxyCODONE 5 MG immediate release tablet Commonly known as: Roxicodone Take 1-2 tablets (5-10 mg total) by mouth every 6 (six) hours as needed for severe pain.        Family History: Family History  Problem Relation Age of Onset   Arthritis Mother    Colon cancer Mother    Hypertension Father    Asthma Father    Hypertension Brother    Obesity Brother    Asthma Son    Obesity Son    Kidney disease Neg Hx    Prostate cancer Neg Hx     Social History:  reports that he has never smoked. He has never been exposed to tobacco smoke. He has never used smokeless tobacco. He reports that he does not currently use alcohol. He reports that he does not use drugs.   Physical Exam: BP 110/73   Pulse 75   Ht 6\' 4"  (1.93 m)   Wt 198 lb (89.8 kg)   BMI 24.10 kg/m   Constitutional:  Alert and oriented, No acute distress. HEENT: [place] AT, moist mucus membranes.  Trachea midline, no masses. Neurologic: Grossly intact, no focal deficits, moving all 4 extremities. Psychiatric: Normal mood and affect.   Assessment & Plan:    1. BPH with urinary frequency - Status post Uro lift - Continues to have weak stream and urgency  - He is emptying his bladder well - He does not think that the surgery worked - We will plan to treat his urinary frequency  with a month of gemtesa x 4 weeks to try and calm his bladder, especially in the perioperative period. We will have him stop in a  month and reassess his symptoms in 6 weeks and discuss additional options based on his symptoms at that time. We had a lengthy discussion about the mechanism of the procedure along with efficacy rates.   I have reviewed the above documentation for accuracy and completeness, and I agree with the above.   Vanna Scotland, MD    Return in about 6 weeks (around 01/28/2023) for reassessment of symptoms.   [facility] [address] [place], [place] [address] [phone]

## 2023-01-02 ENCOUNTER — Ambulatory Visit: Payer: BC Managed Care – PPO

## 2023-01-14 ENCOUNTER — Ambulatory Visit: Payer: BC Managed Care – PPO | Admitting: Urology

## 2023-01-14 VITALS — BP 115/66 | HR 79 | Ht 76.0 in | Wt 198.4 lb

## 2023-01-14 DIAGNOSIS — R3912 Poor urinary stream: Secondary | ICD-10-CM

## 2023-01-14 DIAGNOSIS — N401 Enlarged prostate with lower urinary tract symptoms: Secondary | ICD-10-CM

## 2023-01-14 DIAGNOSIS — R82998 Other abnormal findings in urine: Secondary | ICD-10-CM

## 2023-01-14 DIAGNOSIS — N138 Other obstructive and reflux uropathy: Secondary | ICD-10-CM

## 2023-01-14 LAB — BLADDER SCAN AMB NON-IMAGING: Scan Result: 0

## 2023-01-14 NOTE — Progress Notes (Signed)
Marcelle Overlie Plume,acting as a scribe for Vanna Scotland, MD.,have documented all relevant documentation on the behalf of Vanna Scotland, MD,as directed by  Vanna Scotland, MD while in the presence of Vanna Scotland, MD.  01/14/2023 9:33 AM   Paul Pruitt 04-Aug-1965 [id number]  Referring provider: Dale Guttenberg, MD [address] [place],  [place] [address]  Chief Complaint Patient presents with  Benign Prostatic Hypertrophy   HPI: 57 year- old male with a personal history of BPH who returns today for a 4 week follow up.  Today, he continues to experience primarily intermittency and weak stream. We had given him a trial of gemtesa but it does not appear that things have improved. However, he did not take the medication consistently, missing five days early in the treatment course while [place]. He took the medication for approximately 20-21 days out of the prescribed 25 days. He reports no significant improvement in his symptoms. He describes his urinary symptoms as inconsistent, with periods of frequent urination and weak stream, particularly at night. He mentions that his wife believes there might be slight improvement, but he does not feel the same. He is considering living with the symptoms if they do not worsen but is open to further evaluation if necessary.    Results for orders placed or performed in visit on 01/14/23 Bladder Scan (Post Void Residual) in office Result Value Ref Range Scan Result 0 ml  IPSS  Row Name 01/14/23 0800 International Prostate Symptom Score How often have you had the sensation of not emptying your bladder? About half the time How often have you had to urinate less than every two hours? More than half the time How often have you found you stopped and started again several times when you urinated? Almost always How often have you found it difficult to postpone urination? Less than 1 in 5 times How often have you  had a weak urinary stream? Almost always How often have you had to strain to start urination? Less than 1 in 5 times How many times did you typically get up at night to urinate? 3 Times Total IPSS Score 22 Quality of Life due to urinary symptoms If you were to spend the rest of your life with your urinary condition just the way it is now how would you feel about that? Mixed    Score:  1-7 Mild 8-19 Moderate 20-35 Severe     PMH: Past Medical History: Diagnosis Date  Aortic root dilatation (HCC) 4.3 cm on echo in 2022  B12 deficiency  BPH with obstruction/lower urinary tract symptoms  Bradycardia pt is a runner and very active with [employer]  Chronic left-sided lumbar radiculopathy  COVID-19  Dysplastic nevus 04/09/2015 left post lateral deltoid, mild atypia   Dysplastic nevus 05/17/2014 right prox med thigh, mild atypia   Dysplastic nevus 08/22/2008 right post shoulder, slight atypia   Dysplastic nevus 01/04/2007 R sup post lat thigh, slight atypia   Dysplastic nevus 09/15/2006 left post waistline paraspinal, severe atypia   Dysplastic nevus 07/09/2006 Left lower back, slight atypia   Dysplastic nevus 07/09/2006 right ear, moderate atypia   Dyspnea  Heart palpitations  Lymphadenopathy  Melanoma (HCC) 1995 L pectoral   PVC (premature ventricular contraction)  Scoliosis  Thrombocytopenia (HCC)  Vitamin D deficiency   Surgical History: Past Surgical History: Procedure Laterality Date  BACK SURGERY N/A 2004, 2005 L3, 4 and L4,5  CATARACT EXTRACTION, BILATERAL 08/12/16 and 08/19/16.  COLONOSCOPY 02/07/2016 By dr Clovis Fredrickson  polyps, internal hemorrhoids, waiting path report  CYSTOSCOPY WITH INSERTION OF UROLIFT N/A 11/03/2022 Procedure: CYSTOSCOPY WITH INSERTION OF UROLIFT;  Surgeon: Vanna Scotland, MD;  Location: [facility] OR[facility];  Service: Urology;  Laterality: N/A;  HERNIA REPAIR  LAMINECTOMY x 2  MELANOMA  EXCISION chest area  TONSILLECTOMY   Home Medications:  Allergies as of 01/14/2023  No Known Allergies  Medication List   Accurate as of January 14, 2023  9:33 AM. If you have any questions, ask your nurse or doctor.   STOP taking these medications   diazepam 5 MG tablet Commonly known as: Valium ondansetron 4 MG disintegrating tablet Commonly known as: ZOFRAN-ODT oxyCODONE 5 MG immediate release tablet Commonly known as: Roxicodone   TAKE these medications   ibuprofen 800 MG tablet Commonly known as: ADVIL TAKE 1 TABLET BY MOUTH 2 TIMES DAILY AS NEEDED. multivitamin tablet Take 1 tablet by mouth daily.    Family History: Family History Problem Relation Age of Onset  Arthritis Mother  Colon cancer Mother  Hypertension Father  Asthma Father  Hypertension Brother  Obesity Brother  Asthma Son  Obesity Son  Kidney disease Neg Hx  Prostate cancer Neg Hx   Social History:  reports that he has never smoked. He has never been exposed to tobacco smoke. He has never used smokeless tobacco. He reports that he does not currently use alcohol. He reports that he does not use drugs.   Physical Exam: BP 115/66   Pulse 79   Ht 6\' 4"  (1.93 m)   Wt 198 lb 6 oz (90 kg)   BMI 24.15 kg/m   Constitutional:  Alert and oriented, No acute distress. HEENT: [place] AT, moist mucus membranes.  Trachea midline, no masses. Neurologic: Grossly intact, no focal deficits, moving all 4 extremities. Psychiatric: Normal mood and affect.   Assessment & Plan:    1. BPH with persistent weak stream and intermittency - He tried gemtesa 75 mg but not consistently. He has had no change in his urinary symptoms, although subjectively, his wife thinks maybe things are slightly improved.  - He is not having any other signs or symptoms of infection. - He is emptying his bladder well - We discussed options including reevaluation with cystoscopy, urodynamics, resuming medications  versus alternative diagnosis such as pelvic floor dysfunction which certainly could be a contributing factor in the situation. - We discussed the option of a physical therapy referral and I did give him information to educate himself about pelvic floor dysfunction at this point. - He does not want to pursue any further intervention - He will let us know if he decides differently or would like a PT referral.    Return if symptoms worsen or fail to improve.  I have reviewed the above documentation for accuracy and completeness, and I agree with the above.   Vanna Scotland, MD    [facility] [address] [place], [place] [address] [phone]

## 2023-01-14 NOTE — Patient Instructions (Signed)
Pelvic Floor Dysfunction, Male     Pelvic floor dysfunction (PFD) is a condition that results when the group of muscles and connective tissues that support the organs in the pelvis (pelvic floor muscles) do not work well. These muscles and their connections form a sling that supports the colon and bladder. In men, these muscles also support the prostate gland. PFD causes pelvic floor muscles to be too weak, too tight, or both. In PFD, muscle movements are not coordinated. This may cause bowel or bladder problems. It may also cause pain. What are the causes? This condition may be caused by an injury to the pelvic area or by a weakening of pelvic muscles. In many cases, the exact cause is not known. What increases the risk? The following factors may make you more likely to develop PFD: Having chronic bladder tissue inflammation (interstitial cystitis). Being an older person. Being overweight. History of radiation treatment for cancer in the pelvic region. Previous pelvic surgery, such as removal of the prostate gland (prostatectomy). What are the signs or symptoms? Symptoms of this condition vary and may include: Bladder symptoms, such as: Trouble starting urination and emptying the bladder. Frequent urinary tract infections. Leaking urine when coughing, laughing, or exercising (stress incontinence). Having to pass urine urgently or frequently. Pain when passing urine. Bowel symptoms, such as: Constipation. Urgent or frequent bowel movements. Incomplete bowel movements. Painful bowel movements. Leaking stool or gas. Unexplained genital or rectal pain. Genital or rectal muscle spasms. Low back pain. Sexual dysfunction, such as erectile dysfunction, premature ejaculation, or pain during or after sexual activity. How is this diagnosed? This condition is diagnosed based on: Your symptoms and medical history. A physical exam. During the exam, your health care provider may check your  pelvic muscles for tightness, spasm, pain, or weakness. This may include a rectal exam. In some cases, you may have diagnostic tests, such as: Electrical muscle function tests. Urine flow testing. X-ray tests of bowel function. Ultrasound of the pelvic organs. How is this treated? Treatment for this condition depends on your symptoms. Treatment options include: Physical therapy. This may include Kegel exercises to help relax or strengthen the pelvic floor muscles. Biofeedback. This type of therapy provides feedback on how tight your pelvic floor muscles are so that you can learn to control them. Massage therapy. A treatment that involves electrical stimulation of the pelvic floor muscles to help control pain (transcutaneous electrical nerve stimulation, or TENS). Sound wave therapy (ultrasound) to reduce muscle spasms. Medicines, such as: Muscle relaxants. Bladder control medicines. Surgery to reconstruct or support pelvic floor muscles may be an option if other treatments do not help. Follow these instructions at home: Activity Do your usual activities as told by your health care provider. Ask your health care provider if you should modify any activities. Do pelvic floor strengthening or relaxing exercises at home as told by your physical therapist. Lifestyle Maintain a healthy weight. Eat foods that are high in fiber, such as beans, whole grains, and fresh fruits and vegetables. Limit foods that are high in fat and processed sugars, such as fried or sweet foods. Manage stress with relaxation techniques such as yoga or meditation. General instructions If you have problems with leakage: Use absorbable pads or wear padded underwear. Wash your genital and anal area frequently with mild soap. Keep your genital and anal area as clean and dry as possible. Ask your health care provider if you should try a barrier cream to prevent skin irritation. Take warm  baths to relieve pelvic muscle  tension or spasms. Take over-the-counter and prescription medicines only as told by your health care provider. Keep all follow-up visits. How is this prevented? The cause of PFD is not always known, but there are a few things you can do to reduce the risk of developing this condition, including: Staying at a healthy weight. Getting regular exercise. Managing stress. Contact a health care provider if: Your symptoms are not improving with home care. You have signs or symptoms of PFD that get worse. You develop new signs or symptoms. You have signs of a urinary tract infection, such as: Fever. Chills. Increased urinary frequency. A burning feeling when urinating. You have not had a bowel movement in 3 days (constipation). Summary Pelvic floor dysfunction results when the muscles and connective tissues in your pelvic floor do not work well. These muscles and their connections form a sling that supports your colon and bladder. In men, these muscles also support the prostate gland. PFD may be caused by an injury to the pelvic area or by a weakening of pelvic muscles. PFD causes pelvic floor muscles to be too weak, too tight, or a combination of both. Symptoms may vary from person to person. In most cases, PFD can be treated with physical therapies and medicines. Surgery may be an option if other treatments do not help. This information is not intended to replace advice given to you by your health care provider. Make sure you discuss any questions you have with your health care provider. Document Revised: 10/10/2020 Document Reviewed: 10/10/2020 Elsevier Patient Education  2024 [employer].

## 2023-01-22 ENCOUNTER — Encounter: Payer: Self-pay | Admitting: Internal Medicine

## 2023-01-22 ENCOUNTER — Ambulatory Visit (INDEPENDENT_AMBULATORY_CARE_PROVIDER_SITE_OTHER): Payer: BC Managed Care – PPO | Admitting: Internal Medicine

## 2023-01-22 VITALS — BP 118/70 | HR 66 | Temp 97.9°F | Resp 16 | Ht 76.0 in | Wt 198.4 lb

## 2023-01-22 DIAGNOSIS — N401 Enlarged prostate with lower urinary tract symptoms: Secondary | ICD-10-CM | POA: Diagnosis not present

## 2023-01-22 DIAGNOSIS — Z86010 Personal history of colonic polyps: Secondary | ICD-10-CM

## 2023-01-22 DIAGNOSIS — Z0000 Encounter for general adult medical examination without abnormal findings: Secondary | ICD-10-CM

## 2023-01-22 DIAGNOSIS — R42 Dizziness and giddiness: Secondary | ICD-10-CM

## 2023-01-22 DIAGNOSIS — I77810 Thoracic aortic ectasia: Secondary | ICD-10-CM | POA: Diagnosis not present

## 2023-01-22 DIAGNOSIS — E559 Vitamin D deficiency, unspecified: Secondary | ICD-10-CM

## 2023-01-22 DIAGNOSIS — D696 Thrombocytopenia, unspecified: Secondary | ICD-10-CM

## 2023-01-22 DIAGNOSIS — R0602 Shortness of breath: Secondary | ICD-10-CM

## 2023-01-22 DIAGNOSIS — N138 Other obstructive and reflux uropathy: Secondary | ICD-10-CM

## 2023-01-22 NOTE — Progress Notes (Signed)
Subjective:    Patient ID: Paul Pruitt, male    DOB: [birth year], 57 y.o.   MRN: [id number]  Patient here for  Chief Complaint  Patient presents with   Annual Exam    HPI Here for a physical exam.  Has been seeing urology.  S/p cystoscopy TRUS 11/2021. Persistent symptoms.  Reevaluated and recommendation for urolift. S/p urolift 11/03/22.  Persistent symptoms with intermittency and weak stream.  Trial of gemtesa - did not help. Discussed further evaluation and PT.  Elected to monitor.  Was evaluated by cardiology 11/24/22. Reported intermittent episodes of more sob - noticed more lying down - heart pounding. He is very active.  Exercises regularly.  Has not symptoms when exercising. Feels better when exercising.  The patient underwent ETT on 07/26/2020, exercised 14 minutes on a Bruce protocol without chest pain or ischemic ECG changes. 2D echocardiogram was performed 07/26/2020 which revealed normal left ventricular function, with LVEF greater than 55%, with trivial valvular insufficiencies, and mildly dilated aortic root of 4.3 cm. Recommended CT scan to evaluate aortic root. F/u CT scan 12/17/22 - 5.2 cm aortic sinus aneurysm. Remainder of thoracic aorta normal in caliber.  Referred to Dr Kizzie Bane.  Scheduled f/u ECHO and appt with Dr Kizzie Bane 03/05/23.  Reports symptoms are relatively stable.  Has noticed when bends over and comes back up, occasional light headedness.  No persistent dizziness or light headedness.  No abdominal pain or bowel change.    Past Medical History:  Diagnosis Date   Aortic root dilatation (HCC)    4.3 cm on echo in 2022   B12 deficiency    BPH with obstruction/lower urinary tract symptoms    Bradycardia    pt is a runner and very active with [employer]   Chronic left-sided lumbar radiculopathy    COVID-19    Dysplastic nevus 04/09/2015   left post lateral deltoid, mild atypia    Dysplastic nevus 05/17/2014   right prox med thigh, mild atypia    Dysplastic nevus  08/22/2008   right post shoulder, slight atypia    Dysplastic nevus 01/04/2007   R sup post lat thigh, slight atypia    Dysplastic nevus 09/15/2006   left post waistline paraspinal, severe atypia    Dysplastic nevus 07/09/2006   Left lower back, slight atypia    Dysplastic nevus 07/09/2006   right ear, moderate atypia    Dyspnea    Heart palpitations    Lymphadenopathy    Melanoma (HCC) 1995   L pectoral    PVC (premature ventricular contraction)    Scoliosis    Thrombocytopenia (HCC)    Vitamin D deficiency    Past Surgical History:  Procedure Laterality Date   BACK SURGERY N/A 2004, 2005   L3, 4 and L4,5   CATARACT EXTRACTION, BILATERAL     08/12/16 and 08/19/16.   COLONOSCOPY  02/07/2016   By dr Clovis Fredrickson polyps, internal hemorrhoids, waiting path report   CYSTOSCOPY WITH INSERTION OF UROLIFT N/A 11/03/2022   Procedure: CYSTOSCOPY WITH INSERTION OF UROLIFT;  Surgeon: Vanna Scotland, MD;  Location: [facility];  Service: Urology;  Laterality: N/A;   HERNIA REPAIR     LAMINECTOMY     x 2   MELANOMA EXCISION     chest area   TONSILLECTOMY     Family History  Problem Relation Age of Onset   Arthritis Mother    Colon cancer Mother    Hypertension Father    Asthma Father  Hypertension Brother    Obesity Brother    Asthma Son    Obesity Son    Kidney disease Neg Hx    Prostate cancer Neg Hx    Social History   Socioeconomic History   Marital status: Married    Spouse name: Not on file   Number of children: Not on file   Years of education: Not on file   Highest education level: Not on file  Occupational History   Not on file  Tobacco Use   Smoking status: Never    Passive exposure: Never   Smokeless tobacco: Never  Vaping Use   Vaping status: Never Used  Substance and Sexual Activity   Alcohol use: Not Currently    Comment: seldom-8 drinks a year maybe   Drug use: No   Sexual activity: Yes    Birth control/protection: None  Other Topics Concern   Not  on file  Social History Narrative   Patient states heavy exercise. 7 days per week x 30-90 minutes.   Social Determinants of Health   Financial Resource Strain: Not on file  Food Insecurity: Not on file  Transportation Needs: Not on file  Physical Activity: Not on file  Stress: Not on file  Social Connections: Not on file     Review of Systems  Constitutional:  Negative for appetite change and unexpected weight change.  HENT:  Negative for congestion, sinus pressure and sore throat.   Eyes:  Negative for pain and visual disturbance.  Respiratory:  Positive for shortness of breath. Negative for cough and chest tightness.   Cardiovascular:  Negative for chest pain, palpitations and leg swelling.  Gastrointestinal:  Negative for abdominal pain, diarrhea, nausea and vomiting.  Genitourinary:  Positive for frequency. Negative for dysuria.  Musculoskeletal:  Negative for joint swelling and myalgias.  Skin:  Negative for color change and rash.  Neurological:  Positive for light-headedness. Negative for headaches.  Hematological:  Negative for adenopathy. Does not bruise/bleed easily.  Psychiatric/Behavioral:  Negative for decreased concentration and dysphoric mood.        Objective:     BP 118/70   Pulse 66   Temp 97.9 F (36.6 C)   Resp 16   Ht 6\' 4"  (1.93 m)   Wt 198 lb 6.4 oz (90 kg)   SpO2 99%   BMI 24.15 kg/m  Wt Readings from Last 3 Encounters:  01/22/23 198 lb 6.4 oz (90 kg)  01/14/23 198 lb 6 oz (90 kg)  12/17/22 198 lb (89.8 kg)    Physical Exam Constitutional:      General: He is not in acute distress.    Appearance: Normal appearance. He is well-developed.  HENT:     Head: Normocephalic and atraumatic.     Right Ear: External ear normal.     Left Ear: External ear normal.  Eyes:     General: No scleral icterus.       Right eye: No discharge.        Left eye: No discharge.     Conjunctiva/sclera: Conjunctivae normal.  Neck:     Thyroid: No  thyromegaly.  Cardiovascular:     Rate and Rhythm: Normal rate and regular rhythm.  Pulmonary:     Effort: No respiratory distress.     Breath sounds: Normal breath sounds. No wheezing.  Abdominal:     General: Bowel sounds are normal.     Palpations: Abdomen is soft.     Tenderness: There is no abdominal  tenderness.  Musculoskeletal:        General: No swelling or tenderness.     Cervical back: Neck supple. No tenderness.  Lymphadenopathy:     Cervical: No cervical adenopathy.  Skin:    Findings: No erythema or rash.  Neurological:     Mental Status: He is alert and oriented to person, place, and time.  Psychiatric:        Mood and Affect: Mood normal.        Behavior: Behavior normal.      Outpatient Encounter Medications as of 01/22/2023  Medication Sig   ibuprofen (ADVIL) 800 MG tablet TAKE 1 TABLET BY MOUTH 2 TIMES DAILY AS NEEDED.   Multiple Vitamin (MULTIVITAMIN) tablet Take 1 tablet by mouth daily.   No facility-administered encounter medications on file as of 01/22/2023.     Lab Results  Component Value Date   WBC 5.9 11/03/2022   HGB 14.1 11/03/2022   HCT 41.8 11/03/2022   PLT 164 11/03/2022   GLUCOSE 107 (H) 11/03/2022   CHOL 149 08/21/2022   TRIG 38.0 08/21/2022   HDL 60.90 08/21/2022   LDLCALC 81 08/21/2022   ALT 40 11/03/2022   AST 43 (H) 11/03/2022   NA 135 11/03/2022   K 4.0 11/03/2022   CL 102 11/03/2022   CREATININE 0.86 11/03/2022   BUN 23 (H) 11/03/2022   CO2 25 11/03/2022   TSH 2.69 08/21/2022   PSA 0.64 02/28/2022    CT ANGIO CHEST AORTA W/CM & OR WO/CM  Result Date: 12/17/2022 CLINICAL DATA:  Follow-up aortic aneurysm, currently asymptomatic EXAM: CT ANGIOGRAPHY CHEST WITH CONTRAST TECHNIQUE: Multidetector CT imaging of the chest was performed using the standard protocol during bolus administration of intravenous contrast. Multiplanar CT image reconstructions and MIPs were obtained to evaluate the vascular anatomy. RADIATION DOSE REDUCTION:  This exam was performed according to the departmental dose-optimization program which includes automated exposure control, adjustment of the mA and/or kV according to patient size and/or use of iterative reconstruction technique. CONTRAST:  75mL OMNIPAQUE IOHEXOL 350 MG/ML SOLN COMPARISON:  None Available. FINDINGS: Cardiovascular: SVC patent. No pericardial effusion. Heart size normal. The RV is nondilated. Satisfactory opacification of pulmonary arteries noted, and there is no evidence of pulmonary emboli. Good contrast opacification of the thoracic aorta without dissection or stenosis. Classic 3-vessel brachiocephalic arterial origin anatomy without proximal stenosis. Aortic Root: --Valve: 3.2 cm --Sinuses: 5.2 cm --Sinotubular Junction: 3.8 cm Limitations by motion: Mild Thoracic Aorta: --Ascending Aorta: 3.9 cm --Aortic Arch: 3.1 cm --Descending Aorta: 2.6 cm Mediastinum/Nodes: No mass or adenopathy. Lungs/Pleura: No pleural effusion. No pneumothorax. Lungs are clear. Upper Abdomen: Several well-marginated low-attenuation liver lesions largest 2.9 cm 14 HU in segment 7, likely cysts. No acute findings. Musculoskeletal: No chest wall abnormality. No acute or significant osseous findings. Review of the MIP images confirms the above findings. IMPRESSION: 1. 5.2 cm aortic sinus aneurysm. Remainder of thoracic aorta normal in caliber. Electronically Signed   By: Corlis Leak M.D.   On: 12/17/2022 16:55       Assessment & Plan:  Routine general medical examination at a health care facility  Light headedness Assessment & Plan: Noticed light headedness - after bending over.  Discussed staying hydrated.  Given history of dilated aortic root, EKG obtained.  EKG - SR/SB with no acute ischemic changes.  Recently evaluated by cardiology.  Previous w/up as outlined.  Scheduled to see Dr Kizzie Bane - 03/05/23.    Orders: -     EKG 12-Lead  Aortic root dilatation [facility]) Assessment & Plan: 2D echocardiogram was  performed 07/26/2020 which revealed normal left ventricular function, with LVEF greater than 55%, with trivial valvular insufficiencies, and mildly dilated aortic root of 4.3 cm. Recommended CT scan to evaluate aortic root. F/u CT scan 12/17/22 - 5.2 cm aortic sinus aneurysm. Remainder of thoracic aorta normal in caliber.  Referred to Dr Kizzie Bane.  Scheduled f/u ECHO and appt with Dr Kizzie Bane 03/05/23.   Orders: -     Basic metabolic panel; Future -     Hepatic function panel; Future -     Lipid panel; Future  BPH with obstruction/lower urinary tract symptoms Assessment & Plan: Dr Apolinar Junes 09/2022 - planning for urolift S/p urolift with 6 implants (11/03/22) - Dr Apolinar Junes.  Follow up 12/2022 (Dr Apolinar Junes) - BPH with urinary frequency. Status post Uro lift. Continues to have weak stream and urgency. He is emptying his bladder well. Trial of gemtesa. Can not tell a difference with gemtesa.  Elected to monitor.    Healthcare maintenance Assessment & Plan: Physical today 01/22/23. Colonoscopy 01/2016 as outlined.  Recommended f/u in 01/2021.  Colonoscopy 07/12/21 - tubular adenoma.  Recommended f/u colonoscopy in 5 years. Need to confirm if has had f/u colonoscopy.  PSA 02/28/22 - .64.    Hx of colonic polyp Assessment & Plan: Colonoscopy 07/12/21 - tubular adenoma.  Recommended f/u colonoscopy in 5 years.     Thrombocytopenia (HCC) Assessment & Plan: Follow cbc.    SOB (shortness of breath) Assessment & Plan: EKG as outlined.  Symptoms appear to be worse when lying down.  Discussed sleep apnea and further w/up.  Declines.  Discussed risk of untreated sleep apnea.  Will notify me if changes mind.     Vitamin D deficiency Assessment & Plan: Recheck vitamin D level with next labs.   Orders: -     VITAMIN D 25 Hydroxy (Vit-D Deficiency, Fractures); Future     Dale Balfour, MD

## 2023-01-25 ENCOUNTER — Encounter: Payer: Self-pay | Admitting: Internal Medicine

## 2023-01-25 NOTE — Assessment & Plan Note (Signed)
Noticed light headedness - after bending over.  Discussed staying hydrated.  Given history of dilated aortic root, EKG obtained.  EKG - SR/SB with no acute ischemic changes.  Recently evaluated by cardiology.  Previous w/up as outlined.  Scheduled to see Dr Kizzie Bane - 03/05/23.

## 2023-01-25 NOTE — Assessment & Plan Note (Signed)
Dr Apolinar Junes 09/2022 - planning for urolift S/p urolift with 6 implants (11/03/22) - Dr Apolinar Junes.  Follow up 12/2022 (Dr Apolinar Junes) - BPH with urinary frequency. Status post Uro lift. Continues to have weak stream and urgency. He is emptying his bladder well. Trial of gemtesa. Can not tell a difference with gemtesa.  Elected to monitor.

## 2023-01-25 NOTE — Assessment & Plan Note (Addendum)
Physical today 01/22/23. Colonoscopy 01/2016 as outlined.  Recommended f/u in 01/2021.  Colonoscopy 07/12/21 - tubular adenoma.  Recommended f/u colonoscopy in 5 years. Need to confirm if has had f/u colonoscopy.  PSA 02/28/22 - .64.

## 2023-01-25 NOTE — Assessment & Plan Note (Signed)
2D echocardiogram was performed 07/26/2020 which revealed normal left ventricular function, with LVEF greater than 55%, with trivial valvular insufficiencies, and mildly dilated aortic root of 4.3 cm. Recommended CT scan to evaluate aortic root. F/u CT scan 12/17/22 - 5.2 cm aortic sinus aneurysm. Remainder of thoracic aorta normal in caliber.  Referred to Dr Kizzie Bane.  Scheduled f/u ECHO and appt with Dr Kizzie Bane 03/05/23.

## 2023-01-25 NOTE — Assessment & Plan Note (Signed)
Colonoscopy 07/12/21 - tubular adenoma.  Recommended f/u colonoscopy in 5 years.

## 2023-01-25 NOTE — Assessment & Plan Note (Signed)
EKG as outlined.  Symptoms appear to be worse when lying down.  Discussed sleep apnea and further w/up.  Declines.  Discussed risk of untreated sleep apnea.  Will notify me if changes mind.

## 2023-01-25 NOTE — Assessment & Plan Note (Signed)
Follow cbc.  

## 2023-01-25 NOTE — Assessment & Plan Note (Signed)
Recheck vitamin D level with next labs.  

## 2023-02-02 ENCOUNTER — Other Ambulatory Visit (INDEPENDENT_AMBULATORY_CARE_PROVIDER_SITE_OTHER): Payer: BC Managed Care – PPO

## 2023-02-02 DIAGNOSIS — I77810 Thoracic aortic ectasia: Secondary | ICD-10-CM

## 2023-02-02 DIAGNOSIS — E559 Vitamin D deficiency, unspecified: Secondary | ICD-10-CM | POA: Diagnosis not present

## 2023-02-02 LAB — BASIC METABOLIC PANEL
BUN: 35 mg/dL — ABNORMAL HIGH (ref 6–23)
CO2: 25 mEq/L (ref 19–32)
Calcium: 9.4 mg/dL (ref 8.4–10.5)
Chloride: 109 mEq/L (ref 96–112)
Creatinine, Ser: 1.04 mg/dL (ref 0.40–1.50)
GFR: 80.6 mL/min (ref >60.00)
Glucose, Bld: 98 mg/dL (ref 70–99)
Potassium: 4.2 mEq/L (ref 3.5–5.1)
Sodium: 142 mEq/L (ref 135–145)

## 2023-02-02 LAB — LIPID PANEL
Cholesterol: 156 mg/dL (ref 0–200)
HDL: 56.7 mg/dL (ref >39.00)
LDL Cholesterol: 91 mg/dL (ref 0–99)
NonHDL: 99.07
Total CHOL/HDL Ratio: 3
Triglycerides: 41 mg/dL (ref 0.0–149.0)
VLDL: 8.2 mg/dL (ref 0.0–40.0)

## 2023-02-02 LAB — HEPATIC FUNCTION PANEL
ALT: 15 U/L (ref 0–53)
AST: 20 U/L (ref 0–37)
Albumin: 4.3 g/dL (ref 3.5–5.2)
Alkaline Phosphatase: 55 U/L (ref 39–117)
Bilirubin, Direct: 0.1 mg/dL (ref 0.0–0.3)
Total Bilirubin: 0.7 mg/dL (ref 0.2–1.2)
Total Protein: 6.6 g/dL (ref 6.0–8.3)

## 2023-02-02 LAB — VITAMIN D 25 HYDROXY (VIT D DEFICIENCY, FRACTURES): VITD: 33.65 ng/mL (ref 30.00–100.00)

## 2023-02-25 DIAGNOSIS — H0100A Unspecified blepharitis right eye, upper and lower eyelids: Secondary | ICD-10-CM | POA: Diagnosis not present

## 2023-02-25 DIAGNOSIS — Z961 Presence of intraocular lens: Secondary | ICD-10-CM | POA: Diagnosis not present

## 2023-02-25 DIAGNOSIS — H0012 Chalazion right lower eyelid: Secondary | ICD-10-CM | POA: Diagnosis not present

## 2023-03-05 ENCOUNTER — Ambulatory Visit: Payer: BC Managed Care – PPO | Admitting: Dermatology

## 2023-03-05 DIAGNOSIS — I77810 Thoracic aortic ectasia: Secondary | ICD-10-CM | POA: Diagnosis not present

## 2023-03-09 ENCOUNTER — Encounter: Payer: Self-pay | Admitting: Dermatology

## 2023-03-09 ENCOUNTER — Ambulatory Visit: Payer: BC Managed Care – PPO | Admitting: Dermatology

## 2023-03-09 VITALS — BP 146/77 | HR 67

## 2023-03-09 DIAGNOSIS — L570 Actinic keratosis: Secondary | ICD-10-CM | POA: Diagnosis not present

## 2023-03-09 DIAGNOSIS — R21 Rash and other nonspecific skin eruption: Secondary | ICD-10-CM

## 2023-03-09 DIAGNOSIS — W908XXA Exposure to other nonionizing radiation, initial encounter: Secondary | ICD-10-CM | POA: Diagnosis not present

## 2023-03-09 DIAGNOSIS — L219 Seborrheic dermatitis, unspecified: Secondary | ICD-10-CM

## 2023-03-09 NOTE — Progress Notes (Signed)
Follow Up Visit   Subjective  Paul Pruitt is a 57 y.o. male who presents for the following: Rash - comes and goes started about a year ago. Patient was concerned because it was spreading on his face, other aa's were the neck and arms. When rash occurs it doesn't itch, and he was prescribed a topical medication in the past which helped resolve rash, but he is unsure of the name of it.  Recurred again last week, but has since improved, patient doesn't have photos of what rash looked like.   The following portions of the chart were reviewed this encounter and updated as appropriate: medications, allergies, medical history  Review of Systems:  No other skin or systemic complaints except as noted in HPI or Assessment and Plan.  Objective  Well appearing patient in no apparent distress; mood and affect are within normal limits.  A focused examination was performed of the following areas: the face, neck, back, arms, and chest, and abdomen   Relevant exam findings are noted in the Assessment and Plan.  R temple x 2, L lower forehead x 1, L helix x 1, R peri nasal x 1 (5) Erythematous thin papules/macules with gritty scale.     Assessment & Plan   AK (actinic keratosis) (5) R temple x 2, L lower forehead x 1, L helix x 1, R peri nasal x 1  Actinic keratoses are precancerous spots that appear secondary to cumulative UV radiation exposure/sun exposure over time. They are chronic with expected duration over 1 year. A portion of actinic keratoses will progress to squamous cell carcinoma of the skin. It is not possible to reliably predict which spots will progress to skin cancer and so treatment is recommended to prevent development of skin cancer.  Recommend daily broad spectrum sunscreen SPF 30+ to sun-exposed areas, reapply every 2 hours as needed.  Recommend staying in the shade or wearing long sleeves, sun glasses (UVA+UVB protection) and wide brim hats (4-inch brim around the entire  circumference of the hat). Call for new or changing lesions.   Destruction of lesion - R temple x 2, L lower forehead x 1, L helix x 1, R peri nasal x 1 (5) Complexity: simple   Destruction method: cryotherapy   Informed consent: discussed and consent obtained   Timeout:  patient name, date of birth, surgical site, and procedure verified Lesion destroyed using liquid nitrogen: Yes   Region frozen until ice ball extended beyond lesion: Yes   Outcome: patient tolerated procedure well with no complications   Post-procedure details: wound care instructions given    Rash  Seborrheic dermatitis   RASH Exam: Slight folliculitis from shaving otherwise clear on the neck.  Treatment Plan: If/when rash recurs will work patient in to be seen, and may over book if necessary. No treatment needed today since no rash present.    Possible SEBORRHEIC DERMATITIS Exam: Scale of the eyebrows and erythematous scaly patches of b/l perinasal skin  Chronic and persistent condition with duration or expected duration over one year. Condition is symptomatic / bothersome to patient. Not to goal.  Seborrheic Dermatitis is a chronic persistent rash characterized by pinkness and scaling most commonly of the mid face but also can occur on the scalp (dandruff), ears; mid chest, mid back and groin.  It tends to be exacerbated by stress and cooler weather.  People who have neurologic disease may experience new onset or exacerbation of existing seborrheic dermatitis.  The condition is not  curable but treatable and can be controlled.  Treatment Plan: Pt reports no previous hx of flaking/scale of the face or scalp. Patient defers treatment today. will observe, and if persistent or bothersome plan Ketoconazole 2% cream.   Return for appointment as scheduled.  Maylene Roes, CMA, am acting as scribe for Elie Goody, MD .  Documentation: I have reviewed the above documentation for accuracy and completeness,  and I agree with the above.  Elie Goody, MD

## 2023-03-09 NOTE — Patient Instructions (Signed)

## 2023-03-10 ENCOUNTER — Ambulatory Visit: Payer: BC Managed Care – PPO | Admitting: Dermatology

## 2023-03-12 ENCOUNTER — Telehealth: Payer: Self-pay

## 2023-03-12 DIAGNOSIS — R0602 Shortness of breath: Secondary | ICD-10-CM

## 2023-03-12 NOTE — Telephone Encounter (Signed)
Called and spoke with patient to confirm nothing acute. Patient states he feels ok but would like to have a cxr done just to see what it shows. Wife mentioned this at her appt. We discussed. I have ordered cxr for [facility] per pt request. Patient is aware.

## 2023-03-13 ENCOUNTER — Ambulatory Visit
Admission: RE | Admit: 2023-03-13 | Discharge: 2023-03-13 | Disposition: A | Discharge status: Home / Self Care | Payer: BC Managed Care – PPO | Source: Ambulatory Visit | Attending: Internal Medicine | Admitting: Internal Medicine

## 2023-03-13 DIAGNOSIS — R0602 Shortness of breath: Secondary | ICD-10-CM | POA: Insufficient documentation

## 2023-03-14 DIAGNOSIS — R0602 Shortness of breath: Secondary | ICD-10-CM | POA: Diagnosis not present

## 2023-03-14 DIAGNOSIS — Z8679 Personal history of other diseases of the circulatory system: Secondary | ICD-10-CM | POA: Diagnosis not present

## 2023-03-14 DIAGNOSIS — R0600 Dyspnea, unspecified: Secondary | ICD-10-CM | POA: Diagnosis not present

## 2023-03-14 DIAGNOSIS — Z5329 Procedure and treatment not carried out because of patient's decision for other reasons: Secondary | ICD-10-CM | POA: Diagnosis not present

## 2023-03-24 DIAGNOSIS — R0600 Dyspnea, unspecified: Secondary | ICD-10-CM | POA: Diagnosis not present

## 2023-03-24 DIAGNOSIS — R0689 Other abnormalities of breathing: Secondary | ICD-10-CM | POA: Diagnosis not present

## 2023-03-24 DIAGNOSIS — Z2821 Immunization not carried out because of patient refusal: Secondary | ICD-10-CM | POA: Diagnosis not present

## 2023-03-24 DIAGNOSIS — Z23 Encounter for immunization: Secondary | ICD-10-CM | POA: Diagnosis not present

## 2023-03-25 DIAGNOSIS — I77810 Thoracic aortic ectasia: Secondary | ICD-10-CM | POA: Diagnosis not present

## 2023-05-06 DIAGNOSIS — H0012 Chalazion right lower eyelid: Secondary | ICD-10-CM | POA: Diagnosis not present

## 2023-06-05 DIAGNOSIS — I77810 Thoracic aortic ectasia: Secondary | ICD-10-CM | POA: Diagnosis not present

## 2023-06-12 ENCOUNTER — Other Ambulatory Visit: Payer: Self-pay | Admitting: Internal Medicine

## 2023-06-12 DIAGNOSIS — H0100A Unspecified blepharitis right eye, upper and lower eyelids: Secondary | ICD-10-CM | POA: Diagnosis not present

## 2023-06-17 NOTE — Telephone Encounter (Signed)
Rx sent in for ibuprofen

## 2023-06-19 DIAGNOSIS — I712 Thoracic aortic aneurysm, without rupture, unspecified: Secondary | ICD-10-CM | POA: Diagnosis not present

## 2023-07-07 DIAGNOSIS — Z23 Encounter for immunization: Secondary | ICD-10-CM | POA: Diagnosis not present

## 2023-08-04 ENCOUNTER — Ambulatory Visit: Payer: BC Managed Care – PPO | Admitting: Dermatology

## 2023-08-04 ENCOUNTER — Encounter: Payer: Self-pay | Admitting: Dermatology

## 2023-08-04 DIAGNOSIS — Z7189 Other specified counseling: Secondary | ICD-10-CM

## 2023-08-04 DIAGNOSIS — L820 Inflamed seborrheic keratosis: Secondary | ICD-10-CM | POA: Diagnosis not present

## 2023-08-04 DIAGNOSIS — Z1283 Encounter for screening for malignant neoplasm of skin: Secondary | ICD-10-CM | POA: Diagnosis not present

## 2023-08-04 DIAGNOSIS — Z86018 Personal history of other benign neoplasm: Secondary | ICD-10-CM

## 2023-08-04 DIAGNOSIS — L578 Other skin changes due to chronic exposure to nonionizing radiation: Secondary | ICD-10-CM

## 2023-08-04 DIAGNOSIS — L649 Androgenic alopecia, unspecified: Secondary | ICD-10-CM

## 2023-08-04 DIAGNOSIS — L814 Other melanin hyperpigmentation: Secondary | ICD-10-CM

## 2023-08-04 DIAGNOSIS — W908XXA Exposure to other nonionizing radiation, initial encounter: Secondary | ICD-10-CM

## 2023-08-04 DIAGNOSIS — L570 Actinic keratosis: Secondary | ICD-10-CM

## 2023-08-04 DIAGNOSIS — D229 Melanocytic nevi, unspecified: Secondary | ICD-10-CM

## 2023-08-04 DIAGNOSIS — I781 Nevus, non-neoplastic: Secondary | ICD-10-CM

## 2023-08-04 DIAGNOSIS — L821 Other seborrheic keratosis: Secondary | ICD-10-CM

## 2023-08-04 DIAGNOSIS — D1801 Hemangioma of skin and subcutaneous tissue: Secondary | ICD-10-CM

## 2023-08-04 DIAGNOSIS — D2271 Melanocytic nevi of right lower limb, including hip: Secondary | ICD-10-CM

## 2023-08-04 DIAGNOSIS — Z85820 Personal history of malignant melanoma of skin: Secondary | ICD-10-CM

## 2023-08-04 MED ORDER — FINASTERIDE 1 MG PO TABS: 1.0000 mg | ORAL_TABLET | Freq: Every day | ORAL | 1 refills | Status: DC

## 2023-08-04 MED ORDER — MINOXIDIL 2.5 MG PO TABS: 5.0000 mg | ORAL_TABLET | Freq: Every day | ORAL | 1 refills | Status: DC

## 2023-08-04 NOTE — Patient Instructions (Addendum)
Cryotherapy Aftercare  Wash gently with soap and water everyday.   Apply Vaseline Jelly daily until healed.    Start Minoxidil 2.5 mg one tablet once daily for 1 month. If tolerating well increase to 2 tablets daily.  Finasteride 1 mg take 1 tablet once daily.  Do not use topical minoxidil if taking oral minoxidil.   Doses of oral minoxidil for hair loss are considered 'low dose'. This is because the doses used for hair loss are much lower than the doses which are used for conditions such as high blood pressure (hypertension). The doses used for hypertension are 10-40mg  per day.  Side effects are uncommon at the low doses (up to 2.5 mg/day) used to treat hair loss. Potential side effects, more commonly seen at higher doses, include: Increase in hair growth (hypertrichosis) elsewhere on face and body Temporary hair shedding upon starting medication which may last up to 4 weeks Ankle swelling, fluid retention, rapid weight gain more than 5 pounds Low blood pressure and feeling lightheaded or dizzy when standing up quickly Fast or irregular heartbeat Headaches     Recommend daily broad spectrum sunscreen SPF 30+ to sun-exposed areas, reapply every 2 hours as needed. Call for new or changing lesions.  Staying in the shade or wearing long sleeves, sun glasses (UVA+UVB protection) and wide brim hats (4-inch brim around the entire circumference of the hat) are also recommended for sun protection.    Melanoma ABCDEs  Melanoma is the most dangerous type of skin cancer, and is the leading cause of death from skin disease.  You are more likely to develop melanoma if you: Have light-colored skin, light-colored eyes, or red or blond hair Spend a lot of time in the sun Tan regularly, either outdoors or in a tanning bed Have had blistering sunburns, especially during childhood Have a close family member who has had a melanoma Have atypical moles or large birthmarks  Early detection of melanoma  is key since treatment is typically straightforward and cure rates are extremely high if we catch it early.   The first sign of melanoma is often a change in a mole or a new dark spot.  The ABCDE system is a way of remembering the signs of melanoma.  A for asymmetry:  The two halves do not match. B for border:  The edges of the growth are irregular. C for color:  A mixture of colors are present instead of an even brown color. D for diameter:  Melanomas are usually (but not always) greater than 6mm - the size of a pencil eraser. E for evolution:  The spot keeps changing in size, shape, and color.  Please check your skin once per month between visits. You can use a small mirror in front and a large mirror behind you to keep an eye on the back side or your body.   If you see any new or changing lesions before your next follow-up, please call to schedule a visit.  Please continue daily skin protection including broad spectrum sunscreen SPF 30+ to sun-exposed areas, reapplying every 2 hours as needed when you're outdoors.   Staying in the shade or wearing long sleeves, sun glasses (UVA+UVB protection) and wide brim hats (4-inch brim around the entire circumference of the hat) are also recommended for sun protection.      Due to recent changes in healthcare laws, you may see results of your pathology and/or laboratory studies on MyChart before the doctors have had a chance to  review them. We understand that in some cases there may be results that are confusing or concerning to you. Please understand that not all results are received at the same time and often the doctors may need to interpret multiple results in order to provide you with the best plan of care or course of treatment. Therefore, we ask that you please give [place] 2 business days to thoroughly review all your results before contacting the office for clarification. Should we see a critical lab result, you will be contacted sooner.   If You  Need Anything After Your Visit  If you have any questions or concerns for your doctor, please call our main line at [phone] and press option 4 to reach your doctor's medical assistant. If no one answers, please leave a voicemail as directed and we will return your call as soon as possible. Messages left after 4 pm will be answered the following business day.   You may also send [place] a message via MyChart. We typically respond to MyChart messages within 1-2 business days.  For prescription refills, please ask your pharmacy to contact our office. Our fax number is [phone].  If you have an urgent issue when the clinic is closed that cannot wait until the next business day, you can page your doctor at the number below.    Please note that while we do our best to be available for urgent issues outside of office hours, we are not available 24/7.   If you have an urgent issue and are unable to reach [place], you may choose to seek medical care at your doctor's office, retail clinic, urgent care center, or emergency room.  If you have a medical emergency, please immediately call 911 or go to the emergency department.  Pager Numbers  - Dr. Gwen Pounds: [phone]  - Dr. Roseanne Reno: [phone]  - Dr. Katrinka Blazing: [phone]   In the event of inclement weather, please call our main line at [phone] for an update on the status of any delays or closures.  Dermatology Medication Tips: Please keep the boxes that topical medications come in in order to help keep track of the instructions about where and how to use these. Pharmacies typically print the medication instructions only on the boxes and not directly on the medication tubes.   If your medication is too expensive, please contact our office at [phone] option 4 or send [place] a message through MyChart.   We are unable to tell what your co-pay for medications will be in advance as this is different depending on your insurance coverage. However,  we may be able to find a substitute medication at lower cost or fill out paperwork to get insurance to cover a needed medication.   If a prior authorization is required to get your medication covered by your insurance company, please allow [place] 1-2 business days to complete this process.  Drug prices often vary depending on where the prescription is filled and some pharmacies may offer cheaper prices.  The website www.goodrx.com contains coupons for medications through different pharmacies. The prices here do not account for what the cost may be with help from insurance (it may be cheaper with your insurance), but the website can give you the price if you did not use any insurance.  - You can print the associated coupon and take it with your prescription to the pharmacy.  - You may also stop by our office during regular business hours and pick up a GoodRx coupon card.  -  If you need your prescription sent electronically to a different pharmacy, notify our office through [facility] or by phone at [phone] option 4.     Si Usted Necesita Algo Despus de Su Visita  Tambin puede enviarnos un mensaje a travs de Clinical cytogeneticist. Por lo general respondemos a los mensajes de MyChart en el transcurso de 1 a 2 das hbiles.  Para renovar recetas, por favor pida a su farmacia que se ponga en contacto con nuestra oficina. Annie Sable de fax es [place] [phone].  Si tiene un asunto urgente cuando la clnica est cerrada y que no puede esperar hasta el siguiente da hbil, puede llamar/localizar a su doctor(a) al nmero que aparece a continuacin.   Por favor, tenga en cuenta que aunque hacemos todo lo posible para estar disponibles para asuntos urgentes fuera del horario de [place], no estamos disponibles las 24 horas del da, los 7 [address]  [address] de la [place].   Si tiene un problema urgente y no puede comunicarse con nosotros, puede optar por buscar atencin mdica  en el consultorio de su doctor(a), en una  clnica privada, en un centro de atencin urgente o en una sala de emergencias.  Si tiene Engineer, drilling, por favor llame inmediatamente al 911 o vaya a la sala de emergencias.  Nmeros de bper  - Dr. Gwen Pounds: [phone]  - Dra. Roseanne Reno: [phone]  - Dr. Katrinka Blazing: [phone]   En caso de inclemencias del tiempo, por favor llame a Lacy Duverney principal al [phone] para una actualizacin sobre el [place] de cualquier retraso o cierre.  Consejos para la medicacin en dermatologa: Por favor, guarde las cajas en las que vienen los medicamentos de uso tpico para ayudarle a seguir las instrucciones sobre dnde y cmo usarlos. Las farmacias generalmente imprimen las instrucciones del medicamento slo en las cajas y no directamente en los tubos del [place].   Si su medicamento es muy caro, por favor, pngase en contacto con Rolm Gala llamando al [phone] y presione la opcin 4 o envenos un mensaje a travs de Clinical cytogeneticist.   No podemos decirle cul ser su copago por los medicamentos por adelantado ya que esto es diferente dependiendo de la cobertura de su seguro. Sin embargo, es posible que podamos encontrar un medicamento sustituto a Audiological scientist un formulario para que el seguro cubra el medicamento que se considera necesario.   Si se requiere una autorizacin previa para que su compaa de seguros [place] su medicamento, por favor permtanos de 1 a 2 das hbiles para completar [address]  Los precios de los medicamentos varan con frecuencia dependiendo del Environmental consultant de dnde se surte la receta y alguna farmacias pueden ofrecer precios ms baratos.  El sitio web www.goodrx.com tiene cupones para medicamentos de Health and safety inspector. Los precios aqu no tienen en cuenta lo que podra costar con la ayuda del seguro (puede ser ms barato con su seguro), pero el sitio web puede darle el precio si no utiliz Tourist information centre manager.  - Puede imprimir el cupn correspondiente  y llevarlo con su receta a la farmacia.  - Tambin puede pasar por nuestra oficina durante el horario de atencin regular y Education officer, museum una tarjeta de cupones de GoodRx.  - Si necesita que su receta se enve electrnicamente a una farmacia diferente, informe a nuestra oficina a travs de MyChart de  o por telfono llamando al [phone] y presione la opcin 4.

## 2023-08-04 NOTE — Progress Notes (Signed)
Follow-Up Visit   Subjective  Paul Pruitt is a 58 y.o. male who presents for the following: Skin Cancer Screening and Full Body Skin Exam. Hx of MM. Hx of multiple dysplastic nevi. Hx of AKs. Areas of concern on neck and face. Bothersome, irritated.   The patient presents for Total-Body Skin Exam (TBSE) for skin cancer screening and mole check. The patient has spots, moles and lesions to be evaluated, some may be new or changing and the patient may have concern these could be cancer.    The following portions of the chart were reviewed this encounter and updated as appropriate: medications, allergies, medical history  Review of Systems:  No other skin or systemic complaints except as noted in HPI or Assessment and Plan.  Objective  Well appearing patient in no apparent distress; mood and affect are within normal limits.  A full examination was performed including scalp, head, eyes, ears, nose, lips, neck, chest, axillae, abdomen, back, buttocks, bilateral upper extremities, bilateral lower extremities, hands, feet, fingers, toes, fingernails, and toenails. All findings within normal limits unless otherwise noted below.   Relevant physical exam findings are noted in the Assessment and Plan.  left anterior/lower neck x10, left infraocular x1 (11) Erythematous keratotic or waxy stuck-on papule  Right Tip of Nose x1 Erythematous thin papules/macules with gritty scale.                 Assessment & Plan   SKIN CANCER SCREENING PERFORMED TODAY.  HISTORY OF MELANOMA. Left pectoral. 1995. - No evidence of recurrence today - Recommend regular full body skin exams - Recommend daily broad spectrum sunscreen SPF 30+ to sun-exposed areas, reapply every 2 hours as needed.  - Call if any new or changing lesions are noted between office visits   HISTORY OF DYSPLASTIC NEVUS. Multiple sites, see history.  No evidence of recurrence today Recommend regular full body skin  exams Recommend daily broad spectrum sunscreen SPF 30+ to sun-exposed areas, reapply every 2 hours as needed.  Call if any new or changing lesions are noted between office visits   ACTINIC DAMAGE - Chronic condition, secondary to cumulative UV/sun exposure - diffuse scaly erythematous macules with underlying dyspigmentation - Recommend daily broad spectrum sunscreen SPF 30+ to sun-exposed areas, reapply every 2 hours as needed.  - Staying in the shade or wearing long sleeves, sun glasses (UVA+UVB protection) and wide brim hats (4-inch brim around the entire circumference of the hat) are also recommended for sun protection.  - Call for new or changing lesions.  LENTIGINES, SEBORRHEIC KERATOSES, HEMANGIOMAS - Benign normal skin lesions. Torso. - Benign-appearing - Call for any changes  MELANOCYTIC NEVI. Torso. - Tan-brown and/or pink-flesh-colored symmetric macules and papules - 2 mm medium brown macule at right anterior thigh - Benign appearing on exam today - Observation - Call clinic for new or changing moles - Recommend daily use of broad spectrum spf 30+ sunscreen to sun-exposed areas.    TELANGIECTASIA Exam: dilated blood vessels at glabella c/w telangiectasia  Treatment Plan: Benign appearing on exam Discussed BBL to fade Call for changes   Counseling for BBL / IPL / Laser and Coordination of Care Discussed the treatment option of Broad Band Light (BBL) /Intense Pulsed Light (IPL)/ Laser for skin discoloration, including brown spots and redness.  Typically we recommend at least 1-3 treatment sessions about 5-8 weeks apart for best results.  Cannot have tanned skin when BBL performed, and regular use of sunscreen/photoprotection is advised after the  procedure to help maintain results. The patient's condition may also require "maintenance treatments" in the future.  The fee for BBL / laser treatments is $350 per treatment session for the whole face, $200 for single area.  A fee  can be quoted for other parts of the body.  Insurance typically does not pay for BBL/laser treatments and therefore the fee is an out-of-pocket cost. Recommend prophylactic valtrex treatment. Once scheduled for procedure, will send Rx in prior to patient's appointment.    ANDROGENETIC ALOPECIA (MALE PATTERN HAIR LOSS) Exam: Frontal scalp, temporal, and vertex hair thinning with intact frontal hairline and miniaturization.  See photos  Chronic and persistent condition with duration or expected duration over one year. Condition is symptomatic/ bothersome to patient. Not currently at goal on topical Rogaine.   Androgenetic Alopecia (or Male pattern hair loss) refers to the common patterned hair loss affecting many men.  Male pattern alopecia is mediated by dihydrotestosterone which induces miniaturization of androgen-sensitive hair follicles.  It is chronic and persistent, but treatable; not curable. Topical treatment includes: - 5% topical Minoxidil Oral treatment includes: - Finasteride 1 mg qd - Minoxidil 1.25 - 5 mg qd - Dutasteride 0.5 mg qd Adjunct therapy includes: - Low Level Laser Light Therapy (LLLT) - Platelet-rich Plasma injections (PRP) - Hair Transplantation or scalp reduction  Treatment Plan: Start Minoxidil 2.5 mg one tablet once daily for 1 month. If tolerating well increase to 2 tablets daily. Do not use topical minoxidil if taking oral minoxidil. Start Finasteride 1 mg take 1 tablet once daily.  Doses of oral minoxidil for hair loss are considered 'low dose'. This is because the doses used for hair loss are much lower than the doses which are used for conditions such as high blood pressure (hypertension). The doses used for hypertension are 10-40mg  per day.  Side effects are uncommon at the low doses (up to 2.5 mg/day) used to treat hair loss. Potential side effects, more commonly seen at higher doses, include: Increase in hair growth (hypertrichosis) elsewhere on face and  body Temporary hair shedding upon starting medication which may last up to 4 weeks Ankle swelling, fluid retention, rapid weight gain more than 5 pounds Low blood pressure and feeling lightheaded or dizzy when standing up quickly Fast or irregular heartbeat Headaches  Counseled that finasteride can decrease libido (sexual drive). Advised it should not be taken by pregnant women or women who could become pregnant. Advised not to donate blood products while taking this medication. Advised if medication is stopped, they may lose the hair the medication has been helping to grow.   Long term medication management.  Patient is using long term (months to years) prescription medication  to control their dermatologic condition.  These medications require periodic monitoring to evaluate for efficacy and side effects and may require periodic laboratory monitoring.    INFLAMED SEBORRHEIC KERATOSIS (11) left anterior/lower neck x10, left infraocular x1 (11) Symptomatic, irritating, patient would like treated. Destruction of lesion - left anterior/lower neck x10, left infraocular x1 (11)  Destruction method: cryotherapy   Informed consent: discussed and consent obtained   Lesion destroyed using liquid nitrogen: Yes   Region frozen until ice ball extended beyond lesion: Yes   Outcome: patient tolerated procedure well with no complications   Post-procedure details: wound care instructions given   Additional details:  Prior to procedure, discussed risks of blister formation, small wound, skin dyspigmentation, or rare scar following cryotherapy. Recommend Vaseline ointment to treated areas while healing.  AK (ACTINIC KERATOSIS) Right Tip of Nose x1 Actinic keratoses are precancerous spots that appear secondary to cumulative UV radiation exposure/sun exposure over time. They are chronic with expected duration over 1 year. A portion of actinic keratoses will progress to squamous cell carcinoma of the skin. It  is not possible to reliably predict which spots will progress to skin cancer and so treatment is recommended to prevent development of skin cancer.  Recommend daily broad spectrum sunscreen SPF 30+ to sun-exposed areas, reapply every 2 hours as needed.  Recommend staying in the shade or wearing long sleeves, sun glasses (UVA+UVB protection) and wide brim hats (4-inch brim around the entire circumference of the hat). Call for new or changing lesions. Destruction of lesion - Right Tip of Nose x1  Destruction method: cryotherapy   Informed consent: discussed and consent obtained   Lesion destroyed using liquid nitrogen: Yes   Region frozen until ice ball extended beyond lesion: Yes   Outcome: patient tolerated procedure well with no complications   Post-procedure details: wound care instructions given   Additional details:  Prior to procedure, discussed risks of blister formation, small wound, skin dyspigmentation, or rare scar following cryotherapy. Recommend Vaseline ointment to treated areas while healing.   Return in about 1 year (around 08/03/2024) for TBSE, HxMM, HxDN, HxAK; Androgenetic alopecia in 6 months.  I, Lawson Radar, CMA, am acting as scribe for Willeen Niece, MD.   Documentation: I have reviewed the above documentation for accuracy and completeness, and I agree with the above.  Willeen Niece, MD

## 2023-08-05 ENCOUNTER — Ambulatory Visit: Payer: BC Managed Care – PPO | Admitting: Dermatology

## 2023-09-14 DIAGNOSIS — H04123 Dry eye syndrome of bilateral lacrimal glands: Secondary | ICD-10-CM | POA: Diagnosis not present

## 2023-09-14 DIAGNOSIS — H0100A Unspecified blepharitis right eye, upper and lower eyelids: Secondary | ICD-10-CM | POA: Diagnosis not present

## 2023-09-14 DIAGNOSIS — Z961 Presence of intraocular lens: Secondary | ICD-10-CM | POA: Diagnosis not present

## 2023-12-15 ENCOUNTER — Encounter: Payer: Self-pay | Admitting: Internal Medicine

## 2023-12-17 DIAGNOSIS — J019 Acute sinusitis, unspecified: Secondary | ICD-10-CM | POA: Diagnosis not present

## 2023-12-17 DIAGNOSIS — R509 Fever, unspecified: Secondary | ICD-10-CM | POA: Diagnosis not present

## 2023-12-17 DIAGNOSIS — R051 Acute cough: Secondary | ICD-10-CM | POA: Diagnosis not present

## 2023-12-17 DIAGNOSIS — J9801 Acute bronchospasm: Secondary | ICD-10-CM | POA: Diagnosis not present

## 2023-12-21 ENCOUNTER — Ambulatory Visit: Admitting: Internal Medicine

## 2023-12-21 ENCOUNTER — Ambulatory Visit: Payer: Self-pay | Admitting: Internal Medicine

## 2023-12-21 ENCOUNTER — Telehealth: Payer: Self-pay | Admitting: Internal Medicine

## 2023-12-21 VITALS — BP 130/72 | HR 63 | Temp 97.4°F | Ht 76.0 in | Wt 200.0 lb

## 2023-12-21 DIAGNOSIS — J019 Acute sinusitis, unspecified: Secondary | ICD-10-CM | POA: Diagnosis not present

## 2023-12-21 DIAGNOSIS — D649 Anemia, unspecified: Secondary | ICD-10-CM | POA: Diagnosis not present

## 2023-12-21 LAB — IBC + FERRITIN
Ferritin: 43.6 ng/mL (ref 22.0–322.0)
Iron: 117 ug/dL (ref 42–165)
Saturation Ratios: 43.8 % (ref 20.0–50.0)
TIBC: 267.4 ug/dL (ref 250.0–450.0)
Transferrin: 191 mg/dL — ABNORMAL LOW (ref 212.0–360.0)

## 2023-12-21 LAB — B12 AND FOLATE PANEL
Folate: 9.2 ng/mL (ref >5.9)
Vitamin B-12: 340 pg/mL (ref 211–911)

## 2023-12-21 NOTE — Telephone Encounter (Signed)
 LVM for pt. Will send pt mychart message

## 2023-12-21 NOTE — Progress Notes (Signed)
 Acute Office Visit  Subjective:     Patient ID: Paul Pruitt, male    DOB: 1965-06-19, 57 y.o.   MRN: [id number]  Chief Complaint  Patient presents with   Cough    X 2 weeks, has been to urgent care    Cough Associated symptoms include headaches. Pertinent negatives include no hemoptysis, sore throat or wheezing.   Patient is in today for persistent cough for greater than 2 weeks.  Patient states that he was on vacation recently (approximately 2-3 weeks ago) and started feeling sick on vacation.  Patient states that since then he has had persistent cough which did not improve with over-the-counter cough medicine.  He started having chills on Thursday and went to urgent care where he was noted to have an elevated white blood cell count with a left shift on CBC.  They diagnosed him with an acute bacterial sinusitis as well as bronchitis with bronchospasm and prescribed him clarithromycin, prednisone , Tessalon Perles as well as Tussionex and Mucinex.  Patient states that the Tussionex has helped significantly with his cough at night but he has significant coughing during the day.  He did have a chest x-ray done at that time which showed no evidence of an underlying infection.  Of note, patient's CBC did show mild anemia with a hemoglobin of 13.4.  Patient does feel mildly fatigued from his underlying infection but is otherwise highly active.  Review of Systems  Constitutional: Negative.   HENT:  Positive for congestion. Negative for sinus pain and sore throat.   Respiratory:  Positive for cough. Negative for hemoptysis, sputum production, wheezing and stridor.   Cardiovascular: Negative.   Gastrointestinal: Negative.   Musculoskeletal: Negative.   Neurological:  Positive for headaches.       Patient complains of significant frontal headaches worse with cough as well as bending over  Psychiatric/Behavioral: Negative.          Objective:    BP 130/72   Pulse 63   Temp (!) 97.4 F  (36.3 C) (Oral)   Ht 6' 4 (1.93 m)   Wt 200 lb (90.7 kg)   SpO2 98%   BMI 24.34 kg/m    Physical Exam Constitutional:      Appearance: Normal appearance.  HENT:     Head: Normocephalic and atraumatic.     Nose:     Right Sinus: No maxillary sinus tenderness or frontal sinus tenderness.     Left Sinus: No maxillary sinus tenderness or frontal sinus tenderness.     Mouth/Throat:     Pharynx: Oropharynx is clear. No oropharyngeal exudate or posterior oropharyngeal erythema.  Cardiovascular:     Rate and Rhythm: Normal rate and regular rhythm.     Heart sounds: Normal heart sounds.  Pulmonary:     Effort: No respiratory distress.     Breath sounds: Normal breath sounds. No wheezing.  Musculoskeletal:     Cervical back: Neck supple.  Lymphadenopathy:     Cervical: No cervical adenopathy.  Neurological:     Mental Status: He is alert and oriented to person, place, and time. Mental status is at baseline.  Psychiatric:        Mood and Affect: Mood normal.        Behavior: Behavior normal.     No results found for any visits on 12/21/23.      Assessment & Plan:   Problem List Items Addressed This Visit       Respiratory  Acute sinusitis   - Patient has had persistent cough for greater than 2 weeks associated with headaches which are worse with bending over and coughing -He developed chills on Thursday and went to urgent care and was noted to have leukocytosis with left shift on CBC and have a normal chest x-ray.  He was diagnosed with an acute bacterial sinusitis as well as bronchitis with bronchospasm -He was started on clarithromycin, prednisone , Tussionex at night as well as Tessalon Perles and Mucinex for cough -Patient states that his symptoms have improved but have not resolved and he still has persistent cough during the day -On exam today, patient has no sinus tenderness to palpation.  Lungs are clear to auscultation bilaterally.  No pharyngeal erythema or exudate  noted.  No cervical adenopathy noted -Patient symptoms are slowly improving.  Will continue with prednisone  and clarithromycin for now as well as as needed cough medication -No indication for nebulizer at this time -Return precautions given to the patient -No further workup at this time      Relevant Medications   predniSONE  (DELTASONE ) 20 MG tablet   clarithromycin (BIAXIN) 500 MG tablet   chlorpheniramine-HYDROcodone (TUSSIONEX) 10-8 MG/5ML   benzonatate (TESSALON) 200 MG capsule   guaiFENesin (MUCINEX) 600 MG 12 hr tablet     Other   Anemia - Primary   - Patient was noted to be mildly anemic on his CBC done at urgent care with a hemoglobin of 13.4 -He does have a family history of colon cancer and states that he gets a colonoscopy every 3 years.  The last colonoscopy we had in our system was from 2017 which showed 2 polyps -Will recheck a CBC today in addition to B12/folate as well as iron studies -If patient does have iron deficiency anemia we will refer him back to GI for follow-up colonoscopy.      Relevant Orders   CBC with Differential/Platelet   IBC + Ferritin   B12 and Folate Panel    No orders of the defined types were placed in this encounter.   No follow-ups on file.  Cari Vandeberg, MD

## 2023-12-21 NOTE — Telephone Encounter (Signed)
 Patient states  predniSONE  (DELTASONE ) 20 MG tablet   fell in toilet, would like a call back to see what to do next if anything. Patient had an appt today and mentioned to provider.

## 2023-12-21 NOTE — Patient Instructions (Addendum)
-   It was a pleasure meeting you today -Continue with your current medications including the antibiotic and steroids for now -You can take the Tussionex during the day to help with the cough but this may cause excessive drowsiness.  Please do not drive a car or operate heavy machinery if you take this during the day -I was able to review the blood work from [facility].  Your platelet counts were normal but your white count was mildly elevated which I suspect is secondary to your underlying bacterial infection.  You are also mildly anemic.  Will recheck your blood counts today as well as your iron studies and B12/folic acid  levels  -Please contact us  if you have any questions or concerns or if your symptoms worsen despite treatment

## 2023-12-21 NOTE — Assessment & Plan Note (Signed)
-   Patient has had persistent cough for greater than 2 weeks associated with headaches which are worse with bending over and coughing -He developed chills on Thursday and went to urgent care and was noted to have leukocytosis with left shift on CBC and have a normal chest x-ray.  He was diagnosed with an acute bacterial sinusitis as well as bronchitis with bronchospasm -He was started on clarithromycin, prednisone , Tussionex at night as well as Tessalon Perles and Mucinex for cough -Patient states that his symptoms have improved but have not resolved and he still has persistent cough during the day -On exam today, patient has no sinus tenderness to palpation.  Lungs are clear to auscultation bilaterally.  No pharyngeal erythema or exudate noted.  No cervical adenopathy noted -Patient symptoms are slowly improving.  Will continue with prednisone  and clarithromycin for now as well as as needed cough medication -No indication for nebulizer at this time -Return precautions given to the patient -No further workup at this time

## 2023-12-21 NOTE — Assessment & Plan Note (Signed)
-   Patient was noted to be mildly anemic on his CBC done at urgent care with a hemoglobin of 13.4 -He does have a family history of colon cancer and states that he gets a colonoscopy every 3 years.  The last colonoscopy we had in our system was from 2017 which showed 2 polyps -Will recheck a CBC today in addition to B12/folate as well as iron studies -If patient does have iron deficiency anemia we will refer him back to GI for follow-up colonoscopy.

## 2023-12-22 ENCOUNTER — Other Ambulatory Visit: Payer: Self-pay | Admitting: Internal Medicine

## 2023-12-22 MED ORDER — IBUPROFEN 800 MG PO TABS: 800.0000 mg | ORAL_TABLET | Freq: Two times a day (BID) | ORAL | 0 refills | Status: AC | PRN

## 2023-12-28 ENCOUNTER — Ambulatory Visit: Admitting: Internal Medicine

## 2023-12-29 ENCOUNTER — Other Ambulatory Visit (INDEPENDENT_AMBULATORY_CARE_PROVIDER_SITE_OTHER)

## 2023-12-29 DIAGNOSIS — D649 Anemia, unspecified: Secondary | ICD-10-CM | POA: Diagnosis not present

## 2023-12-30 LAB — CBC WITH DIFFERENTIAL/PLATELET
Basophils Absolute: 0.1 K/uL (ref 0.0–0.1)
Basophils Relative: 1.1 % (ref 0.0–3.0)
Eosinophils Absolute: 0.1 K/uL (ref 0.0–0.7)
Eosinophils Relative: 1.9 % (ref 0.0–5.0)
HCT: 41.5 % (ref 39.0–52.0)
Hemoglobin: 14 g/dL (ref 13.0–17.0)
Lymphocytes Relative: 24.3 % (ref 12.0–46.0)
Lymphs Abs: 1.7 K/uL (ref 0.7–4.0)
MCHC: 33.8 g/dL (ref 30.0–36.0)
MCV: 87.8 fl (ref 78.0–100.0)
Monocytes Absolute: 0.6 K/uL (ref 0.1–1.0)
Monocytes Relative: 8 % (ref 3.0–12.0)
Neutro Abs: 4.6 K/uL (ref 1.4–7.7)
Neutrophils Relative %: 64.7 % (ref 43.0–77.0)
Platelets: 234 K/uL (ref 150.0–400.0)
RBC: 4.73 Mil/uL (ref 4.22–5.81)
RDW: 12.5 % (ref 11.5–15.5)
WBC: 7.1 K/uL (ref 4.0–10.5)

## 2024-01-19 ENCOUNTER — Ambulatory Visit: Payer: BC Managed Care – PPO | Admitting: Dermatology

## 2024-01-21 ENCOUNTER — Ambulatory Visit: Admitting: Dermatology

## 2024-01-21 DIAGNOSIS — Z85820 Personal history of malignant melanoma of skin: Secondary | ICD-10-CM

## 2024-01-21 DIAGNOSIS — L820 Inflamed seborrheic keratosis: Secondary | ICD-10-CM | POA: Diagnosis not present

## 2024-01-21 DIAGNOSIS — Z86018 Personal history of other benign neoplasm: Secondary | ICD-10-CM

## 2024-01-21 DIAGNOSIS — I781 Nevus, non-neoplastic: Secondary | ICD-10-CM

## 2024-01-21 DIAGNOSIS — Z1283 Encounter for screening for malignant neoplasm of skin: Secondary | ICD-10-CM

## 2024-01-21 DIAGNOSIS — W908XXA Exposure to other nonionizing radiation, initial encounter: Secondary | ICD-10-CM

## 2024-01-21 DIAGNOSIS — L649 Androgenic alopecia, unspecified: Secondary | ICD-10-CM | POA: Diagnosis not present

## 2024-01-21 DIAGNOSIS — D229 Melanocytic nevi, unspecified: Secondary | ICD-10-CM | POA: Diagnosis not present

## 2024-01-21 DIAGNOSIS — Z79899 Other long term (current) drug therapy: Secondary | ICD-10-CM

## 2024-01-21 DIAGNOSIS — L578 Other skin changes due to chronic exposure to nonionizing radiation: Secondary | ICD-10-CM | POA: Diagnosis not present

## 2024-01-21 MED ORDER — MINOXIDIL 2.5 MG PO TABS
ORAL_TABLET | ORAL | 1 refills | Status: AC
Start: 2024-01-21 — End: ?

## 2024-01-21 MED ORDER — FINASTERIDE 1 MG PO TABS
1.0000 mg | ORAL_TABLET | Freq: Every day | ORAL | 3 refills | Status: AC
Start: 2024-01-21 — End: ?

## 2024-01-21 NOTE — Progress Notes (Signed)
 Follow-Up Visit   Subjective  Paul Pruitt is a 58 y.o. male who presents for the following: Skin Cancer Screening and Upper Body Skin Exam Hx of melanoma, hx of dysplastic, hx of androgenetic alopecia has been taking finasteride  and topical minoxidil  but not consistently, hx of isks Skin tag at right eyelid some spots at left temple and other spots on face that get irritated.    The patient presents for Upper Body Skin Exam (UBSE) for skin cancer screening and mole check. The patient has spots, moles and lesions to be evaluated, some may be new or changing and the patient may have concern these could be cancer.    The following portions of the chart were reviewed this encounter and updated as appropriate: medications, allergies, medical history  Review of Systems:  No other skin or systemic complaints except as noted in HPI or Assessment and Plan.  Objective  Well appearing patient in no apparent distress; mood and affect are within normal limits.  All skin waist up examined. Relevant physical exam findings are noted in the Assessment and Plan.  right upper eyelid x 1, upper left eyebrow x 4 (5) Small waxy papule at right upper eyelid  tan stuck-on, waxy papules with erythema left upper eyebrow  Assessment & Plan   Skin cancer screening performed today.  INFLAMED SEBORRHEIC KERATOSIS (5) right upper eyelid x 1, upper left eyebrow x 4 (5) Symptomatic, irritating, patient would like treated. Destruction of lesion - right upper eyelid x 1, upper left eyebrow x 4 (5)  Destruction method: cryotherapy   Informed consent: discussed and consent obtained   Lesion destroyed using liquid nitrogen: Yes   Region frozen until ice ball extended beyond lesion: Yes   Outcome: patient tolerated procedure well with no complications   Post-procedure details: wound care instructions given   Additional details:  Prior to procedure, discussed risks of blister formation, small wound, skin  dyspigmentation, or rare scar following cryotherapy. Recommend Vaseline ointment to treated areas while healing.   ANDROGENETIC ALOPECIA   Related Medications finasteride  (PROPECIA ) 1 MG tablet Take 1 tablet (1 mg total) by mouth daily. minoxidil  (LONITEN ) 2.5 MG tablet Take (2 tabs) 5 mg by mouth daily  ANDROGENETIC ALOPECIA (MALE PATTERN HAIR LOSS) Exam: Frontal scalp, temporal, and vertex hair thinning with intact frontal hairline and miniaturization.  Some regrowth noted on crown/vertex when compared to baseline photos BP today 140/87, 65 P   Chronic and persistent condition with duration or expected duration over one year. Condition is symptomatic/ bothersome to patient. Not currently at goal on topical Rogaine .     Androgenetic Alopecia (or Male pattern hair loss) refers to the common patterned hair loss affecting many men.  Male pattern alopecia is mediated by dihydrotestosterone which induces miniaturization of androgen-sensitive hair follicles.  It is chronic and persistent, but treatable; not curable. Topical treatment includes: - 5% topical Minoxidil  Oral treatment includes: - Finasteride  1 mg qd - Minoxidil  1.25 - 5 mg qd - Dutasteride 0.5 mg qd Adjunct therapy includes: - Low Level Laser Light Therapy (LLLT) - Platelet-rich Plasma injections (PRP) - Hair Transplantation or scalp reduction   Treatment Plan: Start Minoxidil  2.5 mg  2 tablets daily. Do not use topical minoxidil  if taking oral minoxidil . Continue Finasteride  1 mg take 1 tablet once daily.   Doses of oral minoxidil  for hair loss are considered 'low dose'. This is because the doses used for hair loss are much lower than the doses which are used  for conditions such as high blood pressure (hypertension). The doses used for hypertension are 10-40mg  per day.  Side effects are uncommon at the low doses (up to 2.5 mg/day) used to treat hair loss. Potential side effects, more commonly seen at higher doses,  include: Increase in hair growth (hypertrichosis) elsewhere on face and body Temporary hair shedding upon starting medication which may last up to 4 weeks Ankle swelling, fluid retention, rapid weight gain more than 5 pounds Low blood pressure and feeling lightheaded or dizzy when standing up quickly Fast or irregular heartbeat Headaches   Counseled that finasteride  can decrease libido (sexual drive). Advised it should not be taken by pregnant women or women who could become pregnant. Advised not to donate blood products while taking this medication. Advised if medication is stopped, they may lose the hair the medication has been helping to grow.    Long term medication management.  Patient is using long term (months to years) prescription medication  to control their dermatologic condition.  These medications require periodic monitoring to evaluate for efficacy and side effects and may require periodic laboratory monitoring.     Actinic Damage - Chronic condition, secondary to cumulative UV/sun exposure - diffuse scaly erythematous macules with underlying dyspigmentation - Recommend daily broad spectrum sunscreen SPF 30+ to sun-exposed areas, reapply every 2 hours as needed.  - Staying in the shade or wearing long sleeves, sun glasses (UVA+UVB protection) and wide brim hats (4-inch brim around the entire circumference of the hat) are also recommended for sun protection.  - Call for new or changing lesions.  Lentigines, Seborrheic Keratoses, Hemangiomas - Benign normal skin lesions - Benign-appearing - Call for any changes  Melanocytic Nevi Multiple moles at back, abdomen, and arms - 2.5  mm medium brown macule at right anterior thigh  - Tan-brown and/or pink-flesh-colored symmetric macules and papules - Benign appearing on exam today - Observation - Call clinic for new or changing moles - Recommend daily use of broad spectrum spf 30+ sunscreen to sun-exposed areas.    TELANGIECTASIA Exam: dilated blood vessel at glabella, blanches  Treatment Plan: Benign appearing on exam Call for changes Discussed BBL treatment to help fade/remove Not covered by insurance for 1 spot is $200   Counseling for BBL / IPL / Laser and Coordination of Care Discussed the treatment option of Broad Band Light (BBL) /Intense Pulsed Light (IPL)/ Laser for skin discoloration, including brown spots and redness.  Typically we recommend at least 1-3 treatment sessions about 5-8 weeks apart for best results.  Cannot have tanned skin when BBL performed, and regular use of sunscreen/photoprotection is advised after the procedure to help maintain results. The patient's condition may also require maintenance treatments in the future.  The fee for BBL / laser treatments is $350 per treatment session for the whole face.  A fee can be quoted for other parts of the body.  Insurance typically does not pay for BBL/laser treatments and therefore the fee is an out-of-pocket cost. Recommend prophylactic valtrex treatment. Once scheduled for procedure, will send Rx in prior to patient's appointment.    HISTORY OF MELANOMA. Left pectoral. 1995. - No evidence of recurrence today - Recommend regular full body skin exams - Recommend daily broad spectrum sunscreen SPF 30+ to sun-exposed areas, reapply every 2 hours as needed.  - Call if any new or changing lesions are noted between office visits   HISTORY OF DYSPLASTIC NEVUS.  04/09/2015 left post lateral deltoid, mild  Multiple sites, see  history.  No evidence of recurrence today Recommend regular full body skin exams Recommend daily broad spectrum sunscreen SPF 30+ to sun-exposed areas, reapply every 2 hours as needed.  Call if any new or changing lesions are noted between office visits   Healing Blister Exam: Scaly dark gray macule right plantar foot, pt just noticed, he runs  Benign-appearing.  Observation.  RTC if doesn't clear. Call clinic  for new or changing lesions.  Recommend daily use of broad spectrum spf 30+ sunscreen to sun-exposed areas.      Return in about 1 year (around 01/20/2025) for TBSE .  I, Eleanor Blush, CMA, am acting as scribe for Rexene Rattler, MD.   Documentation: I have reviewed the above documentation for accuracy and completeness, and I agree with the above.  Rexene Rattler, MD

## 2024-01-21 NOTE — Patient Instructions (Addendum)
 For broken blood vessel at forehead  For treatment of 1 spot $200 Can be scheduled at any time  Counseling for BBL / IPL / Laser and Coordination of Care Discussed the treatment option of Broad Band Light (BBL) /Intense Pulsed Light (IPL)/ Laser for skin discoloration, including brown spots and redness.  Typically we recommend at least 1-3 treatment sessions about 5-8 weeks apart for best results.  Cannot have tanned skin when BBL performed, and regular use of sunscreen/photoprotection is advised after the procedure to help maintain results. The patient's condition may also require maintenance treatments in the future.  The fee for BBL / laser treatments is $350 per treatment session for the whole face.  A fee can be quoted for other parts of the body.  Insurance typically does not pay for BBL/laser treatments and therefore the fee is an out-of-pocket cost. Recommend prophylactic valtrex treatment. Once scheduled for procedure, will send Rx in prior to patient's appointment.    Androgenetic Alopecia (or Male pattern hair loss) refers to the common patterned hair loss affecting many men.  Male pattern alopecia is mediated by dihydrotestosterone which induces miniaturization of androgen-sensitive hair follicles.  It is chronic and persistent, but treatable; not curable. Topical treatment includes: - 5% topical Minoxidil  Oral treatment includes: - Finasteride  1 mg qd - Minoxidil  1.25 - 5 mg qd - Dutasteride 0.5 mg qd Adjunct therapy includes: - Low Level Laser Light Therapy (LLLT) - Platelet-rich Plasma injections (PRP) - Hair Transplantation or scalp reduction  Continue minoxidil  2.5 - take 2 tabs by mouth daily   Doses of oral minoxidil  for hair loss are considered 'low dose'. This is because the doses used for hair loss are much lower than the doses which are used for conditions such as high blood pressure (hypertension). The doses used for hypertension are 10-40mg  per day.  Side effects are  uncommon at the low doses (up to 2.5 mg/day) used to treat hair loss. Potential side effects, more commonly seen at higher doses, include: Increase in hair growth (hypertrichosis) elsewhere on face and body Temporary hair shedding upon starting medication which may last up to 4 weeks Ankle swelling, fluid retention, rapid weight gain more than 5 pounds Low blood pressure and feeling lightheaded or dizzy when standing up quickly Fast or irregular heartbeat Headaches  Continue Finasteride  1 mg tab - take 1 tab by mouth daily       Seborrheic Keratosis  What causes seborrheic keratoses? Seborrheic keratoses are harmless, common skin growths that first appear during adult life.  As time goes by, more growths appear.  Some people may develop a large number of them.  Seborrheic keratoses appear on both covered and uncovered body parts.  They are not caused by sunlight.  The tendency to develop seborrheic keratoses can be inherited.  They vary in color from skin-colored to gray, brown, or even black.  They can be either smooth or have a rough, warty surface.   Seborrheic keratoses are superficial and look as if they were stuck on the skin.  Under the microscope this type of keratosis looks like layers upon layers of skin.  That is why at times the top layer may seem to fall off, but the rest of the growth remains and re-grows.    Treatment Seborrheic keratoses do not need to be treated, but can easily be removed in the office.  Seborrheic keratoses often cause symptoms when they rub on clothing or jewelry.  Lesions can be in the way  of shaving.  If they become inflamed, they can cause itching, soreness, or burning.  Removal of a seborrheic keratosis can be accomplished by freezing, burning, or surgery. If any spot bleeds, scabs, or grows rapidly, please return to have it checked, as these can be an indication of a skin cancer.  Cryotherapy Aftercare  Wash gently with soap and water everyday.    Apply Vaseline and Band-Aid daily until healed.    Melanoma ABCDEs  Melanoma is the most dangerous type of skin cancer, and is the leading cause of death from skin disease.  You are more likely to develop melanoma if you: Have light-colored skin, light-colored eyes, or red or blond hair Spend a lot of time in the sun Tan regularly, either outdoors or in a tanning bed Have had blistering sunburns, especially during childhood Have a close family member who has had a melanoma Have atypical moles or large birthmarks  Early detection of melanoma is key since treatment is typically straightforward and cure rates are extremely high if we catch it early.   The first sign of melanoma is often a change in a mole or a new dark spot.  The ABCDE system is a way of remembering the signs of melanoma.  A for asymmetry:  The two halves do not match. B for border:  The edges of the growth are irregular. C for color:  A mixture of colors are present instead of an even brown color. D for diameter:  Melanomas are usually (but not always) greater than 6mm - the size of a pencil eraser. E for evolution:  The spot keeps changing in size, shape, and color.  Please check your skin once per month between visits. You can use a small mirror in front and a large mirror behind you to keep an eye on the back side or your body.   If you see any new or changing lesions before your next follow-up, please call to schedule a visit.  Please continue daily skin protection including broad spectrum sunscreen SPF 30+ to sun-exposed areas, reapplying every 2 hours as needed when you're outdoors.   Staying in the shade or wearing long sleeves, sun glasses (UVA+UVB protection) and wide brim hats (4-inch brim around the entire circumference of the hat) are also recommended for sun protection.    Due to recent changes in healthcare laws, you may see results of your pathology and/or laboratory studies on MyChart before the doctors  have had a chance to review them. We understand that in some cases there may be results that are confusing or concerning to you. Please understand that not all results are received at the same time and often the doctors may need to interpret multiple results in order to provide you with the best plan of care or course of treatment. Therefore, we ask that you please give us  2 business days to thoroughly review all your results before contacting the office for clarification. Should we see a critical lab result, you will be contacted sooner.   If You Need Anything After Your Visit  If you have any questions or concerns for your doctor, please call our main line at [phone] and press option 4 to reach your doctor's medical assistant. If no one answers, please leave a voicemail as directed and we will return your call as soon as possible. Messages left after 4 pm will be answered the following business day.   You may also send us  a message via MyChart. We typically  respond to MyChart messages within 1-2 business days.  For prescription refills, please ask your pharmacy to contact our office. Our fax number is [phone].  If you have an urgent issue when the clinic is closed that cannot wait until the next business day, you can page your doctor at the number below.    Please note that while we do our best to be available for urgent issues outside of office hours, we are not available 24/7.   If you have an urgent issue and are unable to reach us , you may choose to seek medical care at your doctor's office, retail clinic, urgent care center, or emergency room.  If you have a medical emergency, please immediately call 911 or go to the emergency department.  Pager Numbers  - Dr. Hester: [phone]  - Dr. Jackquline: [phone]  - Dr. Claudene: [phone]   In the event of inclement weather, please call our main line at [phone] for an update on the status of any delays or  closures.  Dermatology Medication Tips: Please keep the boxes that topical medications come in in order to help keep track of the instructions about where and how to use these. Pharmacies typically print the medication instructions only on the boxes and not directly on the medication tubes.   If your medication is too expensive, please contact our office at [phone] option 4 or send us  a message through MyChart.   We are unable to tell what your co-pay for medications will be in advance as this is different depending on your insurance coverage. However, we may be able to find a substitute medication at lower cost or fill out paperwork to get insurance to cover a needed medication.   If a prior authorization is required to get your medication covered by your insurance company, please allow us  1-2 business days to complete this process.  Drug prices often vary depending on where the prescription is filled and some pharmacies may offer cheaper prices.  The website www.goodrx.com contains coupons for medications through different pharmacies. The prices here do not account for what the cost may be with help from insurance (it may be cheaper with your insurance), but the website can give you the price if you did not use any insurance.  - You can print the associated coupon and take it with your prescription to the pharmacy.  - You may also stop by our office during regular business hours and pick up a GoodRx coupon card.  - If you need your prescription sent electronically to a different pharmacy, notify our office through [facility] or by phone at [phone] option 4.     Si Usted Necesita Algo Despus de Su Visita  Tambin puede enviarnos un mensaje a travs de Clinical cytogeneticist. Por lo general respondemos a los mensajes de MyChart en el transcurso de 1 a 2 das hbiles.  Para renovar recetas, por favor pida a su farmacia que se ponga en contacto con nuestra oficina. Randi lakes de fax  es [place] [phone].  Si tiene un asunto urgente cuando la clnica est cerrada y que no puede esperar hasta el siguiente da hbil, puede llamar/localizar a su doctor(a) al nmero que aparece a continuacin.   Por favor, tenga en cuenta que aunque hacemos todo lo posible para estar disponibles para asuntos urgentes fuera del horario de [place], no estamos disponibles las 24 horas del da, los 7 [address]  [address] de la [place].   Si tiene un problema urgente y no puede comunicarse  con nosotros, puede optar por buscar atencin mdica  en el consultorio de su doctor(a), en una clnica privada, en un centro de atencin urgente o en una sala de emergencias.  Si tiene Engineer, drilling, por favor llame inmediatamente al 911 o vaya a la sala de emergencias.  Nmeros de bper  - Dr. Hester: [phone]  - Dra. Jackquline: [phone]  - Dr. Claudene: [phone]   En caso de inclemencias del tiempo, por favor llame a landry capes principal al [phone] para una actualizacin sobre el [place] de cualquier retraso o cierre.  Consejos para la medicacin en dermatologa: Por favor, guarde las cajas en las que vienen los medicamentos de uso tpico para ayudarle a seguir las instrucciones sobre dnde y cmo usarlos. Las farmacias generalmente imprimen las instrucciones del medicamento slo en las cajas y no directamente en los tubos del [place].   Si su medicamento es muy caro, por favor, pngase en contacto con landry rieger llamando al [phone] y presione la opcin 4 o envenos un mensaje a travs de Clinical cytogeneticist.   No podemos decirle cul ser su copago por los medicamentos por adelantado ya que esto es diferente dependiendo de la cobertura de su seguro. Sin embargo, es posible que podamos encontrar un medicamento sustituto a Audiological scientist un formulario para que el seguro cubra el medicamento que se considera necesario.   Si se requiere una autorizacin previa para que su compaa de seguros  malta su medicamento, por favor permtanos de 1 a 2 das hbiles para completar este proceso.  Los precios de los medicamentos varan con frecuencia dependiendo del Environmental consultant de dnde se surte la receta y alguna farmacias pueden ofrecer precios ms baratos.  El sitio web www.goodrx.com tiene cupones para medicamentos de Health and safety inspector. Los precios aqu no tienen en cuenta lo que podra costar con la ayuda del seguro (puede ser ms barato con su seguro), pero el sitio web puede darle el precio si no utiliz Tourist information centre manager.  - Puede imprimir el cupn correspondiente y llevarlo con su receta a la farmacia.  - Tambin puede pasar por nuestra oficina durante el horario de atencin regular y Education officer, museum una tarjeta de cupones de GoodRx.  - Si necesita que su receta se enve electrnicamente a una farmacia diferente, informe a nuestra oficina a travs de MyChart de [place] o por telfono llamando al [phone] y presione la opcin 4.

## 2024-03-09 DIAGNOSIS — S52502A Unspecified fracture of the lower end of left radius, initial encounter for closed fracture: Secondary | ICD-10-CM | POA: Diagnosis not present

## 2024-03-17 DIAGNOSIS — S52502A Unspecified fracture of the lower end of left radius, initial encounter for closed fracture: Secondary | ICD-10-CM | POA: Diagnosis not present

## 2024-03-17 DIAGNOSIS — S52572D Other intraarticular fracture of lower end of left radius, subsequent encounter for closed fracture with routine healing: Secondary | ICD-10-CM | POA: Diagnosis not present

## 2024-03-21 ENCOUNTER — Encounter: Payer: Self-pay | Admitting: Internal Medicine

## 2024-03-21 ENCOUNTER — Ambulatory Visit: Admitting: Internal Medicine

## 2024-03-21 VITALS — BP 128/80 | HR 64 | Temp 98.1°F | Ht 76.0 in | Wt 201.8 lb

## 2024-03-21 DIAGNOSIS — E559 Vitamin D deficiency, unspecified: Secondary | ICD-10-CM

## 2024-03-21 DIAGNOSIS — S52502D Unspecified fracture of the lower end of left radius, subsequent encounter for closed fracture with routine healing: Secondary | ICD-10-CM

## 2024-03-21 DIAGNOSIS — N401 Enlarged prostate with lower urinary tract symptoms: Secondary | ICD-10-CM

## 2024-03-21 DIAGNOSIS — Z125 Encounter for screening for malignant neoplasm of prostate: Secondary | ICD-10-CM | POA: Diagnosis not present

## 2024-03-21 DIAGNOSIS — Z13220 Encounter for screening for lipoid disorders: Secondary | ICD-10-CM | POA: Diagnosis not present

## 2024-03-21 DIAGNOSIS — S5290XA Unspecified fracture of unspecified forearm, initial encounter for closed fracture: Secondary | ICD-10-CM | POA: Insufficient documentation

## 2024-03-21 DIAGNOSIS — N138 Other obstructive and reflux uropathy: Secondary | ICD-10-CM

## 2024-03-21 DIAGNOSIS — I77810 Thoracic aortic ectasia: Secondary | ICD-10-CM

## 2024-03-21 DIAGNOSIS — Z0000 Encounter for general adult medical examination without abnormal findings: Secondary | ICD-10-CM | POA: Diagnosis not present

## 2024-03-21 NOTE — Assessment & Plan Note (Addendum)
 Physical today 03/21/24. Colonoscopy 01/2016 as outlined.  Recommended f/u in 01/2021.  Colonoscopy 07/12/21 - tubular adenoma.  Recommended f/u colonoscopy in 5 years.  Check psa today.

## 2024-03-21 NOTE — Progress Notes (Signed)
 Subjective:    Patient ID: Paul Pruitt, male    DOB: 1965-08-29, 58 y.o.   MRN: [id number]  Patient here for  Chief Complaint  Patient presents with   Annual Exam    HPI Here for a physical exam. Being followed by ortho for close fracture of the distal end of the left radius. Placed in splint. Elevation. F/u 03/17/24 - arm cast placed. Referred to Dr Camella. Still trying to stay active. No chest pain or sob with increased activity or exertion. No abdominal pain or bowel change reported. Has a past history of BPH with urinary obstruction s/p urolift. States urolift - did not help - no change. Increased stress - work. Desires no further intervention.    Past Medical History:  Diagnosis Date   Aortic root dilatation    4.3 cm on echo in 2022   B12 deficiency    BPH with obstruction/lower urinary tract symptoms    Bradycardia    pt is a runner and very active with [employer]   Chronic left-sided lumbar radiculopathy    COVID-19    Dysplastic nevus 04/09/2015   left post lateral deltoid, mild atypia    Dysplastic nevus 05/17/2014   right prox med thigh, mild atypia    Dysplastic nevus 08/22/2008   right post shoulder, slight atypia    Dysplastic nevus 01/04/2007   R sup post lat thigh, slight atypia    Dysplastic nevus 09/15/2006   left post waistline paraspinal, severe atypia    Dysplastic nevus 07/09/2006   Left lower back, slight atypia    Dysplastic nevus 07/09/2006   right ear, moderate atypia    Dyspnea    Heart palpitations    Lymphadenopathy    Melanoma (HCC) 1995   L pectoral    PVC (premature ventricular contraction)    Scoliosis    Thrombocytopenia    Vitamin D  deficiency    Past Surgical History:  Procedure Laterality Date   BACK SURGERY N/A 2004, 2005   L3, 4 and L4,5   CATARACT EXTRACTION, BILATERAL     08/12/16 and 08/19/16.   COLONOSCOPY  02/07/2016   By dr Laquita polyps, internal hemorrhoids, waiting path report   CYSTOSCOPY WITH INSERTION OF  UROLIFT N/A 11/03/2022   Procedure: CYSTOSCOPY WITH INSERTION OF UROLIFT;  Surgeon: Penne Knee, MD;  Location: [facility] ORS;  Service: Urology;  Laterality: N/A;   HERNIA REPAIR     LAMINECTOMY     x 2   MELANOMA EXCISION     chest area   TONSILLECTOMY     Family History  Problem Relation Age of Onset   Arthritis Mother    Colon cancer Mother    Hypertension Father    Asthma Father    Hypertension Brother    Obesity Brother    Asthma Son    Obesity Son    Kidney disease Neg Hx    Prostate cancer Neg Hx    Social History   Socioeconomic History   Marital status: Married    Spouse name: Not on file   Number of children: Not on file   Years of education: Not on file   Highest education level: Master's degree (e.g., MA, MS, MEng, MEd, MSW, MBA)  Occupational History   Not on file  Tobacco Use   Smoking status: Never    Passive exposure: Never   Smokeless tobacco: Never  Vaping Use   Vaping status: Never Used  Substance and Sexual Activity   Alcohol  use: Not Currently    Comment: seldom-8 drinks a year maybe   Drug use: No   Sexual activity: Yes    Birth control/protection: None  Other Topics Concern   Not on file  Social History Narrative   Patient states heavy exercise. 7 days per week x 30-90 minutes.   Social Drivers of Corporate investment banker Strain: Low Risk  (03/17/2024)   Overall Financial Resource Strain (CARDIA)    Difficulty of Paying Living Expenses: Not hard at all  Food Insecurity: Unknown (03/17/2024)   Hunger Vital Sign    Worried About Running Out of Food in the Last Year: Not on file    Ran Out of Food in the Last Year: Never true  Transportation Needs: No Transportation Needs (03/17/2024)   PRAPARE - Administrator, Civil Service (Medical): No    Lack of Transportation (Non-Medical): No  Physical Activity: Sufficiently Active (03/17/2024)   Exercise Vital Sign    Days of Exercise per Week: 7 days    Minutes of Exercise per  Session: 50 min  Stress: Stress Concern Present (03/17/2024)   [employer] of Occupational Health - Occupational Stress Questionnaire    Feeling of Stress: To some extent  Social Connections: Socially Integrated (03/17/2024)   Social Connection and Isolation Panel    Frequency of Communication with Friends and Family: More than three times a week    Frequency of Social Gatherings with Friends and Family: Once a week    Attends Religious Services: 1 to 4 times per year    Active Member of [employer] or Organizations: Yes    Attends Banker Meetings: 1 to 4 times per year    Marital Status: Married     Review of Systems  Constitutional:  Negative for appetite change and unexpected weight change.  HENT:  Negative for congestion, sinus pressure and sore throat.   Eyes:  Negative for pain and visual disturbance.  Respiratory:  Negative for cough, chest tightness and shortness of breath.   Cardiovascular:  Negative for chest pain, palpitations and leg swelling.  Gastrointestinal:  Negative for abdominal pain, diarrhea, nausea and vomiting.  Genitourinary:  Negative for difficulty urinating and dysuria.  Musculoskeletal:  Negative for joint swelling and myalgias.       S/p wrist fracture as outlined.   Skin:  Negative for color change and rash.  Neurological:  Negative for dizziness and headaches.  Hematological:  Negative for adenopathy. Does not bruise/bleed easily.  Psychiatric/Behavioral:  Negative for agitation and dysphoric mood.        Objective:     BP 128/80   Pulse 64   Temp 98.1 F (36.7 C)   Ht 6' 4 (1.93 m)   Wt 201 lb 12.8 oz (91.5 kg)   SpO2 97%   BMI 24.56 kg/m  Wt Readings from Last 3 Encounters:  03/21/24 201 lb 12.8 oz (91.5 kg)  12/21/23 200 lb (90.7 kg)  01/22/23 198 lb 6.4 oz (90 kg)    Physical Exam Constitutional:      General: He is not in acute distress.    Appearance: Normal appearance. He is well-developed.  HENT:     Head:  Normocephalic and atraumatic.     Right Ear: External ear normal.     Left Ear: External ear normal.     Mouth/Throat:     Pharynx: No oropharyngeal exudate or posterior oropharyngeal erythema.  Eyes:     General: No scleral icterus.  Right eye: No discharge.        Left eye: No discharge.     Conjunctiva/sclera: Conjunctivae normal.  Neck:     Thyroid : No thyromegaly.  Cardiovascular:     Rate and Rhythm: Normal rate and regular rhythm.  Pulmonary:     Effort: No respiratory distress.     Breath sounds: Normal breath sounds. No wheezing.  Abdominal:     General: Bowel sounds are normal.     Palpations: Abdomen is soft.     Tenderness: There is no abdominal tenderness.  Musculoskeletal:        General: No swelling or tenderness.     Cervical back: Neck supple. No tenderness.  Lymphadenopathy:     Cervical: No cervical adenopathy.  Skin:    Findings: No erythema or rash.  Neurological:     Mental Status: He is alert and oriented to person, place, and time.  Psychiatric:        Mood and Affect: Mood normal.        Behavior: Behavior normal.         Outpatient Encounter Medications as of 03/21/2024  Medication Sig   cholecalciferol (VITAMIN D3) 25 MCG (1000 UNIT) tablet Take 1,000 Units by mouth daily.   cyanocobalamin  1000 MCG tablet Take 1,000 mcg by mouth daily.   finasteride  (PROPECIA ) 1 MG tablet Take 1 tablet (1 mg total) by mouth daily.   ibuprofen  (ADVIL ) 800 MG tablet Take 1 tablet (800 mg total) by mouth 2 (two) times daily as needed.   minoxidil  (LONITEN ) 2.5 MG tablet Take (2 tabs) 5 mg by mouth daily   [DISCONTINUED] benzonatate (TESSALON) 200 MG capsule Take 200 mg by mouth.   [DISCONTINUED] guaiFENesin (MUCINEX) 600 MG 12 hr tablet Take by mouth 2 (two) times daily.   [DISCONTINUED] Multiple Vitamin (MULTIVITAMIN) tablet Take 1 tablet by mouth daily. (Patient not taking: Reported on 12/21/2023)   [DISCONTINUED] predniSONE  (DELTASONE ) 20 MG tablet 20 mg.    No facility-administered encounter medications on file as of 03/21/2024.     Lab Results  Component Value Date   WBC 5.9 03/21/2024   HGB 14.2 03/21/2024   HCT 41.1 03/21/2024   PLT 201.0 03/21/2024   GLUCOSE 85 03/21/2024   CHOL 159 03/21/2024   TRIG 89.0 03/21/2024   HDL 60.90 03/21/2024   LDLCALC 80 03/21/2024   ALT 14 03/21/2024   AST 16 03/21/2024   NA 140 03/21/2024   K 3.7 03/21/2024   CL 102 03/21/2024   CREATININE 0.79 03/21/2024   BUN 35 (H) 03/21/2024   CO2 30 03/21/2024   TSH 3.67 03/21/2024   PSA 0.45 03/21/2024    DG Chest 2 View Result Date: 03/13/2023 CLINICAL DATA:  Shortness of breath for several years. EXAM: CHEST - 2 VIEW COMPARISON:  June 26, 2020. FINDINGS: The heart size and mediastinal contours are within normal limits. Both lungs are clear. Hyperinflation of the lungs is noted. The visualized skeletal structures are unremarkable. IMPRESSION: No active cardiopulmonary disease. Hyperinflation of the lungs is noted. Electronically Signed   By: Lynwood Landy Raddle M.D.   On: 03/13/2023 12:38       Assessment & Plan:  Routine general medical examination at a health care facility  Healthcare maintenance Assessment & Plan: Physical today 03/21/24. Colonoscopy 01/2016 as outlined.  Recommended f/u in 01/2021.  Colonoscopy 07/12/21 - tubular adenoma.  Recommended f/u colonoscopy in 5 years.  Check psa today.    Vitamin D  deficiency Assessment & Plan:  Check vitamin D  level with next labs.   Orders: -     TSH -     VITAMIN D  25 Hydroxy (Vit-D Deficiency, Fractures)  Prostate cancer screening -     PSA  Closed fracture of distal end of left radius with routine healing, unspecified fracture morphology, subsequent encounter -     CBC with Differential/Platelet -     Basic metabolic panel with GFR -     Hepatic function panel  Screening cholesterol level -     Lipid panel  Aortic root dilatation Assessment & Plan: 2D echocardiogram was performed  07/26/2020 which revealed normal left ventricular function, with LVEF greater than 55%, with trivial valvular insufficiencies, and mildly dilated aortic root of 4.3 cm. Recommended CT scan to evaluate aortic root. F/u CT scan 12/17/22 - 5.2 cm aortic sinus aneurysm. Remainder of thoracic aorta normal in caliber.  Referred to Dr Vinita. Evaluated 02/2023 - recommended genetic testing and f/u scanning in 6 months.  F/u 06/05/23 - f/u CT chest/abd/pelvis - aortic root dilatation (4.9cm). recommend genetic testing and f/u one year.   BPH with obstruction/lower urinary tract symptoms Assessment & Plan: Dr Penne 09/2022 - planning for urolift S/p urolift with 6 implants (11/03/22) - Dr Penne.  Follow up 12/2022 (Dr Penne) - BPH with urinary frequency. Status post Uro lift. Had trial of gemtesa. Can not tell a difference with gemtesa.  Currently on finasteride . Follow.       Allena Hamilton, MD

## 2024-03-22 LAB — CBC WITH DIFFERENTIAL/PLATELET
Basophils Absolute: 0 K/uL (ref 0.0–0.1)
Basophils Relative: 0.6 % (ref 0.0–3.0)
Eosinophils Absolute: 0.1 K/uL (ref 0.0–0.7)
Eosinophils Relative: 1 % (ref 0.0–5.0)
HCT: 41.1 % (ref 39.0–52.0)
Hemoglobin: 14.2 g/dL (ref 13.0–17.0)
Lymphocytes Relative: 24 % (ref 12.0–46.0)
Lymphs Abs: 1.4 K/uL (ref 0.7–4.0)
MCHC: 34.5 g/dL (ref 30.0–36.0)
MCV: 87.1 fl (ref 78.0–100.0)
Monocytes Absolute: 0.4 K/uL (ref 0.1–1.0)
Monocytes Relative: 7.1 % (ref 3.0–12.0)
Neutro Abs: 4 K/uL (ref 1.4–7.7)
Neutrophils Relative %: 67.3 % (ref 43.0–77.0)
Platelets: 201 K/uL (ref 150.0–400.0)
RBC: 4.72 Mil/uL (ref 4.22–5.81)
RDW: 12.5 % (ref 11.5–15.5)
WBC: 5.9 K/uL (ref 4.0–10.5)

## 2024-03-22 LAB — LIPID PANEL
Cholesterol: 159 mg/dL (ref 0–200)
HDL: 60.9 mg/dL (ref >39.00)
LDL Cholesterol: 80 mg/dL (ref 0–99)
NonHDL: 98.19
Total CHOL/HDL Ratio: 3
Triglycerides: 89 mg/dL (ref 0.0–149.0)
VLDL: 17.8 mg/dL (ref 0.0–40.0)

## 2024-03-22 LAB — HEPATIC FUNCTION PANEL
ALT: 14 U/L (ref 0–53)
AST: 16 U/L (ref 0–37)
Albumin: 4.6 g/dL (ref 3.5–5.2)
Alkaline Phosphatase: 59 U/L (ref 39–117)
Bilirubin, Direct: 0.2 mg/dL (ref 0.0–0.3)
Total Bilirubin: 0.9 mg/dL (ref 0.2–1.2)
Total Protein: 6.6 g/dL (ref 6.0–8.3)

## 2024-03-22 LAB — BASIC METABOLIC PANEL WITH GFR
BUN: 35 mg/dL — ABNORMAL HIGH (ref 6–23)
CO2: 30 meq/L (ref 19–32)
Calcium: 9.6 mg/dL (ref 8.4–10.5)
Chloride: 102 meq/L (ref 96–112)
Creatinine, Ser: 0.79 mg/dL (ref 0.40–1.50)
GFR: 98.93 mL/min (ref >60.00)
Glucose, Bld: 85 mg/dL (ref 70–99)
Potassium: 3.7 meq/L (ref 3.5–5.1)
Sodium: 140 meq/L (ref 135–145)

## 2024-03-22 LAB — VITAMIN D 25 HYDROXY (VIT D DEFICIENCY, FRACTURES): VITD: 31.3 ng/mL (ref 30.00–100.00)

## 2024-03-22 LAB — PSA: PSA: 0.45 ng/mL (ref 0.10–4.00)

## 2024-03-22 LAB — TSH: TSH: 3.67 u[IU]/mL (ref 0.35–5.50)

## 2024-03-23 ENCOUNTER — Ambulatory Visit: Payer: Self-pay | Admitting: Internal Medicine

## 2024-03-23 NOTE — Telephone Encounter (Signed)
 Called and discussed labs. Instructed to make sure to stay hydrated. Drinking more water.

## 2024-03-27 ENCOUNTER — Encounter: Payer: Self-pay | Admitting: Internal Medicine

## 2024-03-27 NOTE — Assessment & Plan Note (Signed)
 Dr Penne 09/2022 - planning for urolift S/p urolift with 6 implants (11/03/22) - Dr Penne.  Follow up 12/2022 (Dr Penne) - BPH with urinary frequency. Status post Uro lift. Had trial of gemtesa. Can not tell a difference with gemtesa.  Currently on finasteride . Follow.

## 2024-03-27 NOTE — Assessment & Plan Note (Signed)
 2D echocardiogram was performed 07/26/2020 which revealed normal left ventricular function, with LVEF greater than 55%, with trivial valvular insufficiencies, and mildly dilated aortic root of 4.3 cm. Recommended CT scan to evaluate aortic root. F/u CT scan 12/17/22 - 5.2 cm aortic sinus aneurysm. Remainder of thoracic aorta normal in caliber.  Referred to Dr Vinita. Evaluated 02/2023 - recommended genetic testing and f/u scanning in 6 months.  F/u 06/05/23 - f/u CT chest/abd/pelvis - aortic root dilatation (4.9cm). recommend genetic testing and f/u one year.

## 2024-03-27 NOTE — Assessment & Plan Note (Signed)
 Check vitamin D level with next labs.  ?

## 2024-03-28 ENCOUNTER — Ambulatory Visit
Admission: RE | Admit: 2024-03-28 | Discharge: 2024-03-28 | Disposition: A | Discharge status: Home / Self Care | Source: Ambulatory Visit | Attending: Orthopedic Surgery | Admitting: Orthopedic Surgery

## 2024-03-28 ENCOUNTER — Other Ambulatory Visit: Payer: Self-pay | Admitting: Orthopedic Surgery

## 2024-03-28 DIAGNOSIS — S52592D Other fractures of lower end of left radius, subsequent encounter for closed fracture with routine healing: Secondary | ICD-10-CM | POA: Diagnosis not present

## 2024-03-28 DIAGNOSIS — S52572D Other intraarticular fracture of lower end of left radius, subsequent encounter for closed fracture with routine healing: Secondary | ICD-10-CM | POA: Diagnosis not present

## 2024-03-28 DIAGNOSIS — S52592A Other fractures of lower end of left radius, initial encounter for closed fracture: Secondary | ICD-10-CM

## 2024-03-30 ENCOUNTER — Encounter (HOSPITAL_COMMUNITY): Payer: Self-pay | Admitting: Orthopedic Surgery

## 2024-03-30 ENCOUNTER — Other Ambulatory Visit: Payer: Self-pay

## 2024-03-30 NOTE — Anesthesia Preprocedure Evaluation (Signed)
 Anesthesia Evaluation  Patient identified by MRN, date of birth, ID band Patient awake    Reviewed: Allergy & Precautions, H&P , NPO status , Patient's Chart, lab work & pertinent test results  Airway Mallampati: II  TM Distance: >3 FB Neck ROM: Full    Dental no notable dental hx. (+) Teeth Intact, Dental Advisory Given   Pulmonary    Pulmonary exam normal breath sounds clear to auscultation       Cardiovascular + Peripheral Vascular Disease   Rhythm:Regular Rate:Normal  Echo 03/05/2023 ([facility]): NORMAL LEFT VENTRICULAR SYSTOLIC FUNCTION    NORMAL LA PRESSURES WITH NORMAL DIASTOLIC FUNCTION    NORMAL RIGHT VENTRICULAR SYSTOLIC FUNCTION    VALVULAR REGURGITATION: TRIVIAL MR, TRIVIAL TR    NO VALVULAR STENOSIS    DILATED AORTIC ROOT (4.5 cm) AND ASCENDING AORTA (4.3 cm)     Neuro/Psych  Headaches  negative psych ROS   GI/Hepatic Neg liver ROS,,,  Endo/Other  negative endocrine ROS    Renal/GU negative Renal ROS  negative genitourinary   Musculoskeletal   Abdominal   Peds  Hematology negative hematology ROS (+)   Anesthesia Other Findings   Reproductive/Obstetrics negative OB ROS                              Anesthesia Physical Anesthesia Plan  ASA: 2  Anesthesia Plan: MAC and Regional   Post-op Pain Management: Tylenol  PO (pre-op)*   Induction: Intravenous  PONV Risk Score and Plan: 2 and Propofol  infusion, Midazolam  and Ondansetron   Airway Management Planned: Natural Airway and Simple Face Mask  Additional Equipment:   Intra-op Plan:   Post-operative Plan:   Informed Consent: I have reviewed the patients History and Physical, chart, labs and discussed the procedure including the risks, benefits and alternatives for the proposed anesthesia with the patient or authorized representative who has indicated his/her understanding and acceptance.     Dental advisory  given  Plan Discussed with: CRNA  Anesthesia Plan Comments: (TAA is followed by [facility] CT surgeon Dr. Vinita. As of 06/05/2023, Paul Pruitt presents for ongoing aortic surveillance for a known, unrepaired aortic aortic root aneurysm. Aortic imaging today demonstrates stable findings. Based on this, we recommend CTA chest in 12 months. TAA measured 44 x 40 mm then.   Paul Kroeker, PA-C 03/30/2024 1:06 PM    )         Anesthesia Quick Evaluation

## 2024-03-30 NOTE — Progress Notes (Signed)
 SDW call  Patient was given pre-op instructions over the phone. Patient verbalized understanding of instructions provided.     PCP - Dr. Allena Hamilton Cardiologist - Dr. Marsa Dooms, [facility] Pulmonary:    PPM/ICD - denies Device Orders - na Rep Notified - na   Chest x-ray - 12/17/2023, [facility] EKG -  DOS, 03/31/2024 Stress Test -06/19/2020, CE [facility] ECHO - 03/05/2023,[facility] Cardiac Cath -   Sleep Study/sleep apnea/CPAP: denies  Non-diabetic  Blood Thinner Instructions: denies Aspirin Instructions:denies   ERAS Protcol - Clears until 1400   Anesthesia review: Yes. Aortic root dilation, bradycardia, PVC's   Patient denies shortness of breath, fever, cough and chest pain over the phone call  Your procedure is scheduled on Thursday March 31, 2024  Report to [facility] Main Entrance A at  1430  A.M., then check in with the Admitting office.  Call this number if you have problems the morning of surgery:  [phone]   If you have any questions prior to your surgery date call [phone]: Open Monday-Friday 8am-4pm If you experience any cold or flu symptoms such as cough, fever, chills, shortness of breath, etc. between now and your scheduled surgery, please notify us  at the above number    Remember:  Do not eat after midnight the night before your surgery  You may drink clear liquids until  1400   the morning of your surgery.   Clear liquids allowed are: Water, Non-Citrus Juices (without pulp), Carbonated Beverages, Clear Tea, Black Coffee ONLY (NO MILK, CREAM OR POWDERED CREAMER of any kind), and Gatorade   Take these medicines the morning of surgery with A SIP OF WATER:  Finasteride , minoxidil   As needed:   As of today, STOP taking any Aspirin (unless otherwise instructed by your surgeon) Aleve, Naproxen, Ibuprofen , Motrin , Advil , Goody's, BC's, all herbal medications, fish oil, and all vitamins.

## 2024-03-31 ENCOUNTER — Ambulatory Visit (HOSPITAL_COMMUNITY)
Admission: RE | Admit: 2024-03-31 | Discharge: 2024-03-31 | Disposition: A | Discharge status: Home / Self Care | Attending: Orthopedic Surgery | Admitting: Orthopedic Surgery

## 2024-03-31 ENCOUNTER — Ambulatory Visit (HOSPITAL_COMMUNITY): Payer: Self-pay | Admitting: Vascular Surgery

## 2024-03-31 ENCOUNTER — Encounter (HOSPITAL_COMMUNITY)
Admission: RE | Disposition: A | Discharge status: Home / Self Care | Payer: Self-pay | Source: Home / Self Care | Attending: Orthopedic Surgery

## 2024-03-31 ENCOUNTER — Ambulatory Visit (HOSPITAL_COMMUNITY)

## 2024-03-31 DIAGNOSIS — S52572P Other intraarticular fracture of lower end of left radius, subsequent encounter for closed fracture with malunion: Secondary | ICD-10-CM | POA: Insufficient documentation

## 2024-03-31 DIAGNOSIS — X58XXXD Exposure to other specified factors, subsequent encounter: Secondary | ICD-10-CM | POA: Diagnosis not present

## 2024-03-31 DIAGNOSIS — M25532 Pain in left wrist: Secondary | ICD-10-CM | POA: Diagnosis not present

## 2024-03-31 DIAGNOSIS — S6292XP Unspecified fracture of left wrist and hand, subsequent encounter for fracture with malunion: Secondary | ICD-10-CM | POA: Diagnosis not present

## 2024-03-31 DIAGNOSIS — S52591A Other fractures of lower end of right radius, initial encounter for closed fracture: Secondary | ICD-10-CM | POA: Diagnosis not present

## 2024-03-31 DIAGNOSIS — M24032 Loose body in left wrist: Secondary | ICD-10-CM | POA: Diagnosis not present

## 2024-03-31 HISTORY — PX: ORIF WRIST FRACTURE: SHX2133

## 2024-03-31 SURGERY — OPEN REDUCTION INTERNAL FIXATION (ORIF) WRIST FRACTURE
Anesthesia: Monitor Anesthesia Care | Site: Wrist | Laterality: Left

## 2024-03-31 MED ORDER — PROPOFOL 10 MG/ML IV BOLUS
INTRAVENOUS | Status: DC | PRN
  Administered 2024-03-31: 30 mg via INTRAVENOUS
  Administered 2024-03-31: 20 mg via INTRAVENOUS

## 2024-03-31 MED ORDER — MIDAZOLAM HCL 2 MG/2ML IJ SOLN
INTRAMUSCULAR | Status: AC
  Administered 2024-03-31: 2 mg via INTRAVENOUS
  Filled 2024-03-31: qty 2

## 2024-03-31 MED ORDER — BUPIVACAINE-EPINEPHRINE (PF) 0.5% -1:200000 IJ SOLN
INTRAMUSCULAR | Status: DC | PRN
Start: 2024-03-31 — End: 2024-03-31
  Administered 2024-03-31: 30 mL via PERINEURAL

## 2024-03-31 MED ORDER — PROPOFOL 10 MG/ML IV BOLUS
INTRAVENOUS | Status: AC
  Filled 2024-03-31: qty 20

## 2024-03-31 MED ORDER — MIDAZOLAM HCL (PF) 2 MG/2ML IJ SOLN: 2.0000 mg | Freq: Once | INTRAMUSCULAR | Status: AC

## 2024-03-31 MED ORDER — DEXMEDETOMIDINE HCL IN NACL 80 MCG/20ML IV SOLN
INTRAVENOUS | Status: DC | PRN
  Administered 2024-03-31: 8 ug via INTRAVENOUS

## 2024-03-31 MED ORDER — FENTANYL CITRATE (PF) 100 MCG/2ML IJ SOLN
INTRAMUSCULAR | Status: AC
  Administered 2024-03-31: 50 ug via INTRAVENOUS
  Filled 2024-03-31: qty 2

## 2024-03-31 MED ORDER — MIDAZOLAM HCL 2 MG/2ML IJ SOLN
INTRAMUSCULAR | Status: AC
  Filled 2024-03-31: qty 2

## 2024-03-31 MED ORDER — PROPOFOL 500 MG/50ML IV EMUL
INTRAVENOUS | Status: DC | PRN
  Administered 2024-03-31: 150 ug/kg/min via INTRAVENOUS

## 2024-03-31 MED ORDER — HYDROMORPHONE HCL 1 MG/ML IJ SOLN: 0.2500 mg | INTRAMUSCULAR | Status: DC | PRN

## 2024-03-31 MED ORDER — CHLORHEXIDINE GLUCONATE 0.12 % MT SOLN
15.0000 mL | Freq: Once | OROMUCOSAL | Status: AC
  Administered 2024-03-31: 15 mL via OROMUCOSAL
  Filled 2024-03-31: qty 15

## 2024-03-31 MED ORDER — FENTANYL CITRATE (PF) 250 MCG/5ML IJ SOLN
INTRAMUSCULAR | Status: AC
  Filled 2024-03-31: qty 5

## 2024-03-31 MED ORDER — ORAL CARE MOUTH RINSE: 15.0000 mL | Freq: Once | OROMUCOSAL | Status: AC

## 2024-03-31 MED ORDER — FENTANYL CITRATE (PF) 100 MCG/2ML IJ SOLN: 50.0000 ug | Freq: Once | INTRAMUSCULAR | Status: AC

## 2024-03-31 MED ORDER — CEFAZOLIN SODIUM-DEXTROSE 2-4 GM/100ML-% IV SOLN
2.0000 g | INTRAVENOUS | Status: AC
  Administered 2024-03-31: 2 g via INTRAVENOUS
  Filled 2024-03-31: qty 100

## 2024-03-31 MED ORDER — 0.9 % SODIUM CHLORIDE (POUR BTL) OPTIME
TOPICAL | Status: DC | PRN
  Administered 2024-03-31: 1000 mL

## 2024-03-31 MED ORDER — ONDANSETRON HCL 4 MG/2ML IJ SOLN
INTRAMUSCULAR | Status: DC | PRN
  Administered 2024-03-31: 4 mg via INTRAVENOUS

## 2024-03-31 MED ORDER — LACTATED RINGERS IV SOLN: INTRAVENOUS | Status: DC

## 2024-03-31 SURGICAL SUPPLY — 54 items
BAG COUNTER SPONGE SURGICOUNT (BAG) ×1 IMPLANT
BLADE CLIPPER SURG (BLADE) IMPLANT
BNDG COMPR ESMARK 4X3 LF (GAUZE/BANDAGES/DRESSINGS) ×1 IMPLANT
BNDG ELASTIC 3INX 5YD STR LF (GAUZE/BANDAGES/DRESSINGS) ×1 IMPLANT
BNDG ELASTIC 4X5.8 VLCR STR LF (GAUZE/BANDAGES/DRESSINGS) ×1 IMPLANT
BNDG GAUZE DERMACEA FLUFF 4 (GAUZE/BANDAGES/DRESSINGS) ×1 IMPLANT
CANISTER SUCTION 3000ML PPV (SUCTIONS) ×1 IMPLANT
CORD BIPOLAR FORCEPS 12FT (ELECTRODE) ×1 IMPLANT
COVER SURGICAL LIGHT HANDLE (MISCELLANEOUS) ×1 IMPLANT
CUFF TOURN SGL QUICK 18X4 (TOURNIQUET CUFF) ×1 IMPLANT
CUFF TRNQT CYL 24X4X16.5-23 (TOURNIQUET CUFF) IMPLANT
DRAPE OEC MINIVIEW 54X84 (DRAPES) ×1 IMPLANT
DRAPE SURG 17X23 STRL (DRAPES) ×1 IMPLANT
DRILL CANN MAX VPC 3.2MM (DRILL) IMPLANT
DRSG ADAPTIC 3X8 NADH LF (GAUZE/BANDAGES/DRESSINGS) IMPLANT
GAUZE SPONGE 4X4 12PLY STRL (GAUZE/BANDAGES/DRESSINGS) ×1 IMPLANT
GAUZE XEROFORM 1X8 LF (GAUZE/BANDAGES/DRESSINGS) ×1 IMPLANT
GAUZE XEROFORM 5X9 LF (GAUZE/BANDAGES/DRESSINGS) IMPLANT
GLOVE SS BIOGEL STRL SZ 8 (GLOVE) ×1 IMPLANT
GOWN STRL REUS W/ TWL LRG LVL3 (GOWN DISPOSABLE) ×1 IMPLANT
GOWN STRL REUS W/ TWL XL LVL3 (GOWN DISPOSABLE) ×1 IMPLANT
KIT BASIN OR (CUSTOM PROCEDURE TRAY) ×1 IMPLANT
KIT TURNOVER KIT B (KITS) ×1 IMPLANT
KWIRE COCR 1.4 X 127 (WIRE) IMPLANT
LOOP VASCLR MAXI BLUE 18IN ST (MISCELLANEOUS) IMPLANT
MANIFOLD NEPTUNE II (INSTRUMENTS) ×1 IMPLANT
NDL 22X1.5 STRL (OR ONLY) (MISCELLANEOUS) IMPLANT
NEEDLE 22X1.5 STRL (OR ONLY) (MISCELLANEOUS) IMPLANT
PACK ORTHO EXTREMITY (CUSTOM PROCEDURE TRAY) ×1 IMPLANT
PAD ARMBOARD POSITIONER FOAM (MISCELLANEOUS) ×2 IMPLANT
PAD CAST 3X4 CTTN HI CHSV (CAST SUPPLIES) ×1 IMPLANT
PAD CAST 4YDX4 CTTN HI CHSV (CAST SUPPLIES) ×1 IMPLANT
PILLOW ARM CARTER ADULT (MISCELLANEOUS) IMPLANT
SCREW MAX VPC 4.0X36MM (Screw) IMPLANT
SOL PREP POV-IOD 4OZ 10% (MISCELLANEOUS) ×2 IMPLANT
SOLN 0.9% NACL 1000 ML (IV SOLUTION) ×1 IMPLANT
SOLN 0.9% NACL POUR BTL 1000ML (IV SOLUTION) ×1 IMPLANT
SOLN STERILE WATER 1000 ML (IV SOLUTION) ×1 IMPLANT
SOLN STERILE WATER BTL 1000 ML (IV SOLUTION) ×1 IMPLANT
SPLINT FIBERGLASS 4X30 (CAST SUPPLIES) IMPLANT
SPONGE T-LAP 4X18 ~~LOC~~+RFID (SPONGE) IMPLANT
SUT MNCRL AB 4-0 PS2 18 (SUTURE) ×1 IMPLANT
SUT PROLENE 3 0 PS 2 (SUTURE) IMPLANT
SUT PROLENE 4 0 P 3 18 (SUTURE) IMPLANT
SUT PROLENE 4-0 RB1 .5 CRCL 36 (SUTURE) IMPLANT
SUT VIC AB 2-0 CT2 27 (SUTURE) IMPLANT
SUT VIC AB 3-0 FS2 27 (SUTURE) IMPLANT
SYR CONTROL 10ML LL (SYRINGE) IMPLANT
SYSTEM CHEST DRAIN TLS 7FR (DRAIN) IMPLANT
TOWEL GREEN STERILE (TOWEL DISPOSABLE) ×1 IMPLANT
TOWEL GREEN STERILE FF (TOWEL DISPOSABLE) ×1 IMPLANT
TUBE CONNECTING 12X1/4 (SUCTIONS) ×1 IMPLANT
TUBE EVACUATION TLS (MISCELLANEOUS) ×1 IMPLANT
UNDERPAD 30X36 HEAVY ABSORB (UNDERPADS AND DIAPERS) ×1 IMPLANT

## 2024-03-31 NOTE — Anesthesia Procedure Notes (Signed)
 Anesthesia Regional Block: Supraclavicular block   Pre-Anesthetic Checklist: , timeout performed,  Correct Patient, Correct Site, Correct Laterality,  Correct Procedure, Correct Position, site marked,  Risks and benefits discussed,  Pre-op evaluation,  At surgeon's request and post-op pain management  Laterality: Left  Prep: Maximum Sterile Barrier Precautions used, chloraprep       Needles:  Injection technique: Single-shot  Needle Type: Echogenic Stimulator Needle     Needle Length: 5cm  Needle Gauge: 22     Additional Needles:   Procedures:,,,, ultrasound used (permanent image in chart),,    Narrative:  Start time: 03/31/2024 4:41 PM End time: 03/31/2024 4:51 PM Injection made incrementally with aspirations every 5 mL.  Performed by: Personally  Anesthesiologist: Epifanio Fallow, MD

## 2024-03-31 NOTE — Transfer of Care (Signed)
 Immediate Anesthesia Transfer of Care Note  Patient: Paul Pruitt  Procedure(s) Performed: OPEN REDUCTION INTERNAL FIXATION (ORIF) LEFT DISTAL RADIUS FRACTURE (Left: Wrist)  Patient Location: PACU  Anesthesia Type:Regional  Level of Consciousness: awake, alert , and oriented  Airway & Oxygen Therapy: Patient Spontanous Breathing and Patient connected to nasal cannula oxygen  Post-op Assessment: Report given to RN and Post -op Vital signs reviewed and stable  Post vital signs: Reviewed and stable  Last Vitals:  Vitals Value Taken Time  BP 107/66 03/31/24 19:15  Temp    Pulse 48 03/31/24 19:17  Resp 12 03/31/24 19:17  SpO2 99 % 03/31/24 19:17  Vitals shown include unfiled device data.  Last Pain:  Vitals:   03/31/24 1513  TempSrc: Oral         Complications: No notable events documented.

## 2024-03-31 NOTE — Discharge Instructions (Signed)
 It is okay to take ibuprofen  400 mg and Tylenol  500 to 650 mg every 6 hours.  This can be taken in conjunction with the pain medicine.  He is okay to take 2 oxycodone  tablets at a time.  Please call for any problems.  Office will call you for the follow-up.  Maximum elevation motion and massage to the fingers is encouraged.  Keep bandage clean and dry.  Call for any problems.  No smoking.  Criteria for driving a car: you should be off your pain medicine for 7-8 hours, able to drive one handed(confident), thinking clearly and feeling able in your judgement to drive. Continue elevation as it will decrease swelling.  If instructed by MD move your fingers within the confines of the bandage/splint.  Use ice if instructed by your MD. Call immediately for any sudden loss of feeling in your hand/arm or change in functional abilities of the extremity.We recommend that you to take vitamin C 1000 mg a day to promote healing. We also recommend that if you require  pain medicine that you take a stool softener to prevent constipation as most pain medicines will have constipation side effects. We recommend either Peri-Colace or Senokot and recommend that you also consider adding MiraLAX as well to prevent the constipation affects from pain medicine if you are required to use them. These medicines are over the counter and may be purchased at a local pharmacy. A cup of yogurt and a probiotic can also be helpful during the recovery process as the medicines can disrupt your intestinal environment.

## 2024-03-31 NOTE — Op Note (Signed)
 Operative note 03/31/2024.  Elsie Mussel, MD.  Date of dictation and operation March 31, 2024.  Preoperative diagnosis nascent malunion left distal radius with intra-articular fracture displacement and loss of alignment.  Significant pain left wrist.  Loose fragment dorsally and the EPL sheath left wrist  Postop diagnosis the same.  Procedure #1 open reduction internal fixation malunion/nascent malunion left distal radius fracture with headless screw fixation/VPN screw from [employer] #2 EPL decompression and tendon transfer anteriorly. #3 Posterior interosseous nerve neurectomy.#4 Stress radiography left wrist  Surgeon Elsie Mussel  Assistant none  Complication none  Anesthesia Block with IV sedation  Estimated blood loss minimal.  Tourniquet time less than an hour.  Indications for the procedure the patient presents with intra-articular displacement and greater than 3 mm gap intra-articularly.  Unfortunately the timeframe duration from injury is quite substantial and he has a nascent malunion.  I discussed with him risk and benefits and options.  With this in mind he would like to proceed accordingly with surgical procedure.  Operative procedure.  Patient was taken to the operative theater and underwent a smooth induction of IV sedation he was prepped with Hibiclens  scrub followed by Betadine scrub and paint.  Preoperative antibiotics were given in the form of Ancef .  Following this the tourniquet was insufflated after timeout was observed.  The patient was well-padded there were no complicating features with the prep.  The operation commenced with live fluoroscopy evaluating the fracture site followed by insufflation of the tourniquet and an extensile Bruner incision between the 3rd and 4th dorsal compartments.  The CT scan showed large dorsal fragment that was loose and impinging distally on scan review.  He had a large fracture displacement in this region.  I made the initial  exposure and noted the dorsal fragment in the EPL sheath.  The EPL sheath was thus decompressed and fragment removed.  I then mobilized the extensor pollicis longus and transfer this into the anterior soft tissues.  Identified the superficial radial nerve and protected this.  At this time I then performed a posterior interosseous nerve neurectomy with compression and cauterization technique due to chronic pain and problems with his wrist.  Following this we then very carefully and cautiously identify the intra-articular fracture site by making a window with capsular investment and periosteum.  The intra-articular displacement and step-off was quite substantial.  It was greater than 3 mm.  I mobilized this with a freer elevator and house curette and removed devitalized tissue.  I then very carefully and cautiously performed mobilization.  At this time I then placed retractors and reduce the radial styloid fracture to the ulnar column which was quite stable.  Despite a fracture line that went transversely the ulnar column was very stable interoperatively.  The patient had a slight degree of volar tilt loss but I felt this was acceptable.  The main fracture fragment was between the lunate and scaphoid fossa thus it was my plan to reduce this To create better carpal mechanics.  I did reduce this and following reduction made a counterincision at the styloid tip and placed a 36 mm VPN 4  millimeter screw.  I placed this in such a fashion to allow for very gentle compression.  I was very pleased with the stability and there were no and stable findings.  I placed the patient through stress radiography and noted the fracture gap to be nicely closed.  The scaphoid fossa and lunate fossa housed the scaphoid and lunate respectively and there  were no noted instability type patterns.  I then very carefully irrigated and closed the capsule.  Following this the periosteum was closed.  Superficial radial nerve was intact.  EPL  was left transposed.  After irrigation the patient had closure of the skin edges with Prolene.  The patient tolerated this well there were no complicating features.  He was placed in a thumb spica splint.  Will see him back in 2-1/2 weeks remove his sutures cast him and at 4 weeks begin mobilization with therapy.  Laparoscopy 4 views looked excellent and there were no instability type patterns.  It was a pleasure to participate in his care and we look forward to navigating him through his postop care plan.   I discussed all issues with his wife.  They have my cell phone for any issues problems or concerns.  Elsie Mussel, MD

## 2024-03-31 NOTE — H&P (Signed)
 Paul Pruitt is an 58 y.o. male.   Chief Complaint: patient presents for ORIF nascent malunion left wrist HPI: Patient presents for evaluation and treatment of the of their upper extremity predicament. The patient denies neck, back, chest or  abdominal pain. The patient notes that they have no lower extremity problems. The patients primary complaint is noted. We are planning surgical care pathway for the upper extremity.   Past Medical History:  Diagnosis Date   Aortic root dilatation    4.3 cm on echo in 2022   B12 deficiency    BPH with obstruction/lower urinary tract symptoms    Bradycardia    pt is a runner and very active with [employer]   Chronic left-sided lumbar radiculopathy    COVID-19    Dysplastic nevus 04/09/2015   left post lateral deltoid, mild atypia    Dysplastic nevus 05/17/2014   right prox med thigh, mild atypia    Dysplastic nevus 08/22/2008   right post shoulder, slight atypia    Dysplastic nevus 01/04/2007   R sup post lat thigh, slight atypia    Dysplastic nevus 09/15/2006   left post waistline paraspinal, severe atypia    Dysplastic nevus 07/09/2006   Left lower back, slight atypia    Dysplastic nevus 07/09/2006   right ear, moderate atypia    Dyspnea    Heart palpitations    Lymphadenopathy    Melanoma (HCC) 1995   L pectoral    PVC (premature ventricular contraction)    Scoliosis    Thrombocytopenia    Vitamin D  deficiency     Past Surgical History:  Procedure Laterality Date   BACK SURGERY N/A 2004, 2005   L3, 4 and L4,5   CATARACT EXTRACTION, BILATERAL     08/12/16 and 08/19/16.   COLONOSCOPY  02/07/2016   By dr Laquita polyps, internal hemorrhoids, waiting path report   CYSTOSCOPY WITH INSERTION OF UROLIFT N/A 11/03/2022   Procedure: CYSTOSCOPY WITH INSERTION OF UROLIFT;  Surgeon: Penne Knee, MD;  Location: [facility] ORS;  Service: Urology;  Laterality: N/A;   HERNIA REPAIR Bilateral    LAMINECTOMY     x 2   MELANOMA EXCISION     chest  area   TONSILLECTOMY      Family History  Problem Relation Age of Onset   Arthritis Mother    Colon cancer Mother    Hypertension Father    Asthma Father    Hypertension Brother    Obesity Brother    Asthma Son    Obesity Son    Kidney disease Neg Hx    Prostate cancer Neg Hx    Social History:  reports that he has never smoked. He has never been exposed to tobacco smoke. He has never used smokeless tobacco. He reports that he does not currently use alcohol. He reports that he does not use drugs.  Allergies:  Allergies  Allergen Reactions   Quinolones Other (See Comments)    Fluroquinolone antibiotics should be avoided in patients with aortic disease unless alternative therapy is not an option.    No medications prior to admission.    No results found for this or any previous visit (from the past 48 hours). No results found.  Review of Systems  Height 6' 3 (1.905 m), weight 90.7 kg. Physical Exam  Malunion left wrist with intact NV status Plan ORIF The patient is alert and oriented in no acute distress. The patient complains of pain in the affected upper extremity.  The patient is noted to have a normal HEENT exam. Lung fields show equal chest expansion and no shortness of breath. Abdomen exam is nontender without distention. Lower extremity examination does not show any fracture dislocation or blood clot symptoms. Pelvis is stable and the neck and back are stable and nontender.  Assessment/Plan We are planning surgery for your upper extremity. The risk and benefits of surgery to include risk of bleeding, infection, anesthesia,  damage to normal structures and failure of the surgery to accomplish its intended goals of relieving symptoms and restoring function have been discussed in detail. With this in mind we plan to proceed. I have specifically discussed with the patient the pre-and postoperative regime and the dos and don'ts and risk and benefits in great detail.  Risk and benefits of surgery also include risk of dystrophy(CRPS), chronic nerve pain, failure of the healing process to go onto completion and other inherent risks of surgery The relavent the pathophysiology of the disease/injury process, as well as the alternatives for treatment and postoperative course of action has been discussed in great detail with the patient who desires to proceed.  We will do everything in our power to help you (the patient) restore function to the upper extremity. It is a pleasure to see this patient today.   Plan ORIF nascent malunion left wrist  [name], MD 03/31/2024, 6:08 AM

## 2024-04-01 ENCOUNTER — Encounter (HOSPITAL_COMMUNITY): Payer: Self-pay | Admitting: Orthopedic Surgery

## 2024-04-01 NOTE — Anesthesia Postprocedure Evaluation (Signed)
 Anesthesia Post Note  Patient: Paul Pruitt  Procedure(s) Performed: OPEN REDUCTION INTERNAL FIXATION (ORIF) LEFT DISTAL RADIUS FRACTURE (Left: Wrist)     Patient location during evaluation: PACU Anesthesia Type: Regional Level of consciousness: awake and alert Pain management: pain level controlled Vital Signs Assessment: post-procedure vital signs reviewed and stable Respiratory status: spontaneous breathing, nonlabored ventilation, respiratory function stable and patient connected to nasal cannula oxygen Cardiovascular status: stable and blood pressure returned to baseline Postop Assessment: no apparent nausea or vomiting Anesthetic complications: no   No notable events documented.  Last Vitals:  Vitals:   03/31/24 1930 03/31/24 1945  BP: 110/73 110/67  Pulse: (!) 50 (!) 57  Resp: 12 17  Temp:  (!) 36.4 C  SpO2: 97% 99%    Last Pain:  Vitals:   03/31/24 1945  TempSrc:   PainSc: 0-No pain   Pain Goal:                   Garnette DELENA Gab

## 2024-04-18 DIAGNOSIS — Z4789 Encounter for other orthopedic aftercare: Secondary | ICD-10-CM | POA: Diagnosis not present

## 2024-04-28 DIAGNOSIS — S52592A Other fractures of lower end of left radius, initial encounter for closed fracture: Secondary | ICD-10-CM | POA: Diagnosis not present

## 2024-04-28 DIAGNOSIS — M25632 Stiffness of left wrist, not elsewhere classified: Secondary | ICD-10-CM | POA: Diagnosis not present

## 2024-04-28 DIAGNOSIS — Z4789 Encounter for other orthopedic aftercare: Secondary | ICD-10-CM | POA: Diagnosis not present

## 2024-05-10 DIAGNOSIS — M25632 Stiffness of left wrist, not elsewhere classified: Secondary | ICD-10-CM | POA: Diagnosis not present

## 2024-05-26 DIAGNOSIS — M25632 Stiffness of left wrist, not elsewhere classified: Secondary | ICD-10-CM | POA: Diagnosis not present

## 2024-05-30 DIAGNOSIS — S52592D Other fractures of lower end of left radius, subsequent encounter for closed fracture with routine healing: Secondary | ICD-10-CM | POA: Diagnosis not present

## 2024-06-03 DIAGNOSIS — I77810 Thoracic aortic ectasia: Secondary | ICD-10-CM | POA: Diagnosis not present

## 2024-06-03 DIAGNOSIS — I2510 Atherosclerotic heart disease of native coronary artery without angina pectoris: Secondary | ICD-10-CM | POA: Diagnosis not present

## 2024-06-14 DIAGNOSIS — M25632 Stiffness of left wrist, not elsewhere classified: Secondary | ICD-10-CM | POA: Diagnosis not present

## 2024-08-01 ENCOUNTER — Encounter

## 2024-08-09 ENCOUNTER — Encounter: Payer: BC Managed Care – PPO | Admitting: Dermatology

## 2025-02-06 ENCOUNTER — Ambulatory Visit: Admitting: Dermatology

## 2025-03-22 ENCOUNTER — Encounter: Admitting: Internal Medicine
